# Patient Record
Sex: Female | Born: 1945 | Race: White | Hispanic: No | Smoking: Former smoker
Health system: Southern US, Community
[De-identification: ages and names within clinical notes are randomized; demographics above are authoritative.]

## PROBLEM LIST (undated history)

## (undated) DIAGNOSIS — L309 Dermatitis, unspecified: Secondary | ICD-10-CM

## (undated) DIAGNOSIS — J449 Chronic obstructive pulmonary disease, unspecified: Secondary | ICD-10-CM

## (undated) DIAGNOSIS — R32 Unspecified urinary incontinence: Secondary | ICD-10-CM

## (undated) DIAGNOSIS — E785 Hyperlipidemia, unspecified: Secondary | ICD-10-CM

## (undated) DIAGNOSIS — E039 Hypothyroidism, unspecified: Secondary | ICD-10-CM

## (undated) DIAGNOSIS — I4892 Unspecified atrial flutter: Secondary | ICD-10-CM

## (undated) DIAGNOSIS — I4891 Unspecified atrial fibrillation: Secondary | ICD-10-CM

## (undated) HISTORY — PX: ABDOMINAL HYSTERECTOMY: SHX81

## (undated) HISTORY — PX: OOPHORECTOMY: SHX86

---

## 2021-05-07 ENCOUNTER — Other Ambulatory Visit: Payer: Self-pay

## 2021-05-07 ENCOUNTER — Inpatient Hospital Stay (HOSPITAL_COMMUNITY)
Admission: EM | Admit: 2021-05-07 | Discharge: 2021-05-10 | DRG: 308 | Disposition: A | Discharge status: Home / Self Care | Payer: Medicare Other | Attending: Internal Medicine | Admitting: Internal Medicine

## 2021-05-07 ENCOUNTER — Encounter (HOSPITAL_COMMUNITY): Payer: Self-pay | Admitting: Emergency Medicine

## 2021-05-07 ENCOUNTER — Emergency Department (HOSPITAL_COMMUNITY): Payer: Medicare Other

## 2021-05-07 DIAGNOSIS — W5503XA Scratched by cat, initial encounter: Secondary | ICD-10-CM

## 2021-05-07 DIAGNOSIS — I4819 Other persistent atrial fibrillation: Principal | ICD-10-CM | POA: Diagnosis present

## 2021-05-07 DIAGNOSIS — L309 Dermatitis, unspecified: Secondary | ICD-10-CM | POA: Diagnosis present

## 2021-05-07 DIAGNOSIS — Z825 Family history of asthma and other chronic lower respiratory diseases: Secondary | ICD-10-CM

## 2021-05-07 DIAGNOSIS — A6000 Herpesviral infection of urogenital system, unspecified: Secondary | ICD-10-CM | POA: Diagnosis present

## 2021-05-07 DIAGNOSIS — Z7901 Long term (current) use of anticoagulants: Secondary | ICD-10-CM

## 2021-05-07 DIAGNOSIS — J440 Chronic obstructive pulmonary disease with acute lower respiratory infection: Secondary | ICD-10-CM | POA: Diagnosis present

## 2021-05-07 DIAGNOSIS — E7800 Pure hypercholesterolemia, unspecified: Secondary | ICD-10-CM | POA: Diagnosis present

## 2021-05-07 DIAGNOSIS — I4891 Unspecified atrial fibrillation: Secondary | ICD-10-CM | POA: Diagnosis not present

## 2021-05-07 DIAGNOSIS — K5900 Constipation, unspecified: Secondary | ICD-10-CM | POA: Diagnosis present

## 2021-05-07 DIAGNOSIS — Z20822 Contact with and (suspected) exposure to covid-19: Secondary | ICD-10-CM | POA: Diagnosis present

## 2021-05-07 DIAGNOSIS — J189 Pneumonia, unspecified organism: Secondary | ICD-10-CM

## 2021-05-07 DIAGNOSIS — E785 Hyperlipidemia, unspecified: Secondary | ICD-10-CM | POA: Diagnosis present

## 2021-05-07 DIAGNOSIS — Z79899 Other long term (current) drug therapy: Secondary | ICD-10-CM

## 2021-05-07 DIAGNOSIS — Z880 Allergy status to penicillin: Secondary | ICD-10-CM

## 2021-05-07 DIAGNOSIS — Z888 Allergy status to other drugs, medicaments and biological substances status: Secondary | ICD-10-CM

## 2021-05-07 DIAGNOSIS — K219 Gastro-esophageal reflux disease without esophagitis: Secondary | ICD-10-CM | POA: Diagnosis present

## 2021-05-07 DIAGNOSIS — E039 Hypothyroidism, unspecified: Secondary | ICD-10-CM | POA: Diagnosis present

## 2021-05-07 DIAGNOSIS — Z79890 Hormone replacement therapy (postmenopausal): Secondary | ICD-10-CM

## 2021-05-07 DIAGNOSIS — I10 Essential (primary) hypertension: Secondary | ICD-10-CM | POA: Diagnosis present

## 2021-05-07 DIAGNOSIS — Z8249 Family history of ischemic heart disease and other diseases of the circulatory system: Secondary | ICD-10-CM

## 2021-05-07 DIAGNOSIS — S90812A Abrasion, left foot, initial encounter: Secondary | ICD-10-CM | POA: Diagnosis present

## 2021-05-07 DIAGNOSIS — Z7951 Long term (current) use of inhaled steroids: Secondary | ICD-10-CM

## 2021-05-07 DIAGNOSIS — Z91040 Latex allergy status: Secondary | ICD-10-CM

## 2021-05-07 DIAGNOSIS — I4892 Unspecified atrial flutter: Secondary | ICD-10-CM | POA: Diagnosis present

## 2021-05-07 DIAGNOSIS — I352 Nonrheumatic aortic (valve) stenosis with insufficiency: Secondary | ICD-10-CM | POA: Diagnosis present

## 2021-05-07 DIAGNOSIS — J441 Chronic obstructive pulmonary disease with (acute) exacerbation: Secondary | ICD-10-CM | POA: Diagnosis present

## 2021-05-07 DIAGNOSIS — Z87891 Personal history of nicotine dependence: Secondary | ICD-10-CM

## 2021-05-07 DIAGNOSIS — E559 Vitamin D deficiency, unspecified: Secondary | ICD-10-CM | POA: Diagnosis present

## 2021-05-07 HISTORY — DX: Unspecified atrial flutter: I48.92

## 2021-05-07 HISTORY — DX: Chronic obstructive pulmonary disease, unspecified: J44.9

## 2021-05-07 HISTORY — DX: Unspecified atrial fibrillation: I48.91

## 2021-05-07 LAB — CBC
HCT: 44.5 % (ref 36.0–46.0)
Hemoglobin: 14.1 g/dL (ref 12.0–15.0)
MCH: 29.2 pg (ref 26.0–34.0)
MCHC: 31.7 g/dL (ref 30.0–36.0)
MCV: 92.1 fL (ref 80.0–100.0)
Platelets: 315 10*3/uL (ref 150–400)
RBC: 4.83 MIL/uL (ref 3.87–5.11)
RDW: 14.1 % (ref 11.5–15.5)
WBC: 11.9 10*3/uL — ABNORMAL HIGH (ref 4.0–10.5)
nRBC: 0 % (ref 0.0–0.2)

## 2021-05-07 LAB — BASIC METABOLIC PANEL
Anion gap: 7 (ref 5–15)
BUN: 20 mg/dL (ref 8–23)
CO2: 24 mmol/L (ref 22–32)
Calcium: 9.7 mg/dL (ref 8.9–10.3)
Chloride: 106 mmol/L (ref 98–111)
Creatinine, Ser: 0.74 mg/dL (ref 0.44–1.00)
GFR, Estimated: >60 mL/min (ref >60)
Glucose, Bld: 90 mg/dL (ref 70–99)
Potassium: 5.2 mmol/L — ABNORMAL HIGH (ref 3.5–5.1)
Sodium: 137 mmol/L (ref 135–145)

## 2021-05-07 LAB — TROPONIN I (HIGH SENSITIVITY): Troponin I (High Sensitivity): 13 ng/L (ref <18)

## 2021-05-07 NOTE — ED Triage Notes (Signed)
Pt with hx of afib felt herself go into afib on Thursday. Endorses generalized weakness and shob with exertion.

## 2021-05-08 ENCOUNTER — Emergency Department (HOSPITAL_COMMUNITY): Payer: Medicare Other

## 2021-05-08 DIAGNOSIS — I4819 Other persistent atrial fibrillation: Secondary | ICD-10-CM | POA: Diagnosis not present

## 2021-05-08 DIAGNOSIS — I4891 Unspecified atrial fibrillation: Secondary | ICD-10-CM | POA: Diagnosis present

## 2021-05-08 LAB — CBC WITH DIFFERENTIAL/PLATELET
Abs Immature Granulocytes: 0.05 10*3/uL (ref 0.00–0.07)
Basophils Absolute: 0 10*3/uL (ref 0.0–0.1)
Basophils Relative: 0 %
Eosinophils Absolute: 0 10*3/uL (ref 0.0–0.5)
Eosinophils Relative: 0 %
HCT: 43.6 % (ref 36.0–46.0)
Hemoglobin: 14 g/dL (ref 12.0–15.0)
Immature Granulocytes: 1 %
Lymphocytes Relative: 7 %
Lymphs Abs: 0.7 10*3/uL (ref 0.7–4.0)
MCH: 29 pg (ref 26.0–34.0)
MCHC: 32.1 g/dL (ref 30.0–36.0)
MCV: 90.3 fL (ref 80.0–100.0)
Monocytes Absolute: 0.1 10*3/uL (ref 0.1–1.0)
Monocytes Relative: 1 %
Neutro Abs: 8.6 10*3/uL — ABNORMAL HIGH (ref 1.7–7.7)
Neutrophils Relative %: 91 %
Platelets: 284 10*3/uL (ref 150–400)
RBC: 4.83 MIL/uL (ref 3.87–5.11)
RDW: 14 % (ref 11.5–15.5)
WBC: 9.4 10*3/uL (ref 4.0–10.5)
nRBC: 0 % (ref 0.0–0.2)

## 2021-05-08 LAB — RESP PANEL BY RT-PCR (FLU A&B, COVID) ARPGX2
Influenza A by PCR: NEGATIVE
Influenza B by PCR: NEGATIVE
SARS Coronavirus 2 by RT PCR: NEGATIVE

## 2021-05-08 LAB — BASIC METABOLIC PANEL
Anion gap: 13 (ref 5–15)
BUN: 11 mg/dL (ref 8–23)
CO2: 21 mmol/L — ABNORMAL LOW (ref 22–32)
Calcium: 9.3 mg/dL (ref 8.9–10.3)
Chloride: 104 mmol/L (ref 98–111)
Creatinine, Ser: 0.59 mg/dL (ref 0.44–1.00)
GFR, Estimated: >60 mL/min (ref >60)
Glucose, Bld: 174 mg/dL — ABNORMAL HIGH (ref 70–99)
Potassium: 4.1 mmol/L (ref 3.5–5.1)
Sodium: 138 mmol/L (ref 135–145)

## 2021-05-08 LAB — BRAIN NATRIURETIC PEPTIDE: B Natriuretic Peptide: 106.1 pg/mL — ABNORMAL HIGH (ref 0.0–100.0)

## 2021-05-08 LAB — TSH: TSH: 1.366 u[IU]/mL (ref 0.350–4.500)

## 2021-05-08 LAB — HIV ANTIBODY (ROUTINE TESTING W REFLEX): HIV Screen 4th Generation wRfx: NONREACTIVE

## 2021-05-08 LAB — TROPONIN I (HIGH SENSITIVITY): Troponin I (High Sensitivity): 11 ng/L (ref <18)

## 2021-05-08 LAB — PROCALCITONIN: Procalcitonin: <0.1 ng/mL

## 2021-05-08 MED ORDER — ALBUTEROL (5 MG/ML) CONTINUOUS INHALATION SOLN
2.5000 mg | INHALATION_SOLUTION | RESPIRATORY_TRACT | Status: DC | PRN
  Filled 2021-05-08: qty 20

## 2021-05-08 MED ORDER — FLUTICASONE FUROATE-VILANTEROL 200-25 MCG/INH IN AEPB
1.0000 | INHALATION_SPRAY | Freq: Every day | RESPIRATORY_TRACT | Status: DC
  Administered 2021-05-09 – 2021-05-10 (×2): 1 via RESPIRATORY_TRACT
  Filled 2021-05-08: qty 28

## 2021-05-08 MED ORDER — UMECLIDINIUM BROMIDE 62.5 MCG/INH IN AEPB
1.0000 | INHALATION_SPRAY | Freq: Every day | RESPIRATORY_TRACT | Status: DC
  Administered 2021-05-09 – 2021-05-10 (×2): 1 via RESPIRATORY_TRACT
  Filled 2021-05-08: qty 7

## 2021-05-08 MED ORDER — SODIUM CHLORIDE 0.9 % IV SOLN
500.0000 mg | Freq: Once | INTRAVENOUS | Status: AC
  Administered 2021-05-08: 500 mg via INTRAVENOUS
  Filled 2021-05-08: qty 500

## 2021-05-08 MED ORDER — PANTOPRAZOLE SODIUM 40 MG PO TBEC
40.0000 mg | DELAYED_RELEASE_TABLET | Freq: Every day | ORAL | Status: DC
  Administered 2021-05-08 – 2021-05-10 (×3): 40 mg via ORAL
  Filled 2021-05-08 (×3): qty 1

## 2021-05-08 MED ORDER — LEVOTHYROXINE SODIUM 50 MCG PO TABS
50.0000 ug | ORAL_TABLET | Freq: Every day | ORAL | Status: DC
  Administered 2021-05-08 – 2021-05-09 (×2): 50 ug via ORAL
  Filled 2021-05-08 (×3): qty 1

## 2021-05-08 MED ORDER — DILTIAZEM HCL ER COATED BEADS 240 MG PO CP24
240.0000 mg | ORAL_CAPSULE | Freq: Every day | ORAL | Status: DC
  Administered 2021-05-08 – 2021-05-10 (×3): 240 mg via ORAL
  Filled 2021-05-08 (×3): qty 1

## 2021-05-08 MED ORDER — APIXABAN 5 MG PO TABS: 5.0000 mg | ORAL_TABLET | Freq: Two times a day (BID) | ORAL | Status: DC

## 2021-05-08 MED ORDER — LORAZEPAM 2 MG/ML IJ SOLN
0.5000 mg | Freq: Once | INTRAMUSCULAR | Status: AC
  Administered 2021-05-08: 0.5 mg via INTRAVENOUS
  Filled 2021-05-08: qty 1

## 2021-05-08 MED ORDER — LORAZEPAM 2 MG/ML IJ SOLN: 1.0000 mg | Freq: Once | INTRAMUSCULAR | Status: DC

## 2021-05-08 MED ORDER — ATORVASTATIN CALCIUM 10 MG PO TABS
10.0000 mg | ORAL_TABLET | Freq: Every day | ORAL | Status: DC
  Administered 2021-05-08 – 2021-05-10 (×3): 10 mg via ORAL
  Filled 2021-05-08 (×3): qty 1

## 2021-05-08 MED ORDER — ALBUTEROL SULFATE HFA 108 (90 BASE) MCG/ACT IN AERS
2.0000 | INHALATION_SPRAY | Freq: Once | RESPIRATORY_TRACT | Status: AC
  Administered 2021-05-08: 2 via RESPIRATORY_TRACT
  Filled 2021-05-08: qty 6.7

## 2021-05-08 MED ORDER — SODIUM CHLORIDE 0.9 % IV SOLN
1.0000 g | Freq: Once | INTRAVENOUS | Status: AC
  Administered 2021-05-08: 1 g via INTRAVENOUS
  Filled 2021-05-08: qty 10

## 2021-05-08 MED ORDER — APIXABAN 5 MG PO TABS
5.0000 mg | ORAL_TABLET | Freq: Two times a day (BID) | ORAL | Status: DC
  Administered 2021-05-08 – 2021-05-10 (×5): 5 mg via ORAL
  Filled 2021-05-08 (×6): qty 1

## 2021-05-08 MED ORDER — MONTELUKAST SODIUM 10 MG PO TABS
10.0000 mg | ORAL_TABLET | Freq: Every day | ORAL | Status: DC
  Administered 2021-05-08 – 2021-05-09 (×2): 10 mg via ORAL
  Filled 2021-05-08 (×2): qty 1

## 2021-05-08 MED ORDER — PREDNISONE 20 MG PO TABS
40.0000 mg | ORAL_TABLET | Freq: Once | ORAL | Status: AC
  Administered 2021-05-08: 40 mg via ORAL
  Filled 2021-05-08: qty 2

## 2021-05-08 MED ORDER — IOHEXOL 350 MG/ML SOLN
75.0000 mL | Freq: Once | INTRAVENOUS | Status: AC
  Administered 2021-05-08: 75 mL via INTRAVENOUS

## 2021-05-08 MED ORDER — LOSARTAN POTASSIUM 50 MG PO TABS
50.0000 mg | ORAL_TABLET | Freq: Every day | ORAL | Status: DC
  Administered 2021-05-08 – 2021-05-10 (×3): 50 mg via ORAL
  Filled 2021-05-08 (×3): qty 1

## 2021-05-08 NOTE — ED Notes (Signed)
Pt transported to ct at this time 

## 2021-05-08 NOTE — Evaluation (Signed)
Physical Therapy Evaluation Patient Details Name: Christine Moore MRN: [id number] DOB: Apr 03, 1946 Today's Date: 05/08/2021   History of Present Illness  Pt is a 75 y/o female admitted secondary to SOB and afib. Chest imaging also revealing likely PNA. PMH includes COPD and a fib.  Clinical Impression  Pt admitted secondary to problem above with deficits below. Noted to have unsteadiness with short distance ambulation in room requiring min to min guard A for stability. Pt reports she has also been having issues with navigating curb steps. Feel she would benefit from outpatient PT for balance. Pt also mentioning urinary issues and pelvic health issues that she feels trips to the MD have not helped with. Would also likely benefit from pelvic health PT follow up. Will continue to follow acutely to maximize functional mobility independence and safety.     Follow Up Recommendations Outpatient PT (would benefit from outpatient PT for balance and pelvic health PT)    Equipment Recommendations  3in1 (PT)    Recommendations for Other Services       Precautions / Restrictions Precautions Precautions: Fall Restrictions Weight Bearing Restrictions: No      Mobility  Bed Mobility Overal bed mobility: Needs Assistance Bed Mobility: Supine to Sit;Sit to Supine     Supine to sit: Supervision Sit to supine: Supervision   General bed mobility comments: Supervision for safety and line management.    Transfers Overall transfer level: Needs assistance Equipment used: 1 person hand held assist Transfers: Sit to/from Stand Sit to Stand: Min assist         General transfer comment: Min A for steadying assist to stand from stretcher.  Ambulation/Gait Ambulation/Gait assistance: Min assist;Min guard Gait Distance (Feet): 8 Feet Assistive device: 1 person hand held assist Gait Pattern/deviations: Step-through pattern;Decreased stride length Gait velocity: Decreased   General Gait Details:  Ambulated short distance within ED room. Unsteady and requiring min to min guard A.  Stairs            Wheelchair Mobility    Modified Rankin (Stroke Patients Only)       Balance Overall balance assessment: Needs assistance Sitting-balance support: No upper extremity supported;Feet supported Sitting balance-Leahy Scale: Fair     Standing balance support: Single extremity supported;No upper extremity supported;During functional activity Standing balance-Leahy Scale: Fair Standing balance comment: Able to maintain static standing without UE support                             Pertinent Vitals/Pain Pain Assessment: No/denies pain    Home Living Family/patient expects to be discharged to:: Private residence Living Arrangements: Alone Available Help at Discharge: Family;Available PRN/intermittently Type of Home: House Home Access: Stairs to enter   Entrance Stairs-Number of Steps: 1 threshold Home Layout: One level Home Equipment: Walker - 2 wheels;Walker - 4 wheels;Wheelchair - power      Prior Function Level of Independence: Independent               Hand Dominance        Extremity/Trunk Assessment   Upper Extremity Assessment Upper Extremity Assessment: Defer to OT evaluation    Lower Extremity Assessment Lower Extremity Assessment: Generalized weakness    Cervical / Trunk Assessment Cervical / Trunk Assessment: Normal  Communication   Communication: No difficulties  Cognition Arousal/Alertness: Awake/alert Behavior During Therapy: WFL for tasks assessed/performed Overall Cognitive Status: Within Functional Limits for tasks assessed  General Comments General comments (skin integrity, edema, etc.): Pt with questions about pelvic health PT as she has urinary issues and does not feel MD is helping.    Exercises     Assessment/Plan    PT Assessment Patient needs continued PT  services  PT Problem List Decreased strength;Decreased balance;Decreased activity tolerance;Decreased mobility;Decreased knowledge of use of DME;Decreased knowledge of precautions       PT Treatment Interventions DME instruction;Gait training;Functional mobility training;Stair training;Therapeutic exercise;Therapeutic activities;Balance training;Patient/family education    PT Goals (Current goals can be found in the Care Plan section)  Acute Rehab PT Goals Patient Stated Goal: to feel better PT Goal Formulation: With patient Time For Goal Achievement: 05/22/21 Potential to Achieve Goals: Good    Frequency Min 3X/week   Barriers to discharge        Co-evaluation               AM-PAC PT "6 Clicks" Mobility  Outcome Measure Help needed turning from your back to your side while in a flat bed without using bedrails?: None Help needed moving from lying on your back to sitting on the side of a flat bed without using bedrails?: A Little Help needed moving to and from a bed to a chair (including a wheelchair)?: A Little Help needed standing up from a chair using your arms (e.g., wheelchair or bedside chair)?: A Little Help needed to walk in hospital room?: A Little Help needed climbing 3-5 steps with a railing? : A Little 6 Click Score: 19    End of Session   Activity Tolerance: Patient tolerated treatment well Patient left: in bed;with call bell/phone within reach;with family/visitor present (on stretcher in ED) Nurse Communication: Mobility status PT Visit Diagnosis: Unsteadiness on feet (R26.81);Muscle weakness (generalized) (M62.81)    Time: 4332-9518 PT Time Calculation (min) (ACUTE ONLY): 16 min   Charges:   PT Evaluation $PT Eval Low Complexity: 1 Low          Cindee Salt, DPT  Acute Rehabilitation Services  Pager: [phone] Office: [phone]   Lehman Prom 05/08/2021, 2:52 PM

## 2021-05-08 NOTE — Plan of Care (Signed)
  Problem: Education: Goal: Knowledge of General Education information will improve Description: Including pain rating scale, medication(s)/side effects and non-pharmacologic comfort measures Outcome: Progressing   Problem: Clinical Measurements: Goal: Ability to maintain clinical measurements within normal limits will improve Outcome: Progressing Goal: Will remain free from infection Outcome: Progressing   

## 2021-05-08 NOTE — ED Provider Notes (Signed)
[facility] EMERGENCY DEPARTMENT Provider Note   CSN: [id number] Arrival date & time: 05/07/21  1547     History Chief Complaint  Patient presents with  . Atrial Fibrillation    Christine Moore is a 74 y.o. female.  Christine Moore is a 75 y.o. female with a history of A. fib and COPD, who presents to the emergency department for evaluation of A. fib, fatigue and shortness of breath.  Patient reports she has been under a lot of stress recently, a few months ago she lost her husband and recently had to move here to be with her sister from her home in [place].  She reports she moved last week and with all the stress on Thursday she felt like she flipped into A. fib.  She reports she always feels very unwell when she is in A. fib and has fatigue and shortness of breath.  She reports she has felt irregular heartbeat but has not felt like her heart is racing.  She denies any associated chest pain.  No lightheadedness or syncope.  She does report that she has had a cough but this has been chronic for a while and intermittently productive.  She denies any fevers.  Reports she has had a significant history with A. fib, had a cardiologist that she followed with in [place], has had 4 ablations and has tried 2 different antiarrhythmics but has still intermittently flipped into A. fib, she had been doing well for a while until this recent episode of stress.  The history is provided by the patient.       Past Medical History:  Diagnosis Date  . Atrial fibrillation (HCC)   . Atrial flutter (HCC)   . COPD (chronic obstructive pulmonary disease) (HCC)     There are no problems to display for this patient.      OB History   No obstetric history on file.     No family history on file.     Home Medications Prior to Admission medications   Not on File    Allergies    Penicillins, Quinapril, Formaldehyde, and Other  Review of Systems   Review of Systems   Constitutional: Positive for fatigue. Negative for chills and fever.  HENT: Negative.   Respiratory: Positive for cough and shortness of breath.   Cardiovascular: Negative for chest pain and leg swelling.  Gastrointestinal: Negative for abdominal pain, nausea and vomiting.  Genitourinary: Negative for dysuria and frequency.  Musculoskeletal: Negative for arthralgias and myalgias.  Skin: Negative for color change and rash.  Neurological: Negative for dizziness, syncope, weakness and light-headedness.  All other systems reviewed and are negative.   Physical Exam Updated Vital Signs BP (!) 181/157   Pulse 86   Temp 97.9 F (36.6 C) (Oral)   Resp (!) 32   Ht 5\' 9"  (1.753 m)   Wt 80.3 kg   SpO2 99%   BMI 26.14 kg/m   Physical Exam Vitals and nursing note reviewed.  Constitutional:      General: She is not in acute distress.    Appearance: Normal appearance. She is well-developed. She is not ill-appearing or diaphoretic.     Comments: Well-appearing, no distress  HENT:     Head: Normocephalic and atraumatic.  Eyes:     General:        Right eye: No discharge.        Left eye: No discharge.  Cardiovascular:     Rate and Rhythm:  Normal rate. Rhythm irregular.     Pulses: Normal pulses.     Heart sounds: Normal heart sounds. No murmur heard. No friction rub.     Comments: Irregularly irregular but rate controlled Pulmonary:     Effort: Pulmonary effort is normal. No respiratory distress.     Breath sounds: Wheezing present. No rales.     Comments: Respirations are equal and unlabored, able to speak in full sentences, on auscultation patient has some faint expiratory wheezes, no rales or rhonchi.  Breath sounds slightly diminished Abdominal:     General: Bowel sounds are normal. There is no distension.     Palpations: Abdomen is soft. There is no mass.     Tenderness: There is no abdominal tenderness. There is no guarding.     Comments: Abdomen soft, nondistended, nontender  to palpation in all quadrants without guarding or peritoneal signs  Musculoskeletal:        General: No deformity.     Cervical back: Neck supple.     Right lower leg: No edema.     Left lower leg: No edema.  Skin:    General: Skin is warm and dry.     Capillary Refill: Capillary refill takes less than 2 seconds.  Neurological:     Mental Status: She is alert and oriented to person, place, and time.     Coordination: Coordination normal.     Comments: Speech is clear, able to follow commands Moves extremities without ataxia, coordination intact  Psychiatric:        Mood and Affect: Mood normal.        Behavior: Behavior normal.     ED Results / Procedures / Treatments   Labs (all labs ordered are listed, but only abnormal results are displayed) Labs Reviewed  BASIC METABOLIC PANEL - Abnormal; Notable for the following components:      Result Value   Potassium 5.2 (*)    All other components within normal limits  CBC - Abnormal; Notable for the following components:   WBC 11.9 (*)    All other components within normal limits  RESP PANEL BY RT-PCR (FLU A&B, COVID) [id number]  BRAIN NATRIURETIC PEPTIDE  TROPONIN I (HIGH SENSITIVITY)  TROPONIN I (HIGH SENSITIVITY)    EKG EKG Interpretation  Date/Time:  Tuesday May 07 2021 15:53:04 EDT Ventricular Rate:  75 PR Interval:    QRS Duration: 88 QT Interval:  396 QTC Calculation: 442 R Axis:   86 Text Interpretation: Atrial fibrillation Abnormal QRS-T angle, consider primary T wave abnormality Abnormal ECG No old tracing to compare Confirmed by Drema Pryardama, Pedro [phone]([id number]) on 05/08/2021 2:04:02 AM   Radiology DG Chest 2 View  Result Date: 05/07/2021 CLINICAL DATA:  Shortness of breath. EXAM: CHEST - 2 VIEW COMPARISON:  None. FINDINGS: The heart size and mediastinal contours are within normal limits. No pneumothorax or pleural effusion is noted. Mild bibasilar subsegmental atelectasis is noted. Rounded density is seen in right infrahilar  region concerning for possible mass or neoplasm. The visualized skeletal structures are unremarkable. IMPRESSION: Mild bibasilar subsegmental atelectasis. Rounded density seen in right infrahilar region concerning for possible neoplasm or mass. CT scan of the chest with intravenous contrast is recommended. Electronically Signed   By: Lupita RaiderJames  Green Jr M.D.   On: 05/07/2021 16:29   CT Angio Chest PE W and/or Wo Contrast  Result Date: 05/08/2021 CLINICAL DATA:  Chest pain and shortness of breath. EXAM: CT ANGIOGRAPHY CHEST WITH CONTRAST TECHNIQUE: Multidetector CT imaging of the  chest was performed using the standard protocol during bolus administration of intravenous contrast. Multiplanar CT image reconstructions and MIPs were obtained to evaluate the vascular anatomy. CONTRAST:  27mL OMNIPAQUE IOHEXOL 350 MG/ML SOLN COMPARISON:  None. FINDINGS: Cardiovascular: Satisfactory opacification of the pulmonary arteries to the segmental level. No evidence of pulmonary embolism. Enlarged heart size. No pericardial effusion. 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 Mediastinum/Nodes: No enlarged mediastinal, hilar, or axillary lymph nodes. Thyroid gland, trachea, and esophagus demonstrate no significant findings. Shotty mediastinal lymph, sub pathologic by CT criteria. Lungs/Pleura: No evidence of pleural effusion or pneumothorax. 4 mm right upper lobe perifissural pulmonary nodule, image 51/101, lung windows. Patchy bilateral lower lobe airspace consolidation, more confluent in the left lower lobe. Consolidation versus atelectasis in the medial segment of the right middle lobe. More subtle scattered ground-glass opacities present in the upper lobes. Upper Abdomen: No acute abnormality. Musculoskeletal: No chest wall abnormality. No acute or significant osseous findings. Review of the MIP images confirms the above findings. IMPRESSION: 1. No evidence of pulmonary embolus. 2. Patchy bilateral lower  lobe airspace consolidation, more confluent in the left lower lobe. Consolidation versus atelectasis in the medial segment of the right middle lobe. More subtle scattered ground-glass opacities in the upper lobes. These findings may represent multifocal pneumonia. 3. 4 mm right upper lobe perifissural pulmonary nodule. No follow-up needed if patient is low-risk. Non-contrast chest CT can be considered in 12 months if patient is high-risk. This recommendation follows the consensus statement: Guidelines for Management of Incidental Pulmonary Nodules Detected on CT Images: From the Fleischner Society 2017; Radiology 2017; 284:228-243. 4. Enlarged heart size. 5. 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 Aortic Atherosclerosis (ICD10-I70.0). Electronically Signed   By: Ted Mcalpine M.D.   On: 05/08/2021 09:32    Procedures Procedures   Medications Ordered in ED Medications  apixaban (ELIQUIS) tablet 5 mg (has no administration in time range)  diltiazem (CARDIZEM CD) 24 hr capsule 240 mg (has no administration in time range)  albuterol (PROVENTIL,VENTOLIN) solution continuous neb (has no administration in time range)  fluticasone furoate-vilanterol (BREO ELLIPTA) 200-25 MCG/INH 1 puff (has no administration in time range)  umeclidinium bromide (INCRUSE ELLIPTA) 62.5 MCG/INH 1 puff (has no administration in time range)  predniSONE (DELTASONE) tablet 40 mg (40 mg Oral Given 05/08/21 0748)  albuterol (VENTOLIN HFA) 108 (90 Base) MCG/ACT inhaler 2 puff (2 puffs Inhalation Given 05/08/21 0748)  iohexol (OMNIPAQUE) 350 MG/ML injection 75 mL (75 mLs Intravenous Contrast Given 05/08/21 0832)  LORazepam (ATIVAN) injection 0.5 mg (0.5 mg Intravenous Given 05/08/21 0747)  cefTRIAXone (ROCEPHIN) 1 g in sodium chloride 0.9 % 100 mL IVPB (0 g Intravenous Stopped 05/08/21 1032)  azithromycin (ZITHROMAX) 500 mg in sodium chloride 0.9 % 250 mL IVPB (0 mg Intravenous Stopped 05/08/21 1159)    ED  Course  I have reviewed the triage vital signs and the nursing notes.  Pertinent labs & imaging results that were available during my care of the patient were reviewed by me and considered in my medical decision making (see chart for details).    MDM Rules/Calculators/A&P                         75 y.o. female presents to the ED with complaints of A. fib, shortness of breath and fatigue, this involves an extensive number of treatment options, and is a complaint that carries with it a high risk of complications and  morbidity.  The differential diagnosis includes A. fib, CHF, pulmonary edema, pneumonia, COVID, ACS, PE, COPD  On arrival pt is nontoxic, vitals reassuring, patient is hypertensive, EKG consistent with A. fib but rate controlled.  On exam patient with some faint scattered wheezing noted.  No lower extremity edema.  Additional history obtained from medical records. Previous records obtained and reviewed EMR  Lab Tests:  I Ordered, reviewed, and interpreted labs, which included:  CBC: Mild leukocytosis, normal hemoglobin BMP: Minimally elevated potassium of 5.2, all other electrolytes unremarkable, normal renal function Troponin: Negative x2 BNP: No significant elevation at 106.1 COVID/flu: Negative  Imaging Studies ordered:  I ordered imaging studies which included CXR, CT PE Study, I independently visualized and interpreted imaging which showed: CXR- Mild bibasilar subsegmental atelectasis. Rounded density seen in right infrahilar region concerning for possible neoplasm or mass. CT with contrast recommended for further eval  CTA - No evidence of pulmonary embolus. Patchy bilateral lower lobe airspace consolidation, more confluent in the left lower lobe. Consolidation versus atelectasis in the medial segment of the right middle lobe. More subtle scattered ground-glass opacities in the upper lobes. These findings may represent multifocal pneumonia. 4 mm right upper lobe perifissural  pulmonary nodule  Will initiate antibiotics for multifocal pneumonia, this could have triggered patient to flip back into A. fib in addition to current stressors.  ED Course:   Critical interventions: IV antibiotics for multifocal pneumonia  Patient symptomatic with A. fib, fortunately currently rate controlled, known history of A. fib but has had difficulty with cardioversion previously, history of 4 prior ablations and multiple antiarrhythmics.  Felt like she flipped back into it on Thursday and is very symptomatic when in A. fib.  Also found to have multifocal pneumonia which could also be attributing to shortness of breath and fatigue.  Feel patient will need IV antibiotics for pneumonia and given A. fib that has been difficult to control previously will have cardiology see patient.  Discussed case with cardiology, they will have someone with EP evaluate patient while admitted  I consulted and discussed lab and imaging findings with internal medicine teaching service, they will see and admit the patient.   Portions of this note were generated with Scientist, clinical (histocompatibility and immunogenetics). Dictation errors may occur despite best attempts at proofreading.   Final Clinical Impression(s) / ED Diagnoses Final diagnoses:  Multifocal pneumonia  Atrial fibrillation, unspecified type [facility])    Rx / DC Orders ED Discharge Orders    None       Legrand Rams 05/08/21 1307    Margarita Grizzle, MD 05/08/21 1325

## 2021-05-08 NOTE — H&P (Addendum)
Date: 05/08/2021               Patient Name:  Christine Moore MRN: [id number]  DOB: [birth year] Age / Sex: 75 y.o., female   PCP: Pcp, No              Medical Service: Internal Medicine Teaching Service              Attending Physician: Dr. Inez CatalinaMullen, Emily B, MD    First Contact: Janeal HolmesBranden Mabe, MS3 Pager: [phone]  Second Contact: Dr. Karsten RoVandana Doda Pager: [phone]  Third Contact Dr. Thurmon FairJeff Steen Pager: [phone]        After Hours (After 5p/  First Contact Pager: [phone]  weekends / holidays): Second Contact Pager: [phone]   Chief Complaint: Fatigue, weakness, and dyspnea on exertion  History of Present Illness:  Christine Moore is a 75 y.o. female who presents with progressive fatigue, weakness, and dyspnea on exertion since last Saturday. She has also noticed a cough that is productive of sputum. She mentioned she was in her usual state of health until that time. She does not recall any inciting event. On Sunday, she went to urgent care for evaluation of these symptoms. She was told that she was having a COPD exacerbation as well as symptoms due to her atrial fibrilliation. Since her visit, she has had worsening symptoms of dyspnea and fatigue which prompted her to come to ED for further evaluation.     She mentions that she has had intermittent episodes of fatigue and weakness about twice a week for decades due to history of atrial fibrillation and COPD. These episodes tend to happen twice per week and "put her out" in bed all day. She also states she has had multiple episodes of pneumonia in her life. She mentions that her husband passed away in March. [place] this weekend to be with her sister and also due to high cost of living without her husband. She mentions undergoing a lot of stress due to these events which appeared to have exacerbated her A.fib symptoms.   She describes a history of multiple interventions for A.fib including multiple ablations and  cardioversions. She mentions having improvement in her symptoms over the last year until these recent changes. She denies any fever, chest pain, nausea, vomiting, constipation, or diarrhea out of her normal bowel habits. She does endorse some chilling and feeling cold. There have been no sick contacts that she is aware of. She does not have a PCP here in [place] since she just moved here.  Meds: Levothyroxine 50mcg daily Trelegy ellipta 200-62.5-25mcg 1 puff daily Acyclovir 400mg  PRN Atorvastatin 10mg  qhs Eliquis 5mg  BID Losartan 50mg  daily Montelukast 10mg  daily Diltiazem 24hr 240mg  daily Pantoprazole 40mg  daily Xopenex 1-2 puffs PRN Vitamin D 800 units BID Lutein-zeasanthin 25-5mg  daily Chlorpheniramine 4mg  qhs Clobetasol 0.05% BID PRN Psyllium 2g BID  Allergies: Allergies as of 05/07/2021 - Review Complete 05/07/2021  Allergen Reaction Noted  . Penicillins Swelling 05/16/2011  . Quinapril Swelling 05/16/2011  . Formaldehyde Itching 03/02/2018  . Other Rash 08/16/2018  Latex, itching  Past Medical History:  Diagnosis Date  . Atrial fibrillation (HCC)   . Atrial flutter (HCC)   . COPD (chronic obstructive pulmonary disease) (HCC)   Eczema Asthma Herpes simplex Hypertension Hyperlipidemia Vitamin D deficiency GERD  Past Surgical History Four cardiac ablations Cataract surgery "Ear surgery"  Family History: Mother: deceased at 4484 from asthma complications, asthma, several heart attacks, diabetes, high  blood pressure, high cholesterol. Father: deceased at 8 from AAA, no other known health problems Older sister: deceased from melanoma in 81s, ovarian cancer diagnosed in 71s and melanoma diagnosed in 32s Second sister: living in 105s, breast cancer diagnosed in 13s, peritoneal cancer diagnosed in 32s Third sister: living in 49s, cervical cancer diagnosed in 56s  Social History: Patient recently moved from senior living facility at [facility]. Husband passed away  March 10, 2023. Currently living alone at home but lives near her sister and brother-in-law. Currently retired. Used to work as a Psychologist, forensic in social studies / Nurse, children's. Does knitting in her free time. Drinks about 2-3 drinks ([place] + Ginger ale) daily. Used to smoke 1.5 pack daily for 15-20 years but quit 30 years prior. Denies any prior illicit substance use. Able to perform ADLs, IADLs at home. She reports no barriers to obtaining or using her medications.  Review of Systems: On review of systems, she denies any fevers. Does endorse chills. Eyesight worse after cataract surgery. Her hearing has been stable with hearing aids. She denies any chest pain. She denies any abdominal pain, nausea, vomiting, constipation. She endorses diarrhea that is unchanged from usual. She denies any dysuria. Does endorse urinary frequency that has been ongoing for couple years. Mentions some chronic issue with "balance." She endorses generalized weakness. Does endorse solar purpura diffusely over time.   Physical Exam: Blood pressure (!) 154/72, pulse 75, temperature 97.9 F (36.6 C), temperature source Oral, resp. rate (!) 25, height 5\' 9"  (1.753 m), weight 80.3 kg, SpO2 95 %.  GEN: well developed, laying in bed in NAD, sister at bedside HEAD: NCAT, neck supple  EENT: PERRL, prominent brow ridge, moderate ptosis of the L eye, MMM without erythema, lesions, or exudates CVS: Irregularly irregular rhythm, normal S1/S2, no murmurs, rubs, gallops  RESP: Breathing comfortably on RA, inspiratory wheezes in bilateral anterior lung fields, decreased breath sounds in inferior posterior lung fields ABD: Soft, non-tender, no organomegaly or masses SKIN: Solar purpura present on bilateral upper extremities and back diffusely. Scratch with overlying scab and mild bordering erythema is present on left medial calcaneous. NEU:CN II-XII intact, strength 5/5 and sensation normal throughout, +2 patellar and biceps  reflexes, heel-to-shin and finger-to-nose exams normal. PSY: Mood and affect normal. Thought content and behavior normal.  EKG: personally reviewed my interpretation is irregularly irregular rhythm with sawtooth waves consistent with atrial flutter  CXR Impression:  Mild bibasilar subsegmental atelectasis. Rounded density seen in right infrahilar region concerning for possible neoplasm or mass. CT scan of the chest with intravenous contrast is recommended.  CTA Chest Impression:  1. No evidence of pulmonary embolus. 2. Patchy bilateral lower lobe airspace consolidation, more confluent in the left lower lobe. Consolidation versus atelectasis in the medial segment of the right middle lobe. More subtle scattered ground-glass opacities in the upper lobes. These findings may represent multifocal pneumonia. 3. 4 mm right upper lobe perifissural pulmonary nodule. No follow-up needed if patient is low-risk. Non-contrast chest CT can be considered in 12 months if patient is high-risk. This recommendation follows the consensus statement: Guidelines for Management of Incidental Pulmonary Nodules Detected on CT Images: From the Fleischner Society 2017; Radiology 2017; 284:228-243. 4. Enlarged heart size. 5. 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  Assessment & Plan by Problem: Active Problems:   Atrial fibrillation (HCC)  1. COPD exacerbation, history of asthma Patient has experienced progressive DOE, cough with sputum production, fatigue, and weakness over the last couple of  days. She states she has had many episodes like this before over the last decade. She takes Trelegy, xopenex, montelukast at home. She is afebrile and in NAD on exam. CXR demonstrates mild bibasilar subsegmental atelectasis. CTA demonstrates patchy bilateral lower lobe consolidation, consolidation vs atelectasis in right middle lobe, scattered ground-glass opacities in upper lobes, and right  upper lobe nodule. Past CXR and CT of the chest yielded similar findings besides the right upper lobe nodule. Given the chronicity of these symptoms and similar imaging, suspect presentation is an exacerbation of existing COPD. Because of her 30 pack year smoking history and pulmonary nodule findings, will recommend outpatient follow up within 12 months for further evaluation. -Albuterol neubulizer 2.5 mg Q4H for wheezing, SOB -Fluticasone furoate-vilanterol 200-25 mcg one puff daily -Umeclidinium bromide 62.5 mcg one puff daily -Montelukast 10 mg daily -2L O2 nasal cannula -Incentive spirometry  2. Persistent Atrial fibrillation Patient has history of atrial fibrillation and takes diltiazem, eliqiuis at home. Her ECG demonstrates active atrial flutter. Irregularly irregular rhythm on exam. She noted multiple episodes of symptomatic A.fib over the last decade. She does not have a cardiologist here since she just moved. Will get cardiology consult for further recommendations. -Continue diltiazem 240 mg daily -Continue eliquis 5 mg daily -Consult cardiology -Cardiac diet  3. Cat scratch Patient reports current cat scratch on her left medial calcaneous that is healing. She has been taking Keflex for the scratch. Keflex was stopped after starting Rocephin for presumed pneumonia due to adequate coverage. Antibiotics have been stopped due to likely COPD exacerbation. It appears to be healing so will discontinue antibiotic therapy completely. -Discontinue Rocephin -Monitor  4. Balance concerns Patient endorses balance concerns at home. She overall says she has no difficulty completing IADLs and ADLs, however. She lives near her sister and brother-in-law and has a good support system here in [place]. Given age and possibility of falls with balance issues, will consult PT/OT. -PT/OT evaluation  5. 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 She takes acyclovir at home. -Continue  acyclovir 400 mg 3 times daily prn  6. HLD Patient reports taking atorvastatin at home for high cholesterol. -Continue atorvastatin 10 mg daily  7. Seasonal allergies Patient states she takes chlorpheniramine for allergies. -Continue chlorpheniramine 4 mg BID prn  8. Vitamin D deficiency Patient takes cholecalciferol 10 mcg/400 units BID -Continue supplement  9. Eczema Patient reports history of eczema and takes clobetasol ointment. -Continue clobetasol 0.05% daily prn  10. Hypothyroidism Patient states she takes levothyroxine daily. Dose was originally written for 75 mcg; however, she states that it made her feel "jittery" and now only takes 50 mcg daily. -Continue levothyroxine  11. Hypertension Patient takes losartan 50 mg daily for hypertension. -Continue losartan  12. Constipation Patient takes fiber supplements to help bowels. -Continue calcium polycarbophil 1 capsule daily  13. GERD Patient is taking pantoprazole at home. -Continue pantoprazole 40 mg daily  14. History of cataracts Patient takes lutein-zeaxanthin 25-5 mg capsules for her eyesight. -Continue supplement  Dispo: Admit patient to Observation with expected length of stay less than 2 midnights.  Signed: Samantha Moore, Medical Student 05/08/2021, 11:25 AM  Pager: [phone] After 5pm on weekdays and 1pm on weekends: On Call pager [phone]  Attestation for Student Documentation I personally was present and performed or re-performed the history, physical exam and medical decision-making activities of this service and have verified that the service and findings are accurately documented in the student's note  Christine Moore is a 75  yo F w/ PMH of hypothyroidism, persistent A.fib/flutter, COPD/Asthma and eczema presenting to [facility] w/ subjective fatigue. Patient feels this is due to her A.fib but hr in 60s, bp stable. hsTrop negative. BNP close to normal levels. Chest imaging suggest possible pneumonia but chart  review shows these are chronic findings. No desaturation event or significant wheezing to suggest COPD exacerbation. Suspect non-organic cause for her symptoms but will admit for observation while undergoing work-up for CHF, bacterial pneumonia and hypothyroidism.  -Judeth Cornfield, PGY3 Pager: [phone]

## 2021-05-08 NOTE — ED Notes (Signed)
Provider at bedside

## 2021-05-08 NOTE — Consult Note (Addendum)
Cardiology Consultation:   Patient ID: Christine Moore MRN: [id number]; DOB: 06/25/1946  Admit date: 05/07/2021 Date of Consult: 05/08/2021  PCP:  Oneita Hurt No   [facility] Providers Cardiologist:  New to [facility]  Previously: Loney Loh, MD  [address]  [place], [place] [address]   Patient Profile:   Christine Moore is a 74 y.o. female with a hx of paroxysmal atrial fibrillation/flutter, HTN, HLD and COPD/asthma who is being seen 05/08/2021 for the evaluation of afib at the request of Dr. Criselda Peaches.   Per primary cardiologist note "history of symptomatic paroxysmal and persistent atrial fibrillation and has had 3 ablations: [facility], pulmonary vein cryoablation June 2015 by Dr Katrinka Blazing, and 3rd ablation in April of 2019 with surgical posterior wall left atrial ablation, endocardial repeat pulmonary vein isolation and tricuspid isthmus ablation (in the setting of flecainide therapy, she had documented typical atrial flutter). In July, 2019 she presented with symptomatic atypical atrial flutter/atrial tachycardia, requiring cardioversion. She had some side effects of worsening reflux symptoms on flecainide, and was transitioned to propafenone." Since that time her propafenone was discontinued due to recurrence of atrial flutter while on this medication, and Dr. Katrinka Blazing was concerned that she might develop permanent atrial fibrillation may need pacemaker +/-AV node ablation. However in June of 2021 she were cardiac monitor that showed normal sinus rhythm and no further significant runs of atrial fibrillation".  Last echo 03/2018 showed normal LVEF, moderate MR and moderate AR.   Patient was in rate controlled atrial flutter when seen last 01/09/2021.   History of Present Illness:   Christine Moore is in lots of stress for post 6 months due to declining health of her husband who eventually passed away on March 02, 2023.  She finally decided to  move closer to her sister.  Last week patient noted unusual fatigue and more than usual cough.  She knew she was out of rhythm and noted irregular pulse.  However, she did not pay much attention as she was busy packing her stuff and eventually moved to [place], [place] last Friday.  Her symptoms persisted and evaluated at urgent care.  She was diagnosed with COPD exacerbation and started on Keflex for cat scratch. However, symptoms worsen with productive cough and intermittent chills without fever.  Reported exertional chest pressure and shortness of breath which were her prior symptoms when she goes out of rhythm.  Due to persistent symptoms she presented to emergency from yesterday around 4 PM and seen this morning. She has missed PM dose of Eliquis while waiting in ER.   Hs-troponin 13>>11 BNP 106 K 5.2 Scr 1.74 Respiratory panel negative for COVID and influenza   Chest X-ray Mild bibasilar subsegmental atelectasis. Rounded density seen inright infrahilar region concerning for possible neoplasm or mass. CT scan of the chest with intravenous contrast is recommended.  CTA of chest without PE but likely multifocal pneumonia. 4 mm right upper lobe perifissural pulmonary nodule. 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   Past Medical History:  Diagnosis Date  . Atrial fibrillation (HCC)   . Atrial flutter (HCC)   . COPD (chronic obstructive pulmonary disease) (HCC)     Inpatient Medications: Scheduled Meds: . apixaban  5 mg Oral BID  . diltiazem  240 mg Oral Daily  . fluticasone furoate-vilanterol  1 puff Inhalation Daily  . umeclidinium bromide  1 puff Inhalation Daily   Continuous Infusions:  PRN Meds: albuterol  Allergies:    Allergies  Allergen Reactions  . Penicillins Swelling    Unable to recall  Swelling not throat/face   . Quinapril Swelling    Swelling of face Swelling of face   . Latex Itching  . Formaldehyde Itching  . Other  Rash    Social History:   Social History   Socioeconomic History  . Marital status: Widowed    Spouse name: Not on file  . Number of children: Not on file  . Years of education: Not on file  . Highest education level: Not on file  Occupational History  . Not on file  Tobacco Use  . Smoking status: Not on file  . Smokeless tobacco: Not on file  Substance and Sexual Activity  . Alcohol use: Not on file  . Drug use: Not on file  . Sexual activity: Not on file  Other Topics Concern  . Not on file  Social History Narrative  . Not on file   Social Determinants of Health   Financial Resource Strain: Not on file  Food Insecurity: Not on file  Transportation Needs: Not on file  Physical Activity: Not on file  Stress: Not on file  Social Connections: Not on file  Intimate Partner Violence: Not on file    Family History:   From Care Everywhere Coronary artery disease Mother  . Heart attack Mother  3060s  . Melanoma Sister  . Colon polyps Father  . Colon cancer Neg Hx    ROS:  Please see the history of present illness.  All other ROS reviewed and negative.     Physical Exam/Data:   Vitals:   05/08/21 0117 05/08/21 0405 05/08/21 0615 05/08/21 0915  BP: (!) 147/63 (!) 158/86 (!) 181/157 (!) 154/72  Pulse: (!) 50 70 86 75  Resp: 17 20 (!) 32 (!) 25  Temp:  97.9 F (36.6 C)    TempSrc:  Oral    SpO2: 95% 96% 99% 95%  Weight:      Height:       No intake or output data in the 24 hours ending 05/08/21 1326 Last 3 Weights 05/07/2021  Weight (lbs) 177 lb  Weight (kg) 80.287 kg     Body mass index is 26.14 kg/m.  General:  Well nourished, well developed, in no acute distress HEENT: normal Lymph: no adenopathy Neck: no JVD Endocrine:  No thryomegaly Vascular: No carotid bruits; FA pulses 2+ bilaterally without bruits  Cardiac:  normal S1, S2; irregular, murmur Lungs:  clear to auscultation bilaterally, no wheezing, rhonchi or rales  Abd: soft, nontender, no  hepatomegaly  Ext: no edema Musculoskeletal:  No deformities, BUE and BLE strength normal and equal Skin: warm and dry  Neuro:  CNs 2-12 intact, no focal abnormalities noted Psych:  Normal affect   EKG:  The EKG was personally reviewed and demonstrates:  Atrial fibrillation  Telemetry:  Telemetry was personally reviewed and demonstrates:  Atrial fibrillation at controlled ventricular rate   Relevant CV Studies:  Monitor 05/2020 Normal sinus rhythm during monitor period.   2. Normal mean heart rate. Normal heart rate variability.   3. Rare PAC's and on 6 beat atrial run. Rare monomoprhic PVC's, occasional ventricular bigeminy. One 14 beat ventricular run with a cycle length greater than 400 msec. No sustained ectopy present. No atrial fibrillation or atrial flutter   - Loney LohWilliam T. Smith IV, MD, [facility] Hospital[facility]ACC, CCDS, 05/17/2020, 09:35   Monitor 03/2020 Atrial flutter is underlying rhythm during entire monitor  period.   2. Upper limit of normal mean heart rate, 91 beats per minute. Normal heart rate variability. Periods of elevated mean heart rate greater than 100 beats per minute during daytime hours.   3. Rare PVC's. No sustained ventricular ectopy present.   Loney Loh, MD, [facility], CCDS, 04/16/2020, 17:26  Echo 03/2018 Findings  Mitral Valve  The mitral valve appears structurally normal.  There is moderate mitral regurgitation.  Aortic Valve  The aortic valve is trileaflet.  Moderate sclerosis of the trileaflet aortic valve, with adequate leaflet  motion.  The aortic valve is mildly calcified.  There is no evidence of significant aortic stenosis.  Moderate aortic regurgitation is present.  Tricuspid Valve  The tricuspid valve appears normal.  There is mild tricuspid regurgitation present.  Pulmonic Valve  There is mild pulmonary regurgitation.  Left Atrium  There is normal left atrial size.  Left Ventricle  There is normal left ventricular size  and systolic function.  The estimated ejection fraction is greater than 60%.  Right Atrium  There is normal right atrial size.  Right Ventricle  There is normal right ventricular size and function.  Pericardium  There is no evidence of pericardial effusion.  Interatrial Septum  The interatrial septum is not well visualized, but appears to be grossly  intact.  Miscellaneous  The aortic root is normal in size.  The IVC is normal in size and demonstrates normal inspiratory collapse.  Conclusions  Summary  There is normal left ventricular size and systolic function.  Estimated overall ejection fraction is greater than 60%.  Moderate mitral regurgitation.  Degenerative changes of aortic valve with moderate aortic regurgitation  present.  There is no evidence of pericardial effusion.   Laboratory Data:  High Sensitivity Troponin:   Recent Labs  Lab 05/07/21 1603 05/08/21 0104  TROPONINIHS 13 11     Chemistry Recent Labs  Lab 05/07/21 1603  NA 137  K 5.2*  CL 106  CO2 24  GLUCOSE 90  BUN 20  CREATININE 0.74  CALCIUM 9.7  GFRNONAA >60  ANIONGAP 7    Hematology Recent Labs  Lab 05/07/21 1603  WBC 11.9*  RBC 4.83  HGB 14.1  HCT 44.5  MCV 92.1  MCH 29.2  MCHC 31.7  RDW 14.1  PLT 315   BNP Recent Labs  Lab 05/08/21 0636  BNP 106.1*    Radiology/Studies:  DG Chest 2 View  Result Date: 05/07/2021 CLINICAL DATA:  Shortness of breath. EXAM: CHEST - 2 VIEW COMPARISON:  None. FINDINGS: The heart size and mediastinal contours are within normal limits. No pneumothorax or pleural effusion is noted. Mild bibasilar subsegmental atelectasis is noted. Rounded density is seen in right infrahilar region concerning for possible mass or neoplasm. The visualized skeletal structures are unremarkable. IMPRESSION: Mild bibasilar subsegmental atelectasis. Rounded density seen in right infrahilar region concerning for possible neoplasm or mass. CT scan of the  chest with intravenous contrast is recommended. Electronically Signed   By: Lupita Raider M.D.   On: 05/07/2021 16:29   CT Angio Chest PE W and/or Wo Contrast  Result Date: 05/08/2021 CLINICAL DATA:  Chest pain and shortness of breath. EXAM: CT ANGIOGRAPHY CHEST WITH CONTRAST TECHNIQUE: Multidetector CT imaging of the chest was performed using the standard protocol during bolus administration of intravenous contrast. Multiplanar CT image reconstructions and MIPs were obtained to evaluate the vascular anatomy. CONTRAST:  47mL OMNIPAQUE IOHEXOL 350 MG/ML SOLN COMPARISON:  None. FINDINGS: Cardiovascular: Satisfactory opacification  of the pulmonary arteries to the segmental level. No evidence of pulmonary embolism. Enlarged heart size. No pericardial effusion. 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 Mediastinum/Nodes: No enlarged mediastinal, hilar, or axillary lymph nodes. Thyroid gland, trachea, and esophagus demonstrate no significant findings. Shotty mediastinal lymph, sub pathologic by CT criteria. Lungs/Pleura: No evidence of pleural effusion or pneumothorax. 4 mm right upper lobe perifissural pulmonary nodule, image 51/101, lung windows. Patchy bilateral lower lobe airspace consolidation, more confluent in the left lower lobe. Consolidation versus atelectasis in the medial segment of the right middle lobe. More subtle scattered ground-glass opacities present in the upper lobes. Upper Abdomen: No acute abnormality. Musculoskeletal: No chest wall abnormality. No acute or significant osseous findings. Review of the MIP images confirms the above findings. IMPRESSION: 1. No evidence of pulmonary embolus. 2. Patchy bilateral lower lobe airspace consolidation, more confluent in the left lower lobe. Consolidation versus atelectasis in the medial segment of the right middle lobe. More subtle scattered ground-glass opacities in the upper lobes. These findings may represent multifocal  pneumonia. 3. 4 mm right upper lobe perifissural pulmonary nodule. No follow-up needed if patient is low-risk. Non-contrast chest CT can be considered in 12 months if patient is high-risk. This recommendation follows the consensus statement: Guidelines for Management of Incidental Pulmonary Nodules Detected on CT Images: From the Fleischner Society 2017; Radiology 2017; 284:228-243. 4. Enlarged heart size. 5. 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 Aortic Atherosclerosis (ICD10-I70.0). Electronically Signed   By: Ted Mcalpine M.D.   On: 05/08/2021 09:32     Assessment and Plan:   1. Symptomatic paroxysmal atrial fibrillation/flutter -Patient with prior multiple cardioversion, ablations and failed antiarrhythmics (Tikosyn, flecainide and propafenone).  Patient reports she has done well since last ablation in 2019.  However, went into atrial fibrillation last week.  She can tell when he goes out of rhythm.  Also noted more than usual shortness of breath and cough. -Current A. fib episode could be due to stress versus COPD exacerbation + pneumonia -Heart rate is controlled.  Unfortunately, she missed her Eliquis dose last night while waiting in ER.  She hasn't got her morning dose as well. -Unable to offer cardioversion due to missed Eliquis dose -Continue Eliquis (will give dose now, order changed) -Continue Cardizem - Pending echo - Check TSH   2. HTN - Minimally elevated. Follow on home Cardizem CD .   3. Valvular heart dx - Last echo 03/2018 showed normal LVEF, moderate MR and moderate AR.   4. Multifocal pneumonia/ COPD exacerbation - Per admitting team  5. Hypothyroidism   Risk Assessment/Risk Scores:    CHA2DS2-VASc Score = 5  {This indicates a 7.2% annual risk of stroke. The patient's score is based upon: CHF History: No HTN History: Yes Diabetes History: No Stroke History: No Vascular Disease History: Yes (Calcific atherosclerotic  disease of the aorta and coronary arteries on CTA of chest) Age Score: 2 Gender Score: 1   For questions or updates, please contact [facility] Please consult [email or web address] for contact info under    Lorelei Pont, [place]  05/08/2021 1:26 PM   Attending Note:   The patient was seen and examined.  Agree with assessment and plan as noted above.  Changes made to the above note as needed.  Patient seen and independently examined with  Christine Bhagat,PA .   We discussed all aspects of the encounter. I agree with the assessment and plan as stated above.  1.  Persistent atrial fibrillation: Christine Moore presents to the hospital with fatigue, cough, and recurrent atrial fibrillation.  She suspects that she went back into atrial fibrillation a week ago when she developed profound fatigue.  This is her typical symptom.  Of note is that she also has a right lower lobe pneumonia.  She presented to the emergency room last night but was not seen until this morning.  As a result she missed 2 doses of Eliquis.  She has had 4 different ablations, multiple cardioversions, and has failed multiple antiarrhythmics.  She does not recall ever trying amiodarone.  I think our best option at this point is to restart her Eliquis.  Since she tolerates it so poorly we will go ahead and do a transesophageal echo/cardioversion during this admission.  I would like for her to receive several doses of and IV antibiotics to treat her pneumonia before we attempt this so we will schedule this for Friday. Anticipate getting her into the A. fib clinic and likely she will need to be seen by one of our electrophysiologist at some point.  We discussed the risk, benefits, options.  She understands and agrees to proceed.  2.  Pneumonia: She has a right lower lobe pneumonia.  Further plans per internal medicine team.     I have spent a total of 40 minutes with patient reviewing hospital  notes , telemetry, EKGs, labs and  examining patient as well as establishing an assessment and plan that was discussed with the patient. > 50% of time was spent in direct patient care.    Vesta Mixer, Montez Hageman., MD, [facility] 05/08/2021, 2:11 PM [address],  [address] Office [phone] Pager [phone]

## 2021-05-08 NOTE — ED Notes (Signed)
Radiology tech came out of room and advised that the pt needed medication for claustrophobic for a ct scan. Provider sent a message

## 2021-05-08 NOTE — Hospital Course (Addendum)
Pafib, COPD Flipped into afib thurs, rate controlled SHOBE, fatigue, prod cough, frequent pneumonia Husband passed, moved here with sister from [place] 4 ablations, multiple anti-arrythmics on diltiazem, eliquis Cards will see today, multifocal pneumo on CT with antibiotics Keflex for cat scratch, rocephin for that  PMH: A.fib/flutter Eczema COPD/asthma HTN Hypothyroidism HLD Melanoma  PSH: Ablations Cataract surgery Ear surgery  Social History Recently moved from senior living facility at [employer]. Husband passed away 2023-03-21. Currently living with sister. Currently retired. 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 Drinks about 2-3 drinks ([place] + Ginger ale) daily. Used to smoke 1.5 pack daily for 15-20 years but quit 30 years prior. Denies any prior illicit substance use. Able to perform ADLs, IADLs at home.  On review of systems, she denies any fevers. Does endorse chills. Denies any nausea, vomiting, constipation. She denies any dysuria. Does endorse urinary frequency that has been ongoing for couple years. Mentions some chronic issue with 'balance.' Eyesight worse after cataract surgery.

## 2021-05-09 ENCOUNTER — Encounter (HOSPITAL_COMMUNITY): Payer: Self-pay | Admitting: Internal Medicine

## 2021-05-09 ENCOUNTER — Observation Stay (HOSPITAL_COMMUNITY): Payer: Medicare Other

## 2021-05-09 DIAGNOSIS — L309 Dermatitis, unspecified: Secondary | ICD-10-CM | POA: Diagnosis not present

## 2021-05-09 DIAGNOSIS — I4819 Other persistent atrial fibrillation: Secondary | ICD-10-CM | POA: Diagnosis not present

## 2021-05-09 DIAGNOSIS — Z20822 Contact with and (suspected) exposure to covid-19: Secondary | ICD-10-CM | POA: Diagnosis not present

## 2021-05-09 DIAGNOSIS — S90812A Abrasion, left foot, initial encounter: Secondary | ICD-10-CM | POA: Diagnosis present

## 2021-05-09 DIAGNOSIS — I340 Nonrheumatic mitral (valve) insufficiency: Secondary | ICD-10-CM | POA: Diagnosis not present

## 2021-05-09 DIAGNOSIS — J440 Chronic obstructive pulmonary disease with acute lower respiratory infection: Secondary | ICD-10-CM | POA: Diagnosis not present

## 2021-05-09 DIAGNOSIS — K5900 Constipation, unspecified: Secondary | ICD-10-CM | POA: Diagnosis present

## 2021-05-09 DIAGNOSIS — I4892 Unspecified atrial flutter: Secondary | ICD-10-CM | POA: Diagnosis not present

## 2021-05-09 DIAGNOSIS — Z880 Allergy status to penicillin: Secondary | ICD-10-CM | POA: Diagnosis not present

## 2021-05-09 DIAGNOSIS — J441 Chronic obstructive pulmonary disease with (acute) exacerbation: Secondary | ICD-10-CM | POA: Diagnosis not present

## 2021-05-09 DIAGNOSIS — Z7901 Long term (current) use of anticoagulants: Secondary | ICD-10-CM | POA: Diagnosis not present

## 2021-05-09 DIAGNOSIS — E785 Hyperlipidemia, unspecified: Secondary | ICD-10-CM | POA: Diagnosis not present

## 2021-05-09 DIAGNOSIS — Z79899 Other long term (current) drug therapy: Secondary | ICD-10-CM | POA: Diagnosis not present

## 2021-05-09 DIAGNOSIS — E7800 Pure hypercholesterolemia, unspecified: Secondary | ICD-10-CM | POA: Diagnosis present

## 2021-05-09 DIAGNOSIS — W5503XA Scratched by cat, initial encounter: Secondary | ICD-10-CM | POA: Diagnosis not present

## 2021-05-09 DIAGNOSIS — J189 Pneumonia, unspecified organism: Secondary | ICD-10-CM | POA: Diagnosis not present

## 2021-05-09 DIAGNOSIS — I4891 Unspecified atrial fibrillation: Secondary | ICD-10-CM

## 2021-05-09 DIAGNOSIS — Z7951 Long term (current) use of inhaled steroids: Secondary | ICD-10-CM | POA: Diagnosis not present

## 2021-05-09 DIAGNOSIS — I361 Nonrheumatic tricuspid (valve) insufficiency: Secondary | ICD-10-CM | POA: Diagnosis not present

## 2021-05-09 DIAGNOSIS — I352 Nonrheumatic aortic (valve) stenosis with insufficiency: Secondary | ICD-10-CM | POA: Diagnosis not present

## 2021-05-09 DIAGNOSIS — Z888 Allergy status to other drugs, medicaments and biological substances status: Secondary | ICD-10-CM | POA: Diagnosis not present

## 2021-05-09 DIAGNOSIS — Z87891 Personal history of nicotine dependence: Secondary | ICD-10-CM | POA: Diagnosis not present

## 2021-05-09 DIAGNOSIS — K219 Gastro-esophageal reflux disease without esophagitis: Secondary | ICD-10-CM | POA: Diagnosis present

## 2021-05-09 DIAGNOSIS — I351 Nonrheumatic aortic (valve) insufficiency: Secondary | ICD-10-CM | POA: Diagnosis not present

## 2021-05-09 DIAGNOSIS — A6000 Herpesviral infection of urogenital system, unspecified: Secondary | ICD-10-CM | POA: Diagnosis present

## 2021-05-09 DIAGNOSIS — E039 Hypothyroidism, unspecified: Secondary | ICD-10-CM | POA: Diagnosis not present

## 2021-05-09 DIAGNOSIS — I10 Essential (primary) hypertension: Secondary | ICD-10-CM | POA: Diagnosis not present

## 2021-05-09 DIAGNOSIS — Z79890 Hormone replacement therapy (postmenopausal): Secondary | ICD-10-CM | POA: Diagnosis not present

## 2021-05-09 DIAGNOSIS — E559 Vitamin D deficiency, unspecified: Secondary | ICD-10-CM | POA: Diagnosis not present

## 2021-05-09 LAB — BASIC METABOLIC PANEL
Anion gap: 8 (ref 5–15)
BUN: 13 mg/dL (ref 8–23)
CO2: 23 mmol/L (ref 22–32)
Calcium: 9.6 mg/dL (ref 8.9–10.3)
Chloride: 105 mmol/L (ref 98–111)
Creatinine, Ser: 0.69 mg/dL (ref 0.44–1.00)
GFR, Estimated: >60 mL/min (ref >60)
Glucose, Bld: 125 mg/dL — ABNORMAL HIGH (ref 70–99)
Potassium: 4.2 mmol/L (ref 3.5–5.1)
Sodium: 136 mmol/L (ref 135–145)

## 2021-05-09 LAB — CBC WITH DIFFERENTIAL/PLATELET
Abs Immature Granulocytes: 0.05 10*3/uL (ref 0.00–0.07)
Basophils Absolute: 0 10*3/uL (ref 0.0–0.1)
Basophils Relative: 0 %
Eosinophils Absolute: 0 10*3/uL (ref 0.0–0.5)
Eosinophils Relative: 0 %
HCT: 39.7 % (ref 36.0–46.0)
Hemoglobin: 12.7 g/dL (ref 12.0–15.0)
Immature Granulocytes: 0 %
Lymphocytes Relative: 19 %
Lymphs Abs: 2.2 10*3/uL (ref 0.7–4.0)
MCH: 28.9 pg (ref 26.0–34.0)
MCHC: 32 g/dL (ref 30.0–36.0)
MCV: 90.2 fL (ref 80.0–100.0)
Monocytes Absolute: 1 10*3/uL (ref 0.1–1.0)
Monocytes Relative: 8 %
Neutro Abs: 8.6 10*3/uL — ABNORMAL HIGH (ref 1.7–7.7)
Neutrophils Relative %: 73 %
Platelets: 297 10*3/uL (ref 150–400)
RBC: 4.4 MIL/uL (ref 3.87–5.11)
RDW: 13.9 % (ref 11.5–15.5)
WBC: 11.9 10*3/uL — ABNORMAL HIGH (ref 4.0–10.5)
nRBC: 0 % (ref 0.0–0.2)

## 2021-05-09 LAB — ECHOCARDIOGRAM COMPLETE
AR max vel: 2.04 cm2
AV Area VTI: 1.77 cm2
AV Area mean vel: 1.8 cm2
AV Mean grad: 9 mmHg
AV Peak grad: 17.5 mmHg
Ao pk vel: 2.09 m/s
Area-P 1/2: 4.49 cm2
Height: 69 in
S' Lateral: 3.5 cm
Weight: 2832 oz

## 2021-05-09 MED ORDER — PERFLUTREN LIPID MICROSPHERE
1.0000 mL | INTRAVENOUS | Status: AC | PRN
  Administered 2021-05-09: 5 mL via INTRAVENOUS
  Filled 2021-05-09: qty 10

## 2021-05-09 MED ORDER — LEVALBUTEROL HCL 0.63 MG/3ML IN NEBU
0.6300 mg | INHALATION_SOLUTION | Freq: Three times a day (TID) | RESPIRATORY_TRACT | Status: DC | PRN
Start: 2021-05-09 — End: 2021-05-10

## 2021-05-09 MED ORDER — SODIUM CHLORIDE 0.9 % IV SOLN
500.0000 mg | Freq: Once | INTRAVENOUS | Status: AC
  Administered 2021-05-09: 500 mg via INTRAVENOUS
  Filled 2021-05-09: qty 500

## 2021-05-09 MED ORDER — ACETAMINOPHEN 500 MG PO TABS
1000.0000 mg | ORAL_TABLET | Freq: Three times a day (TID) | ORAL | Status: DC | PRN
  Administered 2021-05-09: 1000 mg via ORAL
  Filled 2021-05-09: qty 2

## 2021-05-09 MED ORDER — SODIUM CHLORIDE 0.9 % IV SOLN
1.0000 g | Freq: Once | INTRAVENOUS | Status: AC
  Administered 2021-05-09: 1 g via INTRAVENOUS
  Filled 2021-05-09: qty 10

## 2021-05-09 NOTE — Progress Notes (Signed)
  Date: 05/09/2021  Patient name: Christine Moore  Medical record number: [id number]  Date of birth: [birth year]   I have seen and evaluated Christine Moore and discussed their care with the Residency Team. Briefly, [name] is a 75 year old woman with PMH of COPD, hypothyroidism, HLD, Afib on Eliquis who presented with fatigue, DOE, weakness for about 5 days.  She also noted increased cough and sputum production.  She felt she had converted into Afib and she can always feel it.  She was also started on therapy for a COPD exacerbation with possible super infection (pneumonia).  She notes feeling better this morning, but still feeling like she is in Afib.  Cardiology was consulted as well.    Vitals:   05/09/21 1111 05/09/21 1232  BP: 126/76   Pulse: 71 75  Resp: 20   Temp: 98.2 F (36.8 C)   SpO2: 93% 95%   Gen: Elderly woman, appears younger than stated age, sitting on side of bed Eyes: Anicteric sclerae HENT: Neck is supple, MMM CV: Irreg Irreg, no murmur, normal rate, no pedal edema Pulm: Breathing comfortably on room air, no wheezing, decreased breath sounds at bases MSK: Normal tone and bulk for age Skin: She has a scratch (cat) over the left medial ankle which is healing with some very mild erythema surrounding.   CT scan showed patchy airspace disease, possible multifocal pneumonia and a RUL nodule.    Assessment and Plan: I have seen and evaluated the patient as outlined above. I agree with the formulated Assessment and Plan as detailed in the residents' note, with the following changes:   1. Afib, rate controlled, persistent - Cardiology consulted, plan for DCCV with TEE tomorrow - Monitor rate on telemetry - Continue eliquis - Continue home diltiazem  2. COPD exacerbation, possible superimposed infection - CT scan with possible multifocal pneumonia.  Reviewed CT scan report from 05/08/20, 11/07/20 and 01/28/21.  She has scarring and nodules.  She also has been noted to have  the infiltrates in similar locations on last CT scan of the chest.  She is further noted to have bronchiectasis.  Curious if she could have a chronic atypical infection.  - Treatment for COPD exac with incruse ellipta, Breo ellipta and PRN xopenex - Rocephin and azithromycin X 5 days - Consider outpatient HRCT of the lung for further information - Continue home oxygen  Agree with PT/OT evaluation  Other issues per daily note by team.   3.   Christine Catalina, MD 6/9/20222:12 PM

## 2021-05-09 NOTE — Evaluation (Signed)
Occupational Therapy Evaluation Patient Details Name: Christine Christine Moore Christine Moore MRN: [id number] DOB: [birth year] Today's Date: 05/09/2021    History of Present Illness Pt is a 75 y/o female admitted secondary to SOB and afib. Chest imaging also revealing likely PNA. PMH includes COPD and a fib.   Clinical Impression   PTA Patient reports independent and driving. Admitted for above and presenting with problem list below, including impaired balance and decreased activity tolerance.  Patient on RA during session with VSS. Completing transfers with min guard, ADLs with up to min guard assist.  Based on performance today, patient will benefit from continued OT services acutely to optimize independence, safety and return to PLOF but anticipate no further needs after dc home.     Follow Up Recommendations  No OT follow up;Supervision - Intermittent    Equipment Recommendations  None recommended by OT    Recommendations for Other Services       Precautions / Restrictions Precautions Precautions: Fall Restrictions Weight Bearing Restrictions: No      Mobility Bed Mobility Overal bed mobility: Needs Assistance Bed Mobility: Supine to Sit;Sit to Supine     Supine to sit: Supervision Sit to supine: Supervision   General bed mobility comments: Supervision for safety and line management.    Transfers Overall transfer level: Needs assistance Equipment used: None Transfers: Sit to/from Stand Sit to Stand: Min guard         General transfer comment: min guard for safety and balance    Balance Overall balance assessment: Needs assistance Sitting-balance support: No upper extremity supported;Feet supported Sitting balance-Leahy Scale: Fair     Standing balance support: During functional activity;No upper extremity supported Standing balance-Leahy Scale: Fair Standing balance comment: min guard dynamically                           ADL either performed or assessed with  clinical judgement   ADL Overall ADL's : Needs assistance/impaired     Grooming: Min guard;Standing   Upper Body Bathing: Set up;Sitting   Lower Body Bathing: Min guard;Sit to/from stand   Upper Body Dressing : Set up;Sitting   Lower Body Dressing: Min guard;Sit to/from stand   Toilet Transfer: Min guard;Ambulation Toilet Transfer Details (indicate cue type and reason): simulated in room         Functional mobility during ADLs: Min guard       Vision         Perception     Praxis      Pertinent Vitals/Pain Pain Assessment: No/denies pain     Hand Dominance     Extremity/Trunk Assessment Upper Extremity Assessment Upper Extremity Assessment: Overall WFL for tasks assessed   Lower Extremity Assessment Lower Extremity Assessment: Defer to PT evaluation   Cervical / Trunk Assessment Cervical / Trunk Assessment: Normal   Communication Communication Communication: No difficulties   Cognition Arousal/Alertness: Awake/alert Behavior During Therapy: WFL for tasks assessed/performed Overall Cognitive Status: Within Functional Limits for tasks assessed                                     General Comments  VSS on RA    Exercises     Shoulder Instructions      Home Living Family/patient expects to be discharged to:: Private residence Living Arrangements: Alone Available Help at Discharge: Family;Available PRN/intermittently Type of Home: House Home Access: Stairs  to enter Entrance Stairs-Number of Steps: 1 threshold   Home Layout: One level     Bathroom Shower/Tub: Walk-in shower;Tub only;Tub/shower unit   Bathroom Toilet: Standard     Home Equipment: Environmental consultant - 2 wheels;Walker - 4 wheels;Wheelchair - power          Prior Functioning/Environment Level of Independence: Independent                 OT Problem List: Decreased strength;Impaired balance (sitting and/or standing);Decreased knowledge of use of DME or AE       OT Treatment/Interventions: Self-care/ADL training;DME and/or AE instruction;Therapeutic activities;Balance training;Patient/family education    OT Goals(Current goals can be found in the care plan section) Acute Rehab OT Goals Patient Stated Goal: home OT Goal Formulation: With patient Time For Goal Achievement: 05/23/21 Potential to Achieve Goals: Good  OT Frequency: Min 2X/week   Barriers to D/C:            Co-evaluation              AM-PAC OT "6 Clicks" Daily Activity     Outcome Measure Help from another person eating meals?: None Help from another person taking care of personal grooming?: A Little Help from another person toileting, which includes using toliet, bedpan, or urinal?: A Little Help from another person bathing (including washing, rinsing, drying)?: A Little Help from another person to put on and taking off regular upper body clothing?: A Little Help from another person to put on and taking off regular lower body clothing?: A Little 6 Click Score: 19   End of Session Equipment Utilized During Treatment: Gait belt Nurse Communication: Mobility status  Activity Tolerance: Patient tolerated treatment well Patient left: in bed;with call bell/phone within reach;with bed alarm set  OT Visit Diagnosis: Other abnormalities of gait and mobility (R26.89)                Time: 5631-4970 OT Time Calculation (min): 10 min Charges:  OT General Charges $OT Visit: 1 Visit OT Evaluation $OT Eval Low Complexity: 1 Low  Barry Brunner, OT Acute Rehabilitation Services Pager [phone] Office [phone]    Chancy Milroy 05/09/2021, 10:58 AM

## 2021-05-09 NOTE — H&P (View-Only) (Signed)
Subjective:  Patient is breathing better this morning. She states she does not get out of breath when going to the bathroom like she used to before coming to the ED. She does continue to have a cough with sputum production. She can still notice some palpitations. The scratch on her foot is not hurting her anymore.  Objective:  Vital signs in last 24 hours: Vitals:   05/09/21 0732 05/09/21 0900 05/09/21 1000 05/09/21 1111  BP: 122/74   126/76  Pulse:  72 83 71  Resp: 19   20  Temp: (!) 97.4 F (36.3 C)   98.2 F (36.8 C)  TempSrc: Oral   Oral  SpO2:  93% 93% 93%  Weight:      Height:       Weight change:  No intake or output data in the 24 hours ending 05/09/21 1229  PHYSICAL EXAM     GEN: Well developed, sitting on side of bed in NAD CVS: Irregularly irregular rhythm, normal S1/S2, no murmurs, rubs, gallops RESP: Breathing comfortably on RA, no retractions, wheezes, rhonchi, or crackles SKIN: Healing scratch on L medial calcaneous. EXT: Moves all extremities equally. No pitting edema.   Assessment/Plan:  Active Problems:   Atrial fibrillation [facility])  Patient Summary: [name] is a 75 yo female with a PMH of hypothyroidism, persistent A.fib/flutter, COPD/asthma, and eczema on hospital day 0 for evaluation of COPD exacerbation and persistent atrial fibrillation/flutter.  #COPD exacerbation Patient is breathing better today, although she still has the chronic cough with sputum production. She did not notice DOE to the bathroom this morning. She denies any chest pain or SOB at rest. She is speaking in full sentences on RA without difficulty. No wheezes or decreased breath sounds noted on exam today. On chart review, she did see a Pulmonologist in [place] who evaluated her for cough and referred to GI for thoughts the cough was 2/2 to GERD. She had seen GI specialist a month before this visit who suggested also the cough was 2/2 GERD and performed an EGD that showed antral  erythema and a small, 2 cm hiatal hernia. Biopsies at this time of the gastric antrum showed focal, active, chronic gastritis. GI had then referred her to ENT for further evaluation. In addition, a CT scan in January 2011 demonstrated bibasilar lung scarring. Multiple CT scans since this time have yielded enlarging areas of scarring/atelectasis, including CT performed here on admission. She has never had a high-resolution CT scan from my chart review which would be helpful in assessing the possibility of ILD as a cause of her recurrent dyspnea and pulmonary symptoms. Her last PFTs were completed in December 2019 which demonstrated moderate obstruction with mildly impaired DLCO proportional to alveolar ventilation. For this visit, antibiotics were resumed given 2/3 clinical criteria for COPD exacerbation: increasing dyspnea and sputum production. She was started back on Zithromax 500 mg IV and Rocephin 1 g IV. In addition, she states that she prefers levalbuterol over albuterol for her breathing due to subjective tachycardia with albuterol. -Start Xopenex nebulizer Q8H prn, discontinue albuterol inhaler -Continue Zithromax and Rocephin -Continue Breo Ellipta, Incruse Ellipta, and Singulair -Continue 2L O2 nasal cannula, incentive spirometry -Referral to Pulmonology for outpatient follow up at discharge; consider high-res CT for evaluation of ILD and new PFTs  #Persistent atrial fibrillation/flutter Patient continues to feel palpitations. She denies any dizziness or chest pain. Cardiology consulted and recommended continuing home dose of Cardizem and Eliquis. They will also proceed with TEE/cardioversion tomorrow  at 1 PM. -NPO at 5 AM for cardioversion -Continue Cardizem, Eliquis -Follow up rhythm improvement  #Cat scratch Patient denies any tenderness of the area. The scratch looks like it is healing nicely. Rocephin ordered for presumed COPD exacerbation will also provide extra coverage against  bacterial infection. -Monitor improvement  #Balance concerns Patient does not complain of balance difficulties today. Physical therapy recommends outpatient PT to help improve mobility. Occupational therapy recommends further acute follow up but no further OT after discharge. -PT, OT following   LOS: 0 days   Samantha Crimes, Medical Student 05/09/2021, 12:29 PM Pager: [phone] After 5pm on weekdays and 1pm on weekends: On Call pager [phone]

## 2021-05-09 NOTE — Progress Notes (Addendum)
Progress Note  Patient Name: Christine Moore Date of Encounter: 05/09/2021  [facility] Cardiologist: Darral Rishel / Lalla Brothers   Subjective   75 yo with extensive history of atrial fibrillation.  She has had 4 ablations in the past, multiple cardioversions, has failed multiple atrial fibrillation medications.  She presented to the ER yesterday with a week long history of recurrent atrial fibrillation.  She is also been found to have a multifocal pneumonia.  She will need to be established with one of our EP doctors.    Inpatient Medications    Scheduled Meds:  apixaban  5 mg Oral BID   atorvastatin  10 mg Oral Daily   diltiazem  240 mg Oral Daily   fluticasone furoate-vilanterol  1 puff Inhalation Daily   levothyroxine  50 mcg Oral Daily   losartan  50 mg Oral Daily   montelukast  10 mg Oral QHS   pantoprazole  40 mg Oral Daily   umeclidinium bromide  1 puff Inhalation Daily   Continuous Infusions:  azithromycin     cefTRIAXone (ROCEPHIN)  IV     PRN Meds: albuterol   Vital Signs    Vitals:   05/08/21 2353 05/09/21 0356 05/09/21 0721 05/09/21 0732  BP: 135/77 133/72  122/74  Pulse: 65 71    Resp: 18 17  19   Temp: 97.8 F (36.6 C) 98 F (36.7 C)  (!) 97.4 F (36.3 C)  TempSrc: Oral Oral  Oral  SpO2: 96% 95% 95%   Weight:      Height:       No intake or output data in the 24 hours ending 05/09/21 0818 Last 3 Weights 05/07/2021  Weight (lbs) 177 lb  Weight (kg) 80.287 kg      Telemetry    Atrial fibrillation with a well-controlled ventricular response- Personally Reviewed  ECG     - Personally Reviewed  Physical Exam   GEN: Elderly female, no acute distress Neck: No JVD Cardiac: Irregularly irregular.  Soft systolic murmur radiating to the left axillary line. Respiratory: Few rales and wheezes bilaterally. GI: Soft, nontender, non-distended  MS: No edema; No deformity. Neuro:  Nonfocal  Psych: Normal affect   Labs    High Sensitivity Troponin:    Recent Labs  Lab 05/07/21 1603 05/08/21 0104  TROPONINIHS 13 11      Chemistry Recent Labs  Lab 05/07/21 1603 05/08/21 1051 05/09/21 0107  NA 137 138 136  K 5.2* 4.1 4.2  CL 106 104 105  CO2 24 21* 23  GLUCOSE 90 174* 125*  BUN 20 11 13   CREATININE 0.74 0.59 0.69  CALCIUM 9.7 9.3 9.6  GFRNONAA >60 >60 >60  ANIONGAP 7 13 8      Hematology Recent Labs  Lab 05/07/21 1603 05/08/21 1051 05/09/21 0107  WBC 11.9* 9.4 11.9*  RBC 4.83 4.83 4.40  HGB 14.1 14.0 12.7  HCT 44.5 43.6 39.7  MCV 92.1 90.3 90.2  MCH 29.2 29.0 28.9  MCHC 31.7 32.1 32.0  RDW 14.1 14.0 13.9  PLT 315 284 297    BNP Recent Labs  Lab 05/08/21 0636  BNP 106.1*     DDimer No results for input(s): DDIMER in the last 168 hours.   Radiology    DG Chest 2 View  Result Date: 05/07/2021 CLINICAL DATA:  Shortness of breath. EXAM: CHEST - 2 VIEW COMPARISON:  None. FINDINGS: The heart size and mediastinal contours are within normal limits. No pneumothorax or pleural effusion is noted. Mild  bibasilar subsegmental atelectasis is noted. Rounded density is seen in right infrahilar region concerning for possible mass or neoplasm. The visualized skeletal structures are unremarkable. IMPRESSION: Mild bibasilar subsegmental atelectasis. Rounded density seen in right infrahilar region concerning for possible neoplasm or mass. CT scan of the chest with intravenous contrast is recommended. Electronically Signed   By: Lupita Raider M.D.   On: 05/07/2021 16:29   CT Angio Chest PE W and/or Wo Contrast  Result Date: 05/08/2021 CLINICAL DATA:  Chest pain and shortness of breath. EXAM: CT ANGIOGRAPHY CHEST WITH CONTRAST TECHNIQUE: Multidetector CT imaging of the chest was performed using the standard protocol during bolus administration of intravenous contrast. Multiplanar CT image reconstructions and MIPs were obtained to evaluate the vascular anatomy. CONTRAST:  16mL OMNIPAQUE IOHEXOL 350 MG/ML SOLN COMPARISON:  None.  FINDINGS: Cardiovascular: Satisfactory opacification of the pulmonary arteries to the segmental level. No evidence of pulmonary embolism. Enlarged heart size. No pericardial effusion. 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 Mediastinum/Nodes: No enlarged mediastinal, hilar, or axillary lymph nodes. Thyroid gland, trachea, and esophagus demonstrate no significant findings. Shotty mediastinal lymph, sub pathologic by CT criteria. Lungs/Pleura: No evidence of pleural effusion or pneumothorax. 4 mm right upper lobe perifissural pulmonary nodule, image 51/101, lung windows. Patchy bilateral lower lobe airspace consolidation, more confluent in the left lower lobe. Consolidation versus atelectasis in the medial segment of the right middle lobe. More subtle scattered ground-glass opacities present in the upper lobes. Upper Abdomen: No acute abnormality. Musculoskeletal: No chest wall abnormality. No acute or significant osseous findings. Review of the MIP images confirms the above findings. IMPRESSION: 1. No evidence of pulmonary embolus. 2. Patchy bilateral lower lobe airspace consolidation, more confluent in the left lower lobe. Consolidation versus atelectasis in the medial segment of the right middle lobe. More subtle scattered ground-glass opacities in the upper lobes. These findings may represent multifocal pneumonia. 3. 4 mm right upper lobe perifissural pulmonary nodule. No follow-up needed if patient is low-risk. Non-contrast chest CT can be considered in 12 months if patient is high-risk. This recommendation follows the consensus statement: Guidelines for Management of Incidental Pulmonary Nodules Detected on CT Images: From the Fleischner Society 2017; Radiology 2017; 284:228-243. 4. Enlarged heart size. 5. 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 Aortic Atherosclerosis (ICD10-I70.0). Electronically Signed   By: Ted Mcalpine M.D.   On: 05/08/2021  09:32    Cardiac Studies     Patient Profile     75 y.o. female  with complex hx of Atrial fib / ablations .   Assessment & Plan    1.  Recurrent atrial fibrillation: Her ventricular response is well controlled.  She has been back on her Eliquis now for several days after missing 2 doses while waiting in the emergency room waiting room.  We will schedule her for a TEE/cardioversion tomorrow.   We will also continue to treat her multifocal pneumonia with IV antibiotics. We have discussed the risk, benefits, options regarding TEE cardioversion. This is scheduled for 1 PM on Friday  Ultimately she will need to be established with 1 of our electrophysiology physicians.  2.  Multifocal pneumonia: Continue IV antibiotics.  3.  Hyperlipidemia:   cont atorvastatin        For questions or updates, please contact [facility] Please consult [email or web address] for contact info under        Signed, Kristeen Miss, MD  05/09/2021, 8:18 AM

## 2021-05-09 NOTE — Plan of Care (Signed)

## 2021-05-09 NOTE — Progress Notes (Signed)
 Subjective:  Patient is breathing better this morning. She states she does not get out of breath when going to the bathroom like she used to before coming to the ED. She does continue to have a cough with sputum production. She can still notice some palpitations. The scratch on her foot is not hurting her anymore.  Objective:  Vital signs in last 24 hours: Vitals:   05/09/21 0732 05/09/21 0900 05/09/21 1000 05/09/21 1111  BP: 122/74   126/76  Pulse:  72 83 71  Resp: 19   20  Temp: (!) 97.4 F (36.3 C)   98.2 F (36.8 C)  TempSrc: Oral   Oral  SpO2:  93% 93% 93%  Weight:      Height:       Weight change:  No intake or output data in the 24 hours ending 05/09/21 1229  PHYSICAL EXAM     GEN: Well developed, sitting on side of bed in NAD CVS: Irregularly irregular rhythm, normal S1/S2, no murmurs, rubs, gallops RESP: Breathing comfortably on RA, no retractions, wheezes, rhonchi, or crackles SKIN: Healing scratch on L medial calcaneous. EXT: Moves all extremities equally. No pitting edema.   Assessment/Plan:  Active Problems:   Atrial fibrillation (HCC)  Patient Summary: Christine Moore is a 75 yo female with a PMH of hypothyroidism, persistent A.fib/flutter, COPD/asthma, and eczema on hospital day 0 for evaluation of COPD exacerbation and persistent atrial fibrillation/flutter.  #COPD exacerbation Patient is breathing better today, although she still has the chronic cough with sputum production. She did not notice DOE to the bathroom this morning. She denies any chest pain or SOB at rest. She is speaking in full sentences on RA without difficulty. No wheezes or decreased breath sounds noted on exam today. On chart review, she did see a Pulmonologist in [place] who evaluated her for cough and referred to GI for thoughts the cough was 2/2 to GERD. She had seen GI specialist a month before this visit who suggested also the cough was 2/2 GERD and performed an EGD that showed antral  erythema and a small, 2 cm hiatal hernia. Biopsies at this time of the gastric antrum showed focal, active, chronic gastritis. GI had then referred her to ENT for further evaluation. In addition, a CT scan in January 2011 demonstrated bibasilar lung scarring. Multiple CT scans since this time have yielded enlarging areas of scarring/atelectasis, including CT performed here on admission. She has never had a high-resolution CT scan from my chart review which would be helpful in assessing the possibility of ILD as a cause of her recurrent dyspnea and pulmonary symptoms. Her last PFTs were completed in December 2019 which demonstrated moderate obstruction with mildly impaired DLCO proportional to alveolar ventilation. For this visit, antibiotics were resumed given 2/3 clinical criteria for COPD exacerbation: increasing dyspnea and sputum production. She was started back on Zithromax 500 mg IV and Rocephin 1 g IV. In addition, she states that she prefers levalbuterol over albuterol for her breathing due to subjective tachycardia with albuterol. -Start Xopenex nebulizer Q8H prn, discontinue albuterol inhaler -Continue Zithromax and Rocephin -Continue Breo Ellipta, Incruse Ellipta, and Singulair -Continue 2L O2 nasal cannula, incentive spirometry -Referral to Pulmonology for outpatient follow up at discharge; consider high-res CT for evaluation of ILD and new PFTs  #Persistent atrial fibrillation/flutter Patient continues to feel palpitations. She denies any dizziness or chest pain. Cardiology consulted and recommended continuing home dose of Cardizem and Eliquis. They will also proceed with TEE/cardioversion tomorrow   at 1 PM. -NPO at 5 AM for cardioversion -Continue Cardizem, Eliquis -Follow up rhythm improvement  #Cat scratch Patient denies any tenderness of the area. The scratch looks like it is healing nicely. Rocephin ordered for presumed COPD exacerbation will also provide extra coverage against  bacterial infection. -Monitor improvement  #Balance concerns Patient does not complain of balance difficulties today. Physical therapy recommends outpatient PT to help improve mobility. Occupational therapy recommends further acute follow up but no further OT after discharge. -PT, OT following   LOS: 0 days   Samantha Crimes, Medical Student 05/09/2021, 12:29 PM Pager: [phone] After 5pm on weekdays and 1pm on weekends: On Call pager [phone]

## 2021-05-09 NOTE — Progress Notes (Signed)
Physical Therapy Treatment Patient Details Name: Christine Moore MRN: [id number] DOB: December 23, 1945 Today's Date: 05/09/2021    History of Present Illness Pt is a 75 y/o female admitted secondary to SOB and afib. Chest imaging also revealing likely PNA. PMH includes COPD and a fib.    PT Comments    Pt pleasant and reports feeling much stronger and with increased endurance today. Pt with HR 75-90 and SPO2 95% on RA with pt able to walk 400' with cane without assist.  Pt encouraged to continue to walk acutely and obtain cane for safety.    Follow Up Recommendations  Outpatient PT (womens health/pelvic floor therapy)     Equipment Recommendations  Gilmer Mor (currently on backorder and pt reports she can purchase)    Recommendations for Other Services       Precautions / Restrictions Precautions Precautions: Fall Restrictions Weight Bearing Restrictions: No    Mobility  Bed Mobility Overal bed mobility: Modified Independent Bed Mobility: Supine to Sit;Sit to Supine     Supine to sit: Supervision Sit to supine: Supervision   General bed mobility comments: HOB 20 degrees able to exit bed to right    Transfers Overall transfer level: Modified independent Equipment used: None Transfers: Sit to/from Stand Sit to Stand: Min guard         General transfer comment: min guard for safety and balance  Ambulation/Gait Ambulation/Gait assistance: Modified independent (Device/Increase time) Gait Distance (Feet): 400 Feet Assistive device: Straight cane Gait Pattern/deviations: Step-through pattern;Decreased stride length   Gait velocity interpretation: >2.62 ft/sec, indicative of community ambulatory General Gait Details: pt with good use of cane with education for appropriate pattern without heavy reliance   Stairs Stairs: Yes Stairs assistance: Supervision Stair Management: Step to pattern;Forwards;With cane Number of Stairs: 1 General stair comments: pt automatically  reaching for support with stairs with education for cane use with step. Pt performed with cane with supervision and cues. Pt reports increased comfort and mobility with step with cane use   Wheelchair Mobility    Modified Rankin (Stroke Patients Only)       Balance Overall balance assessment: Needs assistance Sitting-balance support: No upper extremity supported;Feet supported Sitting balance-Leahy Scale: Good     Standing balance support: During functional activity;No upper extremity supported Standing balance-Leahy Scale: Good Standing balance comment: pt able to stand without assist, benefits from cane for gait                            Cognition Arousal/Alertness: Awake/alert Behavior During Therapy: WFL for tasks assessed/performed Overall Cognitive Status: Within Functional Limits for tasks assessed                                        Exercises      General Comments General comments (skin integrity, edema, etc.): VSS on RA      Pertinent Vitals/Pain Pain Assessment: No/denies pain    Home Living Family/patient expects to be discharged to:: Private residence Living Arrangements: Alone Available Help at Discharge: Family;Available PRN/intermittently Type of Home: House Home Access: Stairs to enter   Home Layout: One level Home Equipment: Environmental consultant - 2 wheels;Walker - 4 wheels;Wheelchair - power      Prior Function Level of Independence: Independent          PT Goals (current goals can now be found in the  care plan section) Acute Rehab PT Goals Patient Stated Goal: home Progress towards PT goals: Progressing toward goals    Frequency    Min 3X/week      PT Plan Current plan remains appropriate    Co-evaluation              AM-PAC PT "6 Clicks" Mobility   Outcome Measure  Help needed turning from your back to your side while in a flat bed without using bedrails?: None Help needed moving from lying on your  back to sitting on the side of a flat bed without using bedrails?: None Help needed moving to and from a bed to a chair (including a wheelchair)?: None Help needed standing up from a chair using your arms (e.g., wheelchair or bedside chair)?: None Help needed to walk in hospital room?: A Little Help needed climbing 3-5 steps with a railing? : None 6 Click Score: 23    End of Session   Activity Tolerance: Patient tolerated treatment well Patient left: in chair;with call bell/phone within reach;with family/visitor present Nurse Communication: Mobility status PT Visit Diagnosis: Other abnormalities of gait and mobility (R26.89)     Time: 2330-0762 PT Time Calculation (min) (ACUTE ONLY): 15 min  Charges:  $Gait Training: 8-22 mins                     Brach Birdsall P, PT Acute Rehabilitation Services Pager: [phone] Office: [phone]    Enedina Finner Muskaan Smet 05/09/2021, 12:37 PM

## 2021-05-10 ENCOUNTER — Encounter (HOSPITAL_COMMUNITY)
Admission: EM | Disposition: A | Discharge status: Home / Self Care | Payer: Self-pay | Source: Home / Self Care | Attending: Internal Medicine

## 2021-05-10 ENCOUNTER — Other Ambulatory Visit (HOSPITAL_COMMUNITY): Payer: Self-pay

## 2021-05-10 ENCOUNTER — Inpatient Hospital Stay (HOSPITAL_COMMUNITY): Payer: Medicare Other

## 2021-05-10 ENCOUNTER — Inpatient Hospital Stay (HOSPITAL_COMMUNITY): Payer: Medicare Other | Admitting: Certified Registered"

## 2021-05-10 ENCOUNTER — Encounter (HOSPITAL_COMMUNITY): Payer: Self-pay | Admitting: Internal Medicine

## 2021-05-10 DIAGNOSIS — I351 Nonrheumatic aortic (valve) insufficiency: Secondary | ICD-10-CM

## 2021-05-10 DIAGNOSIS — I340 Nonrheumatic mitral (valve) insufficiency: Secondary | ICD-10-CM

## 2021-05-10 DIAGNOSIS — I4891 Unspecified atrial fibrillation: Secondary | ICD-10-CM

## 2021-05-10 DIAGNOSIS — I361 Nonrheumatic tricuspid (valve) insufficiency: Secondary | ICD-10-CM

## 2021-05-10 HISTORY — PX: CARDIOVERSION: SHX1299

## 2021-05-10 HISTORY — PX: TEE WITHOUT CARDIOVERSION: SHX5443

## 2021-05-10 LAB — BASIC METABOLIC PANEL
Anion gap: 8 (ref 5–15)
BUN: 17 mg/dL (ref 8–23)
CO2: 23 mmol/L (ref 22–32)
Calcium: 9.5 mg/dL (ref 8.9–10.3)
Chloride: 107 mmol/L (ref 98–111)
Creatinine, Ser: 0.68 mg/dL (ref 0.44–1.00)
GFR, Estimated: >60 mL/min (ref >60)
Glucose, Bld: 94 mg/dL (ref 70–99)
Potassium: 4.4 mmol/L (ref 3.5–5.1)
Sodium: 138 mmol/L (ref 135–145)

## 2021-05-10 LAB — CBC
HCT: 40.3 % (ref 36.0–46.0)
Hemoglobin: 13.1 g/dL (ref 12.0–15.0)
MCH: 28.9 pg (ref 26.0–34.0)
MCHC: 32.5 g/dL (ref 30.0–36.0)
MCV: 89 fL (ref 80.0–100.0)
Platelets: 281 10*3/uL (ref 150–400)
RBC: 4.53 MIL/uL (ref 3.87–5.11)
RDW: 13.8 % (ref 11.5–15.5)
WBC: 9.9 10*3/uL (ref 4.0–10.5)
nRBC: 0 % (ref 0.0–0.2)

## 2021-05-10 SURGERY — ECHOCARDIOGRAM, TRANSESOPHAGEAL
Anesthesia: General

## 2021-05-10 MED ORDER — PROPOFOL 500 MG/50ML IV EMUL
INTRAVENOUS | Status: DC | PRN
  Administered 2021-05-10: 150 ug/kg/min via INTRAVENOUS

## 2021-05-10 MED ORDER — SODIUM CHLORIDE 0.9 % IV SOLN: INTRAVENOUS | Status: DC | PRN

## 2021-05-10 MED ORDER — GUAIFENESIN ER 600 MG PO TB12: 600.0000 mg | ORAL_TABLET | Freq: Two times a day (BID) | ORAL | Status: DC

## 2021-05-10 MED ORDER — SODIUM CHLORIDE 0.9 % IV SOLN
1.0000 g | Freq: Once | INTRAVENOUS | Status: AC
  Administered 2021-05-10: 1 g via INTRAVENOUS
  Filled 2021-05-10: qty 10

## 2021-05-10 MED ORDER — PROPOFOL 10 MG/ML IV BOLUS
INTRAVENOUS | Status: DC | PRN
  Administered 2021-05-10 (×2): 20 mg via INTRAVENOUS

## 2021-05-10 MED ORDER — CEFDINIR 300 MG PO CAPS
300.0000 mg | ORAL_CAPSULE | Freq: Two times a day (BID) | ORAL | 0 refills | Status: DC
  Filled 2021-05-10: qty 4, 2d supply, fill #0

## 2021-05-10 MED ORDER — SODIUM CHLORIDE 0.9 % IV SOLN
500.0000 mg | Freq: Once | INTRAVENOUS | Status: AC
  Administered 2021-05-10: 500 mg via INTRAVENOUS
  Filled 2021-05-10: qty 500

## 2021-05-10 MED ORDER — LEVALBUTEROL HCL 0.63 MG/3ML IN NEBU: 0.6300 mg | INHALATION_SOLUTION | Freq: Four times a day (QID) | RESPIRATORY_TRACT | Status: DC

## 2021-05-10 MED ORDER — LEVALBUTEROL HCL 0.63 MG/3ML IN NEBU: 0.6300 mg | INHALATION_SOLUTION | Freq: Three times a day (TID) | RESPIRATORY_TRACT | Status: DC

## 2021-05-10 NOTE — Anesthesia Procedure Notes (Signed)
Procedure Name: General with mask airway Date/Time: 05/10/2021 1:03 PM Performed by: Elliot Dally, CRNA Pre-anesthesia Checklist: Patient identified, Emergency Drugs available, Suction available, Patient being monitored and Timeout performed Patient Re-evaluated:Patient Re-evaluated prior to induction Oxygen Delivery Method: Nasal cannula

## 2021-05-10 NOTE — Anesthesia Preprocedure Evaluation (Addendum)
Anesthesia Evaluation  Patient identified by MRN, date of birth, ID band Patient awake    Reviewed: Allergy & Precautions, H&P , NPO status , Patient's Chart, lab work & pertinent test results  Airway Mallampati: II  TM Distance: >3 FB Neck ROM: Full    Dental no notable dental hx. (+) Teeth Intact, Dental Advisory Given   Pulmonary COPD,  COPD inhaler, former smoker,    Pulmonary exam normal breath sounds clear to auscultation       Cardiovascular hypertension, Pt. on medications + dysrhythmias Atrial Fibrillation  Rhythm:Regular Rate:Normal     Neuro/Psych negative neurological ROS  negative psych ROS   GI/Hepatic negative GI ROS, Neg liver ROS,   Endo/Other  negative endocrine ROS  Renal/GU negative Renal ROS  negative genitourinary   Musculoskeletal   Abdominal   Peds  Hematology negative hematology ROS (+)   Anesthesia Other Findings   Reproductive/Obstetrics negative OB ROS                            Anesthesia Physical Anesthesia Plan  ASA: 3  Anesthesia Plan: General   Post-op Pain Management:    Induction: Intravenous  PONV Risk Score and Plan: 3 and Propofol infusion and Treatment may vary due to age or medical condition  Airway Management Planned:   Additional Equipment:   Intra-op Plan:   Post-operative Plan:   Informed Consent: I have reviewed the patients History and Physical, chart, labs and discussed the procedure including the risks, benefits and alternatives for the proposed anesthesia with the patient or authorized representative who has indicated his/her understanding and acceptance.     Dental advisory given  Plan Discussed with: CRNA  Anesthesia Plan Comments:         Anesthesia Quick Evaluation

## 2021-05-10 NOTE — Transfer of Care (Signed)
Immediate Anesthesia Transfer of Care Note  Patient: Christine Moore  Procedure(s) Performed: TRANSESOPHAGEAL ECHOCARDIOGRAM (TEE) CARDIOVERSION  Patient Location: PACU and Endoscopy Unit  Anesthesia Type:MAC  Level of Consciousness: drowsy and patient cooperative  Airway & Oxygen Therapy: Patient Spontanous Breathing and Patient connected to nasal cannula oxygen  Post-op Assessment: Report given to RN and Post -op Vital signs reviewed and stable  Post vital signs: Reviewed and stable  Last Vitals:  Vitals Value Taken Time  BP 92/67 05/10/21 1335  Temp    Pulse 66 05/10/21 1337  Resp 12 05/10/21 1337  SpO2 98 % 05/10/21 1337  Vitals shown include unvalidated device data.  Last Pain:  Vitals:   05/10/21 1333  TempSrc:   PainSc: Asleep      Patients Stated Pain Goal: 0 (10/01/27 1188)  Complications: No notable events documented.

## 2021-05-10 NOTE — Progress Notes (Signed)
Progress Note  Patient Name: Christine Moore Date of Encounter: 05/10/2021  [facility] Cardiologist: Lyanne Kates / Lalla BrothersLambert   Subjective   75 yo with extensive history of atrial fibrillation.  She has had 4 ablations in the past, multiple cardioversions, has failed multiple atrial fibrillation medications.  She presented to the ER yesterday with a week long history of recurrent atrial fibrillation.  She is also been found to have a multifocal pneumonia.  She will need to be established with one of our EP doctors.    Inpatient Medications    Scheduled Meds:  apixaban  5 mg Oral BID   atorvastatin  10 mg Oral Daily   diltiazem  240 mg Oral Daily   fluticasone furoate-vilanterol  1 puff Inhalation Daily   levothyroxine  50 mcg Oral Daily   losartan  50 mg Oral Daily   montelukast  10 mg Oral QHS   pantoprazole  40 mg Oral Daily   umeclidinium bromide  1 puff Inhalation Daily   Continuous Infusions:   PRN Meds: acetaminophen, levalbuterol   Vital Signs    Vitals:   05/09/21 1915 05/09/21 2316 05/10/21 0346 05/10/21 0739  BP: (!) 129/92 136/85 (!) 145/85 139/74  Pulse: 70 64 72 63  Resp: 19 16 20 20   Temp: 98.1 F (36.7 C) 98 F (36.7 C) 97.6 F (36.4 C) 98.1 F (36.7 C)  TempSrc: Oral Oral Oral Oral  SpO2: 96% 95% 94% 94%  Weight:      Height:        Intake/Output Summary (Last 24 hours) at 05/10/2021 0851 Last data filed at 05/10/2021 0700 Gross per 24 hour  Intake 490 ml  Output --  Net 490 ml   Last 3 Weights 05/07/2021  Weight (lbs) 177 lb  Weight (kg) 80.287 kg      Telemetry    Atrial fibrillation with a well-controlled ventricular response-   personally Reviewed  ECG     - Personally Reviewed  Physical Exam  Physical Exam: Blood pressure 139/74, pulse 63, temperature 98.1 F (36.7 C), temperature source Oral, resp. rate 20, height 5\' 9"  (1.753 m), weight 80.3 kg, SpO2 94 %.  GEN:  Well nourished, well developed in no acute distress HEENT:  Normal NECK: No JVD; No carotid bruits LYMPHATICS: No lymphadenopathy CARDIAC:   irreg. Irreg.  RESPIRATORY:  Clear to auscultation without rales, wheezing or rhonchi  ABDOMEN: Soft, non-tender, non-distended MUSCULOSKELETAL:  No edema; No deformity  SKIN: Warm and dry NEUROLOGIC:  Alert and oriented x 3   Labs    High Sensitivity Troponin:   Recent Labs  Lab 05/07/21 1603 05/08/21 0104  TROPONINIHS 13 11       Chemistry Recent Labs  Lab 05/08/21 1051 05/09/21 0107 05/10/21 0125  NA 138 136 138  K 4.1 4.2 4.4  CL 104 105 107  CO2 21* 23 23  GLUCOSE 174* 125* 94  BUN 11 13 17   CREATININE 0.59 0.69 0.68  CALCIUM 9.3 9.6 9.5  GFRNONAA >60 >60 >60  ANIONGAP 13 8 8       Hematology Recent Labs  Lab 05/08/21 1051 05/09/21 0107 05/10/21 0125  WBC 9.4 11.9* 9.9  RBC 4.83 4.40 4.53  HGB 14.0 12.7 13.1  HCT 43.6 39.7 40.3  MCV 90.3 90.2 89.0  MCH 29.0 28.9 28.9  MCHC 32.1 32.0 32.5  RDW 14.0 13.9 13.8  PLT 284 297 281     BNP Recent Labs  Lab 05/08/21 0636  BNP 106.1*  DDimer No results for input(s): DDIMER in the last 168 hours.   Radiology    ECHOCARDIOGRAM COMPLETE  Result Date: 05/09/2021    ECHOCARDIOGRAM REPORT   Patient Name:   Christine Moore Date of Exam: 05/09/2021 Medical Rec #:  [id number]        Height:       69.0 in Accession #:    [id number]       Weight:       177.0 lb Date of Birth:  1946-02-05        BSA:          1.961 m Patient Age:    75 years         BP:           122/74 mmHg Patient Gender: F                HR:           71 bpm. Exam Location:  Inpatient Procedure: 2D Echo, Cardiac Doppler and Color Doppler Indications:    Atrial flutter  History:        Patient has no prior history of Echocardiogram examinations.                 COPD, Arrythmias:Atrial Fibrillation and Atrial Flutter; Risk                 Factors:Hypertension, Dyslipidemia and Former Smoker.  Sonographer:    Shirlean Kelly Referring Phys: [phone] EMILY B MULLEN   Sonographer Comments: Image acquisition challenging due to COPD and Image acquisition challenging due to respiratory motion. IMPRESSIONS  1. Poor acoustic windows even with use of Definity Pt in atrial fibrillation with PVCs during study .  2. Right ventricular systolic function is low normal. The right ventricular size is normal.  3. Left atrial size was severely dilated.  4. Right atrial size was moderately dilated.  5. Trivial mitral valve regurgitation. Moderate mitral annular calcification.  6. AV is thickened, calcified with minimally restricted motion. Peak and mean gradients through the valve are 18 and 9 mm Hg respectively Diminsionless index is 0.56 consistent with very mild aortic stenosis. . The aortic valve is tricuspid. Aortic valve regurgitation is moderate.  7. The inferior vena cava is normal in size with greater than 50% respiratory variability, suggesting right atrial pressure of 3 mmHg. FINDINGS  Left Ventricle: Poor acoustic windows even with use of Definity Pt in atrial fibrillation with PVCs during study. Left ventricular ejection fraction, by estimation, is 55 to 60%. The left ventricle has normal function. The left ventricular internal cavity size was normal in size. There is mild left ventricular hypertrophy. Left ventricular diastolic parameters are indeterminate. Right Ventricle: The right ventricular size is normal. Right vetricular wall thickness was not assessed. Right ventricular systolic function is low normal. Left Atrium: Left atrial size was severely dilated. Right Atrium: Right atrial size was moderately dilated. Pericardium: There is no evidence of pericardial effusion. Mitral Valve: There is mild thickening of the mitral valve leaflet(s). Moderate mitral annular calcification. Trivial mitral valve regurgitation. Tricuspid Valve: The tricuspid valve is normal in structure. Tricuspid valve regurgitation is mild. Aortic Valve: AV is thickened, calcified with minimally restricted  motion. Peak and mean gradients through the valve are 18 and 9 mm Hg respectively Diminsionless index is 0.56 consistent with very mild aortic stenosis. The aortic valve is tricuspid. Aortic valve regurgitation is moderate. Aortic valve mean gradient measures 9.0 mmHg. Aortic valve peak gradient measures  17.5 mmHg. Aortic valve area, by VTI measures 1.77 cm. Pulmonic Valve: The pulmonic valve was not well visualized. Pulmonic valve regurgitation is not visualized. No evidence of pulmonic stenosis. Aorta: The aortic root and ascending aorta are structurally normal, with no evidence of dilitation. Venous: The inferior vena cava is normal in size with greater than 50% respiratory variability, suggesting right atrial pressure of 3 mmHg. IAS/Shunts: No atrial level shunt detected by color flow Doppler.  LEFT VENTRICLE PLAX 2D LVIDd:         4.55 cm  Diastology LVIDs:         3.50 cm  LV e' medial:   6.20 cm/s LV PW:         1.05 cm  LV E/e' medial: 11.6 LV IVS:        1.30 cm LVOT diam:     2.00 cm LV SV:         67 LV SV Index:   34 LVOT Area:     3.14 cm  RIGHT VENTRICLE            IVC RV S prime:     9.57 cm/s  IVC diam: 1.60 cm LEFT ATRIUM              Index       RIGHT ATRIUM           Index LA diam:        3.80 cm  1.94 cm/m  RA Area:     21.85 cm LA Vol (A2C):   105.0 ml 53.53 ml/m RA Volume:   62.45 ml  31.84 ml/m LA Vol (A4C):   107.0 ml 54.55 ml/m LA Biplane Vol: 114.0 ml 58.12 ml/m  AORTIC VALVE AV Area (Vmax):    2.04 cm AV Area (Vmean):   1.80 cm AV Area (VTI):     1.77 cm AV Vmax:           209.00 cm/s AV Vmean:          139.000 cm/s AV VTI:            0.380 m AV Peak Grad:      17.5 mmHg AV Mean Grad:      9.0 mmHg LVOT Vmax:         136.00 cm/s LVOT Vmean:        79.600 cm/s LVOT VTI:          0.214 m LVOT/AV VTI ratio: 0.56  AORTA Ao Root diam: 3.00 cm Ao Asc diam:  3.80 cm MITRAL VALVE MV Area (PHT): 4.49 cm    SHUNTS MV Decel Time: 169 msec    Systemic VTI:  0.21 m MV E velocity: 72.00 cm/s   Systemic Diam: 2.00 cm Dietrich Pates MD Electronically signed by Dietrich Pates MD Signature Date/Time: 05/09/2021/2:10:22 PM    Final     Cardiac Studies     Patient Profile     75 y.o. female  with complex hx of Atrial fib / ablations .   Assessment & Plan    1.  Recurrent atrial fibrillation: Her ventricular response is well controlled.  She has been back on her Eliquis now for several days after missing 2 doses while waiting in the emergency room waiting room.   She is scheduled for TEE cardioversion today at 1.  We discussed the risk, benefits, options of TEE/cardioversion.  She understands and agrees to proceed.  2.  Multifocal pneumonia: Continue IV antibiotics.  White blood  cell count is 9.9.  3.  Hyperlipidemia:   cont atorvastatin        For questions or updates, please contact [facility] Please consult [email or web address] for contact info under        Signed, Kristeen Miss, MD  05/10/2021, 8:51 AM

## 2021-05-10 NOTE — Plan of Care (Signed)

## 2021-05-10 NOTE — Discharge Summary (Addendum)
Name: Christine Moore MRN: [id number] DOB: 08/02/1946 75 y.o. PCP: Pcp, No  Date of Admission: 05/07/2021  3:51 PM Date of Discharge: 05/10/2021 Attending Physician: [name], MD  Discharge Diagnosis: 1. COPD exacerbation 2. Persistent atrial fibrillation  Discharge Medications: Allergies as of 05/10/2021       Reactions   Penicillins Swelling   Unable to recall  Swelling not throat/face   Quinapril Swelling   Swelling of face Swelling of face   Latex Itching   Formaldehyde Itching   Other Rash        Medication List     TAKE these medications    acyclovir 400 MG tablet Commonly known as: ZOVIRAX Take 400 mg by mouth 3 (three) times daily as needed.   ALAWAY OP Place 1 drop into both eyes daily as needed (allergies).   atorvastatin 10 MG tablet Commonly known as: LIPITOR Take 10 mg by mouth at bedtime.   cefdinir 300 MG capsule Commonly known as: OMNICEF Take 1 capsule (300 mg total) by mouth 2 (two) times daily.   chlorpheniramine 4 MG tablet Commonly known as: CHLOR-TRIMETON Take 4 mg by mouth 2 (two) times daily as needed for allergies.   Cholecalciferol 10 MCG (400 UNIT) Caps Take 800 Units by mouth in the morning and at bedtime.   clobetasol ointment 0.05 % Commonly known as: TEMOVATE Apply 1 application topically daily as needed.   diltiazem 240 MG 24 hr capsule Commonly known as: TIAZAC Take 240 mg by mouth daily.   Eliquis 5 MG Tabs tablet Generic drug: apixaban Take 5 mg by mouth 2 (two) times daily.   FIBER-CAPS PO Take 1 capsule by mouth daily.   levalbuterol 45 MCG/ACT inhaler Commonly known as: XOPENEX HFA Inhale 1-2 puffs into the lungs every 4 (four) hours as needed for shortness of breath or wheezing.   levothyroxine 50 MCG tablet Commonly known as: SYNTHROID Take 50 mcg by mouth daily.   losartan 50 MG tablet Commonly known as: COZAAR Take 50 mg by mouth daily.   Lutein-Zeaxanthin 25-5 MG Caps Take 1 capsule  by mouth at bedtime.   montelukast 10 MG tablet Commonly known as: SINGULAIR Take 10 mg by mouth at bedtime.   naproxen sodium 220 MG tablet Commonly known as: ALEVE Take 220 mg by mouth daily as needed (mild pain).   pantoprazole 40 MG tablet Commonly known as: PROTONIX Take 40 mg by mouth daily.   Trelegy Ellipta 200-62.5-25 MCG/INH Aepb Generic drug: Fluticasone-Umeclidin-Vilant Inhale 1 puff into the lungs daily.       Disposition and follow-up:   Christine Moore was discharged from [facility] in Good condition.  At the hospital follow up visit please address:  1.  COPD exacerbation: patient was treated with 3 days of IV Zithromax and Rocephin and discharged on 2 days of oral Cefdinir to finish antibiotic course. Assess respiratory status, DOE, worsening chronic cough. Will need referral to pulmonology for further follow up. She may need high resolution CT to better evaluate chronic cough and chronic scarring/atelectasis noted on CT imaging.   2. Persistent atrial fibrillation: patient is rate-controlled at home on Cardizem and cardiology performed cardioversion and TEE. Assess improvement of a.fib and associated symptoms. Cardiology will follow up outpatient.  3. Balance concerns: patient has been experiencing balance issues at home where she has to hug the wall at times. PT recommended outpatient therapy. Assess improvement of symptoms.  4.  Labs / imaging needed at time  of follow-up: EKG  5.  Pending labs/ test needing follow-up: None  Follow-up Appointments:  Follow-up Information     Lanier Prude, MD Follow up.   Specialties: Cardiology, Radiology Why: 06/19/21 @ 11:00AM, electrophysiologist consult Contact information: [address] [place] [address] [phone]         Darrow Bussing, MD Follow up on 06/14/2021.   Specialty: Family Medicine Why: 2:15 for follow up apt, Contact information: [address] [place] [address] [phone]         [facility] Follow up.   Specialty: Rehabilitation Why: they will contact you for your apt times, if you do not hear from them in 3 days please give them a call. Contact information: [address] [id number] mc [place] [address] [phone]               Hospital Course by problem list: 1. COPD exacerbation Patient experienced progressive DOE, cough with sputum production, fatigue, and weakness on presentation. She was afebrile, RR 17, and SpO2 95% on RA. CXR and CT showed chronic atelectasis/scarring. Given symptoms of progressive dyspnea and increased sputum production, treated as COPD exacerbation with 3 days of IV Zithromax, Rocephin and symptomatic treatment. Her symptoms improved markedly. She did have recurrent wheezing, rales, and cough on morning of discharge, but she had not been taking her Xopenex the day before and suspect 2/2 to not using the medication. She was discharged on 2 days of oral Zithromax, cefdinir to finish out 5 day course of antibiotics with follow up with PCP.  2. Persistent atrial fibrillation/flutter Patient felt her heart beating out of rhythm at home. She mentioned multiple episodes of symptomatic a.fib over the last decade. Cardiology consulted and kept her on home dose of Cardizem and performed TEE cardioversion. She converted to NSR with cardioversion.   3. Cat scratch Patient has a cat scratch on left medial calcaneous. She went to urgent care where she was given keflex. Keflex was stopped due to Rocephin being started for COPD exacerbation. The scratch was healing nicely at discharge.  4. Balance concerns Patient reported balance issues at home where she had to hug the wall. PT/OT was consulted. PT recommended outpatient therapy. She was discharged with orders for outpatient PT.  5. No PCP, specialist care Patient just moved  here from [facility] to be closer to her sister after husband's passing. She does not have a PCP, cardiologist, or pulmonologist in [place]. TOC helped to establish PCP who can refer to pulmonology for recommended high-res CT and new PFTs given chronic opacities on imaging. Cardiology set up follow up appointments after cardioversion.  Discharge progress note: Patient is doing okay this morning because she did not sleep well last night, as she was worried about the cardioversion today. She also notes a new cough this morning, but she still endorses improved DOE. She denies chest pain and dizziness. She is ready to go home today after cardioversion.  Discharge Exam:   BP (!) 152/65 (BP Location: Right Arm)   Pulse 73   Temp 97.9 F (36.6 C) (Oral)   Resp 20   Ht 5\' 9"  (1.753 m)   Wt 80.3 kg   SpO2 93%   BMI 26.14 kg/m   GEN: Well developed, laying in bed in NAD CVS: Irregular rhythm, normal S1/S2, no murmurs, rubs, gallops RESP: Breathing comfortably on RA, inspiratory wheezes and expiratory rales heard in lung fields diffusely EXT: Moves all  extremities equally   PSY: Mood and affect normal. Thought content and behavior normal.  Pertinent Labs, Studies, and Procedures:  CBC Latest Ref Rng & Units 05/10/2021 05/09/2021 05/08/2021  WBC 4.0 - 10.5 K/uL 9.9 11.9(H) 9.4  Hemoglobin 12.0 - 15.0 g/dL 19.113.1 47.812.7 29.514.0  Hematocrit 36.0 - 46.0 % 40.3 39.7 43.6  Platelets 150 - 400 K/uL 281 297 284    CMP Latest Ref Rng & Units 05/10/2021 05/09/2021 05/08/2021  Glucose 70 - 99 mg/dL 94 621(H125(H) 086(V174(H)  BUN 8 - 23 mg/dL 17 13 11   Creatinine 0.44 - 1.00 mg/dL 7.840.68 6.960.69 2.950.59  Sodium 135 - 145 mmol/L 138 136 138  Potassium 3.5 - 5.1 mmol/L 4.4 4.2 4.1  Chloride 98 - 111 mmol/L 107 105 104  CO2 22 - 32 mmol/L 23 23 21(L)  Calcium 8.9 - 10.3 mg/dL 9.5 9.6 9.3    Resp panel negative Procalcitonin <0.10 BNP 106.1 Troponins 13>11 TSH 1.366  EKG Interpretation Date/Time:  Tuesday May 07 2021 15:53:04  EDT Ventricular Rate:  75 PR Interval:    QRS Duration: 88 QT Interval:  396 QTC Calculation: 442 R Axis:   86 Text Interpretation: Atrial fibrillation Abnormal QRS-T angle, consider primary T wave abnormality Abnormal ECG No old tracing to compare Confirmed by Drema Pryardama, Pedro [phone]([id number]) on 05/08/2021 2:04:02 AM Also confirmed by Drema Pryardama, Pedro [phone]([id number]), editor Elita QuickWatlington, Beverly ([id number])  on 05/08/2021 1:32:18 PM   CXR Impression:  Mild bibasilar subsegmental atelectasis. Rounded density seen in right infrahilar region concerning for possible neoplasm or mass. CT scan of the chest with intravenous contrast is recommended.   CTA Chest Impression:  1. No evidence of pulmonary embolus. 2. Patchy bilateral lower lobe airspace consolidation, more confluent in the left lower lobe. Consolidation versus atelectasis in the medial segment of the right middle lobe. More subtle scattered ground-glass opacities in the upper lobes. These findings may represent multifocal pneumonia. 3. 4 mm right upper lobe perifissural pulmonary nodule. No follow-up needed if patient is low-risk. Non-contrast chest CT can be considered in 12 months if patient is high-risk. This recommendation follows the consensus statement: Guidelines for Management of Incidental Pulmonary Nodules Detected on CT Images: From the Fleischner Society 2017; Radiology 2017; 284:228-243. 4. Enlarged heart size. 5. 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  Discharge Instructions: Discharge Instructions     Call MD for:  difficulty breathing, headache or visual disturbances   Complete by: As directed    Call MD for:  extreme fatigue   Complete by: As directed    Call MD for:  severe uncontrolled pain   Complete by: As directed    Call MD for:  temperature >100.4   Complete by: As directed    Diet - low sodium heart healthy   Complete by: As directed    Increase activity slowly   Complete by: As directed       Christine Moore, it was a pleasure getting to know you and take care of you. You were admitted with a COPD exacerbation and atrial fibrillation. You received antibiotics for your COPD and a cardioversion for your atrial fibrillation. Here are your discharge instructions: Take your Zithromax and cefpodoxime antibiotic tablets one time per day for two more days to finish out your antibiotic course. Continue taking your all of your other medications as prescribed. The Transitions of Care team will follow up with you about your appointment with your new primary care doctor in [place]. Your primary doctor can help  you get an appointment with a pulmonologist. Follow up with cardiology to ensure your atrial fibrillation/flutter is controlled and the physical therapist to help you improve balance and mobility. If you have any worsening shortness of breath, chest pain, or fever, be sure to contact your primary doctor or go to the emergency department to be evaluated.  Signed: Albertha Ghee, MD 05/10/2021, 3:12 PM   Pager: [phone] After 5pm on weekdays and 1pm on weekends: On Call pager [phone]

## 2021-05-10 NOTE — Anesthesia Postprocedure Evaluation (Signed)
Anesthesia Post Note  Patient: Christine Moore  Procedure(s) Performed: TRANSESOPHAGEAL ECHOCARDIOGRAM (TEE) CARDIOVERSION     Patient location during evaluation: Endoscopy Anesthesia Type: General Level of consciousness: awake and alert Pain management: pain level controlled Vital Signs Assessment: post-procedure vital signs reviewed and stable Respiratory status: spontaneous breathing, nonlabored ventilation and respiratory function stable Cardiovascular status: blood pressure returned to baseline and stable Postop Assessment: no apparent nausea or vomiting Anesthetic complications: no   No notable events documented.  Last Vitals:  Vitals:   05/10/21 1405 05/10/21 1421  BP: (!) 144/58 (!) 149/79  Pulse: 64 66  Resp: (!) 22 20  Temp:  (!) 36.3 C  SpO2: 98% 98%    Last Pain:  Vitals:   05/10/21 1421  TempSrc: Oral  PainSc: 0-No pain                 Marianita Botkin,W. EDMOND

## 2021-05-10 NOTE — Interval H&P Note (Signed)
History and Physical Interval Note:  05/10/2021 12:40 PM  Christine Moore  has presented today for surgery, with the diagnosis of AFIB.  The various methods of treatment have been discussed with the patient and family. After consideration of risks, benefits and other options for treatment, the patient has consented to  Procedure(s): TRANSESOPHAGEAL ECHOCARDIOGRAM (TEE) (N/A) CARDIOVERSION (N/A) as a surgical intervention.  The patient's history has been reviewed, patient examined, no change in status, stable for surgery.  I have reviewed the patient's chart and labs.  Questions were answered to the patient's satisfaction.     [employer]

## 2021-05-10 NOTE — Discharge Instructions (Addendum)
Christine Moore, it was a pleasure getting to know you and take care of you. You were admitted with a COPD exacerbation and atrial fibrillation. You received antibiotics for your COPD and a cardioversion for your atrial fibrillation. Here are your discharge instructions: Take Cefdinir to finish out your antibiotic course. Continue taking your all of your other medications as prescribed. The Transitions of Care team will follow up with you about your appointment with your new primary care doctor in [place]. Your primary doctor can help you get an appointment with a pulmonologist. Follow up with cardiology to ensure your atrial fibrillation/flutter is controlled and the physical therapist to help you improve balance and mobility. If you have any worsening shortness of breath, chest pain, or fever, be sure to contact your primary doctor or go to the emergency department to be evaluated.

## 2021-05-10 NOTE — CV Procedure (Signed)
   Transesophageal Echocardiogram  Indications: Atrial flutter  Time out performed Propofol utilized by anesthesia supervision  Findings:  Left Ventricle: Ejection fraction 50%  Mitral Valve: Mild mitral regurgitation  Aortic Valve: Moderate aortic valve sclerosis, moderate aortic regurgitation  Tricuspid Valve: Mild TR  Left Atrium: Normal, no left atrial appendage thrombus   Donato Schultz, MD      Electrical Cardioversion Procedure Note Christine Moore [id number] 1946-10-27  Procedure: Electrical Cardioversion Indications:  Atrial Fibrillation  Time Out: Verified patient identification, verified procedure,medications/allergies/relevent history reviewed, required imaging and test results available.  Performed  Procedure Details  The patient was NPO after midnight. Anesthesia was administered at the beside  by Dr. Sampson Goon with propofol.  Cardioversion was performed with synchronized biphasic defibrillation via AP pads with 200 joules.  2 attempt(s) were performed.  The patient converted to normal sinus rhythm.  Shortly after the first shock however she reverted back to atrial fibrillation.  A second shock at 200 J was then performed and there was a pause with reinitiation of sinus rhythm.  The patient tolerated the procedure well   IMPRESSION:  Successful cardioversion of atrial fibrillation.    Donato Schultz 05/10/2021, 1:38 PM

## 2021-06-18 NOTE — Progress Notes (Signed)
Electrophysiology Office Note:    Date:  06/19/2021   ID:  Christine Moore, DOB May 30, 1946, MRN [id number]  PCP:  Pcp, No  [facility] Cardiologist:  None  [facility] Electrophysiologist:  Lanier Prude, MD   Referring MD: No ref. provider found   Chief Complaint: Atrial fibrillation  History of Present Illness:    Christine Moore is a 75 y.o. female who presents for an evaluation of atrial fibrillation at the request of Dr. Elease Hashimoto. Their medical history includes COPD.  The patient was seen in consultation by Dr. Elease Hashimoto on May 08, 2021.  The patient has had 3 prior ablations for her atrial arrhythmias including a PVI in 2006 at [facility], redo ablation in June 2015 at [facility] and a surgical ablation in April 2019. She has previously tried flecainide and propafenone.  She was previously considered for pacemaker plus AV junction ablation but subsequently wore a heart monitor which showed normal sinus rhythm.   ATRIAL ABLATION SURGERY 2006  s/p pulmonary vein isolation (perfomed at [facility])   ATRIAL ABLATION SURGERY 05/30/2014  Pulmonary vein cryoablation 05/2014   ATRIAL ABLATION SURGERY 03/09/2018  Epicardial left atrial posterior wall ablation (Dr Dawna Part)   During today's visit she is with her sister.  She tells me that since starting the diltiazem, her atrial fibrillation burden has decreased significantly.    Past Medical History:  Diagnosis Date   Atrial fibrillation (HCC)    Atrial flutter (HCC)    COPD (chronic obstructive pulmonary disease) (HCC)     Past Surgical History:  Procedure Laterality Date   ABDOMINAL HYSTERECTOMY     CARDIOVERSION N/A 05/10/2021   Procedure: CARDIOVERSION;  Surgeon: Jake Bathe, MD;  Location: [facility] ENDOSCOPY;  Service: Cardiovascular;  Laterality: N/A;   TEE WITHOUT CARDIOVERSION N/A 05/10/2021   Procedure: TRANSESOPHAGEAL ECHOCARDIOGRAM (TEE);  Surgeon: Jake Bathe, MD;  Location: [facility] ENDOSCOPY;  Service: Cardiovascular;  Laterality: N/A;     Current Medications: Current Meds  Medication Sig   acyclovir (ZOVIRAX) 400 MG tablet Take 400 mg by mouth 3 (three) times daily as needed.   apixaban (ELIQUIS) 5 MG TABS tablet Take 5 mg by mouth 2 (two) times daily.   atorvastatin (LIPITOR) 10 MG tablet Take 10 mg by mouth at bedtime.   Calcium Polycarbophil (FIBER-CAPS PO) Take 1 capsule by mouth daily.   chlorpheniramine (CHLOR-TRIMETON) 4 MG tablet Take 4 mg by mouth 2 (two) times daily as needed for allergies.   Cholecalciferol 10 MCG (400 UNIT) CAPS Take 800 Units by mouth in the morning and at bedtime.   clobetasol ointment (TEMOVATE) 0.05 % Apply 1 application topically daily as needed.   diltiazem (TIAZAC) 240 MG 24 hr capsule Take 240 mg by mouth daily.   Fluticasone-Umeclidin-Vilant (TRELEGY ELLIPTA) 200-62.5-25 MCG/INH AEPB Inhale 1 puff into the lungs daily.   Ketotifen Fumarate (ALAWAY OP) Place 1 drop into both eyes daily as needed (allergies).   levalbuterol (XOPENEX HFA) 45 MCG/ACT inhaler Inhale 1-2 puffs into the lungs every 4 (four) hours as needed for shortness of breath or wheezing.   levothyroxine (SYNTHROID) 50 MCG tablet Take 50 mcg by mouth daily.   losartan (COZAAR) 50 MG tablet Take 50 mg by mouth daily.   Lutein-Zeaxanthin 25-5 MG CAPS Take 1 capsule by mouth at bedtime.   montelukast (SINGULAIR) 10 MG tablet Take 10 mg by mouth at bedtime.   pantoprazole (PROTONIX) 40 MG tablet Take 40 mg by mouth daily.     Allergies:  Penicillins, Quinapril, Latex, Formaldehyde, and Other   Social History   Socioeconomic History   Marital status: Widowed    Spouse name: Not on file   Number of children: Not on file   Years of education: Not on file   Highest education level: Not on file  Occupational History   Not on file  Tobacco Use   Smoking status: Former    Types: Cigarettes   Smokeless tobacco: Never  Substance and Sexual Activity   Alcohol use: Yes    Comment: drinks 1 drink per day of all the  above   Drug use: Never   Sexual activity: Not on file  Other Topics Concern   Not on file  Social History Narrative   Not on file   Social Determinants of Health   Financial Resource Strain: Not on file  Food Insecurity: Not on file  Transportation Needs: Not on file  Physical Activity: Not on file  Stress: Not on file  Social Connections: Not on file     Family History: The patient's family history is not on file.  ROS:   Please see the history of present illness.    All other systems reviewed and are negative.  EKGs/Labs/Other Studies Reviewed:    The following studies were reviewed today:  05/13/2021 ECG AF   EKG:  The ekg ordered today demonstrates sinus rhythm.  First-degree AV delay.  No preexcitation.  QTC is 460 ms.  Recent Labs: 05/08/2021: B Natriuretic Peptide 106.1; TSH 1.366 05/10/2021: BUN 17; Creatinine, Ser 0.68; Hemoglobin 13.1; Platelets 281; Potassium 4.4; Sodium 138  Recent Lipid Panel No results found for: CHOL, TRIG, HDL, CHOLHDL, VLDL, LDLCALC, LDLDIRECT  Physical Exam:    VS:  BP 140/80 (BP Location: Left Arm, Patient Position: Sitting, Cuff Size: Normal)   Pulse 61   Ht 5\' 9"  (1.753 m)   Wt 175 lb (79.4 kg)   SpO2 96%   BMI 25.84 kg/m     Wt Readings from Last 3 Encounters:  06/19/21 175 lb (79.4 kg)  05/10/21 177 lb (80.3 kg)     GEN:  Well nourished, well developed in no acute distress HEENT: Normal NECK: No JVD; No carotid bruits LYMPHATICS: No lymphadenopathy CARDIAC: RRR, no murmurs, rubs, gallops RESPIRATORY:  Clear to auscultation without rales, wheezing or rhonchi  ABDOMEN: Soft, non-tender, non-distended MUSCULOSKELETAL:  No edema; No deformity  SKIN: Warm and dry NEUROLOGIC:  Alert and oriented x 3 PSYCHIATRIC:  Normal affect   ASSESSMENT:    1. Persistent atrial fibrillation (HCC)   2. Primary hypertension    PLAN:    In order of problems listed above:   #Persistent atrial fibrillation The patient presents  to establish care for her persistent atrial fibrillation that was previously managed in [facility].  She has had multiple interventions for her A. fib including 2 percutaneous ablations and 1 convergent procedure.  She is maintaining sinus rhythm for now.  She tells me the control of her A. fib is better on diltiazem.  We discussed the management options moving forward for her atrial fibrillation.  If she has more atrial fibrillation in the future, could consider remapping the left atrium to confirm the posterior wall and veins were completely isolated after her convergent procedure.  The only other alternative would be initiation of an antiarrhythmic drug such as amiodarone or implantation of a permanent pacemaker with AV junction ablation.  Given she is maintaining normal sinus rhythm, I do not think implant of  a pacemaker with an AV junction ablation is a good option for now.  We did discuss the redo ablation procedure in detail during today's visit.  For now, we will plan to touch base in 6 months to see how she is doing and to see how the burden of atrial fibrillation has been.  She should continue the current medical therapy.  She should continue Eliquis for stroke prophylaxis.  We will work on obtaining medical records from her [facility] physicians including the ablation records.   #Hypertension Controlled.  Continue to monitor at home.  Continue current medical therapy including losartan.   Follow-up 6 months    Total time spent with patient today 50 minutes. This includes reviewing records, evaluating the patient and coordinating care.  Medication Adjustments/Labs and Tests Ordered: Current medicines are reviewed at length with the patient today.  Concerns regarding medicines are outlined above.  Orders Placed This Encounter  Procedures   EKG 12-Lead   No orders of the defined types were placed in this encounter.    Signed, Rossie Muskrat. Lalla Brothers, MD, [facility],  [facility] 06/19/2021 5:33 PM    Electrophysiology [facility]

## 2021-06-19 ENCOUNTER — Ambulatory Visit: Payer: Medicare Other | Admitting: Cardiology

## 2021-06-19 ENCOUNTER — Other Ambulatory Visit: Payer: Self-pay

## 2021-06-19 ENCOUNTER — Encounter: Payer: Self-pay | Admitting: Cardiology

## 2021-06-19 VITALS — BP 140/80 | HR 61 | Ht 69.0 in | Wt 175.0 lb

## 2021-06-19 DIAGNOSIS — I10 Essential (primary) hypertension: Secondary | ICD-10-CM

## 2021-06-19 DIAGNOSIS — I4819 Other persistent atrial fibrillation: Secondary | ICD-10-CM | POA: Diagnosis not present

## 2021-06-19 NOTE — Patient Instructions (Addendum)
Medication Instructions:  Your physician recommends that you continue on your current medications as directed. Please refer to the Current Medication list given to you today. *If you need a refill on your cardiac medications before your next appointment, please call your pharmacy*  Lab Work: None ordered. If you have labs (blood work) drawn today and your tests are completely normal, you will receive your results only by: MyChart Message (if you have MyChart) OR A paper copy in the mail If you have any lab test that is abnormal or we need to change your treatment, we will call you to review the results.  Testing/Procedures: None ordered.  Follow-Up: At [facility], you and your health needs are our priority.  As part of our continuing mission to provide you with exceptional heart care, we have created designated Provider Care Teams.  These Care Teams include your primary Cardiologist (physician) and Advanced Practice Providers (APPs -  Physician Assistants and Nurse Practitioners) who all work together to provide you with the care you need, when you need it.  Your next appointment:   Your physician wants you to follow-up in: 6 months with Dr. Lalla Brothers at [facility] office.   You will receive a reminder letter in the mail two months in advance. If you don't receive a letter, please call our office to schedule the follow-up appointment.  AF[facility] CLINIC INFORMATION:  The AFib Clinic is located in the Heart and Vascular Specialty Clinics at [facility]. Parking instructions/directions: Government social research officer C ([address]). When you pull in to Entrance C, there is an underground parking garage to your right. The code to enter the garage is ________________. Take the elevators to the first floor. Follow the signs to the Heart and Vascular Specialty Clinics. You will see registration at the end of the hallway.  Phone number: [phone]

## 2021-07-23 ENCOUNTER — Other Ambulatory Visit: Payer: Self-pay

## 2021-07-23 ENCOUNTER — Ambulatory Visit: Payer: Medicare Other | Admitting: Pulmonary Disease

## 2021-07-23 ENCOUNTER — Encounter: Payer: Self-pay | Admitting: Pulmonary Disease

## 2021-07-23 VITALS — BP 142/82 | HR 67 | Temp 97.7°F | Ht 69.0 in | Wt 177.8 lb

## 2021-07-23 DIAGNOSIS — J449 Chronic obstructive pulmonary disease, unspecified: Secondary | ICD-10-CM | POA: Diagnosis not present

## 2021-07-23 DIAGNOSIS — J189 Pneumonia, unspecified organism: Secondary | ICD-10-CM | POA: Diagnosis not present

## 2021-07-23 NOTE — Progress Notes (Signed)
Subjective:   PATIENT ID: Christine Moore GENDER: female DOB: Apr 26, 1946, MRN: [id number]   HPI  Chief Complaint  Patient presents with   Consult    Pt states shes doing fine.    Reason for Visit: New consult for COPD/asthma  [name] is a 75 year old female with asthma, atrial fibrillation s/p ablations and cardioversions, HTN, HLD and chronic headaches who presents as new consult for asthma/COPD management.  Her PCP noted from 06/14/21 by Dr. Docia Chuck was reviewed. She recently established care after moving from [facility] and was followed by Pulmonology.  She was diagnosed with asthma as a teen and COPD in 2019. She recently had pneumonia and completed antibiotics in July. She is on Trelegy, xopenex and Singulair. She reports that she has had pneumonia four times in the last 12 months. She states persistent productive cough that previously interrupted her ability to speak and be in public. Since discharge, she has been taking mucinex twice a day with significant improvement.  Unclear if this is due to medications or due to moving to [place].  No longer having any limitations in activity.   Social History: Former smoker. Smoked 1 ppd x 10 years. Quit in 1985. Retired Runner, broadcasting/film/video Significant second smoke exposure Mom had asthma Moving in with her sister  I have personally reviewed patient's past medical/family/social history, allergies, current medications.  Past Medical History:  Diagnosis Date   Atrial fibrillation (HCC)    Atrial flutter (HCC)    COPD (chronic obstructive pulmonary disease) (HCC)      Family History  Problem Relation Age of Onset   Melanoma Sister    Breast cancer Sister    Endometrial cancer Sister      Social History   Occupational History   Not on file  Tobacco Use   Smoking status: Former    Packs/day: 1.00    Years: 22.00    Pack years: 22.00    Types: Cigarettes    Start date: 1963    Quit date: 1985    Years since  quitting: 37.6   Smokeless tobacco: Never  Substance and Sexual Activity   Alcohol use: Yes    Comment: drinks 1 drink per day of all the above   Drug use: Never   Sexual activity: Not on file    Allergies  Allergen Reactions   Penicillins Swelling    Unable to recall  Swelling not throat/face    Quinapril Swelling    Swelling of face Swelling of face    Latex Itching   Formaldehyde Itching   Other Rash     Outpatient Medications Prior to Visit  Medication Sig Dispense Refill   acyclovir (ZOVIRAX) 400 MG tablet Take 400 mg by mouth 3 (three) times daily as needed.     apixaban (ELIQUIS) 5 MG TABS tablet Take 5 mg by mouth 2 (two) times daily.     atorvastatin (LIPITOR) 10 MG tablet Take 10 mg by mouth at bedtime.     clobetasol ointment (TEMOVATE) 0.05 % Apply 1 application topically daily as needed.     Fluticasone-Umeclidin-Vilant (TRELEGY ELLIPTA) 200-62.5-25 MCG/INH AEPB Inhale 1 puff into the lungs daily.     levalbuterol (XOPENEX HFA) 45 MCG/ACT inhaler Inhale 1-2 puffs into the lungs every 4 (four) hours as needed for shortness of breath or wheezing.     levothyroxine (SYNTHROID) 50 MCG tablet Take 50 mcg by mouth daily.     losartan (COZAAR) 50 MG tablet Take 50  mg by mouth daily.     Lutein-Zeaxanthin 25-5 MG CAPS Take 1 capsule by mouth at bedtime.     montelukast (SINGULAIR) 10 MG tablet Take 10 mg by mouth at bedtime.     pantoprazole (PROTONIX) 40 MG tablet Take 40 mg by mouth daily.     Calcium Polycarbophil (FIBER-CAPS PO) Take 1 capsule by mouth daily.     chlorpheniramine (CHLOR-TRIMETON) 4 MG tablet Take 4 mg by mouth 2 (two) times daily as needed for allergies.     Cholecalciferol 10 MCG (400 UNIT) CAPS Take 800 Units by mouth in the morning and at bedtime.     diltiazem (TIAZAC) 240 MG 24 hr capsule Take 240 mg by mouth daily.     Ketotifen Fumarate (ALAWAY OP) Place 1 drop into both eyes daily as needed (allergies).     No facility-administered  medications prior to visit.    Review of Systems  Constitutional:  Negative for chills, diaphoresis, fever, malaise/fatigue and weight loss.  HENT:  Positive for congestion. Negative for ear pain and sore throat.   Respiratory:  Positive for sputum production and shortness of breath. Negative for cough, hemoptysis and wheezing.   Cardiovascular:  Positive for palpitations. Negative for chest pain and leg swelling.  Gastrointestinal:  Positive for heartburn. Negative for abdominal pain and nausea.  Genitourinary:  Negative for frequency.  Musculoskeletal:  Negative for joint pain and myalgias.  Skin:  Negative for itching and rash.  Neurological:  Negative for dizziness, weakness and headaches.  Endo/Heme/Allergies:  Does not bruise/bleed easily.  Psychiatric/Behavioral:  Negative for depression. The patient is not nervous/anxious.     Objective:   Vitals:   07/23/21 0918  BP: (!) 142/82  Pulse: 67  Temp: 97.7 F (36.5 C)  TempSrc: Oral  SpO2: 96%  Weight: 177 lb 12.8 oz (80.6 kg)  Height: 5\' 9"  (1.753 m)   SpO2: 96 % O2 Device: None (Room air)  Physical Exam: General: Well-appearing, no acute distress HENT: [place], AT Eyes: EOMI, no scleral icterus Respiratory: Clear to auscultation bilaterally.  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  We reviewed her history which included a persistent cough.  We reviewed her chest  imaging which demonstrates dense left lower lobe consolidation.  It is possible that this area poses a high risk for recurrent pneumonia however unable to visualize that her parenchyma to determine if there is any focal bronchiectasis or any other abnormality on available imaging.  Her cough has nearly resolved on Mucinex and she has no signs of infections symptoms.  We discussed titrating off of Mucinex and seeing if her symptoms recur.  For now I recommend conservative management allowing her lungs to recover and heal.  When she does develop respiratory symptoms concerning for pneumonia, she may need a longer course of antibiotics for 10 to 14 days.  COPD-asthma overlap  - well-controlled --CONTINUE Trelegy 200-62.5-25 mcg ONE puff ONCE a day. Call for refills --CONTINUE singulair 10 mg daily --Discussed weaning  Mucinex to once daily for one week, followed by every other day. Restart if cough returns and let our office know   Recurrent pneumonia Dense consolidation in LLL. Likely area high risk for recurrent pneumonia --No indication for repeat CT since clinically doing well --In the future if you develop s/sx concerning for pneumonia, will need two week course of antibiotics  **If needs CT need in the future, she has used alprazolam 1-2 mg PRN for anxiety**  Health Maintenance Immunization History  Administered Date(s) Administered   Moderna Sars-Covid-2 Vaccination 12/20/2019, 01/17/2020, 10/08/2020   CT Lung Screen - discuss for CT  No orders of the defined types were placed in this encounter. No orders of the defined types were placed in this encounter.   Return in about 6 months (around 01/23/2022).  I have spent a total time of 45-minutes on the day of the appointment reviewing prior documentation, coordinating care and discussing medical diagnosis and plan with the patient/family. Imaging, labs and tests included in this note have been reviewed and interpreted independently by  me.  Pebble Botkin Mechele Collin, MD [facility] 07/23/2021 9:27 AM  Office Number [phone]

## 2021-07-23 NOTE — Patient Instructions (Addendum)
COPD-asthma overlap  - well-controlled --CONTINUE Trelegy 200-62.5-25 mcg ONE puff ONCE a day. Call for refills --CONTINUE singulair 10 mg daily --Discussed weaning Mucinex to once daily for one week, followed by every other day. Restart if cough returns and let our office know   Recurrent pneumonia Dense consolidation in LLL. Likely area high risk for recurrent pneumonia --No indication for repeat CT since clinically doing well --In the future if you develop s/sx concerning for pneumonia, will need two week course of antibiotics  Follow-up with me in 6 months

## 2021-07-24 ENCOUNTER — Encounter: Payer: Self-pay | Admitting: Pulmonary Disease

## 2021-07-24 DIAGNOSIS — J4489 Other specified chronic obstructive pulmonary disease: Secondary | ICD-10-CM | POA: Insufficient documentation

## 2021-07-24 DIAGNOSIS — J449 Chronic obstructive pulmonary disease, unspecified: Secondary | ICD-10-CM | POA: Insufficient documentation

## 2021-08-12 ENCOUNTER — Telehealth: Payer: Self-pay | Admitting: Cardiology

## 2021-08-12 ENCOUNTER — Other Ambulatory Visit: Payer: Self-pay | Admitting: *Deleted

## 2021-08-12 MED ORDER — APIXABAN 5 MG PO TABS: 5.0000 mg | ORAL_TABLET | Freq: Two times a day (BID) | ORAL | 6 refills | Status: DC

## 2021-08-12 MED ORDER — DILTIAZEM HCL ER BEADS 240 MG PO CP24: 240.0000 mg | ORAL_CAPSULE | Freq: Every day | ORAL | 2 refills | Status: DC

## 2021-08-12 MED ORDER — LEVOFLOXACIN 750 MG PO TABS
750.0000 mg | ORAL_TABLET | Freq: Every day | ORAL | 0 refills | Status: DC
Start: 2021-08-12 — End: 2021-08-14

## 2021-08-12 MED ORDER — LOSARTAN POTASSIUM 50 MG PO TABS: 50.0000 mg | ORAL_TABLET | Freq: Every day | ORAL | 2 refills | Status: DC

## 2021-08-12 NOTE — Telephone Encounter (Signed)
This is a [place] pt 

## 2021-08-12 NOTE — Telephone Encounter (Signed)
[facility] Telephone Encounter  Returned patient call. She has worsening productive cough despite mucinex associated with fatigue and difficulty sleeping.  Rx levaquin 750 mg x 14 days Continue mucinex BID If symptoms worsen despite antibiotics, will need evaluation in clinic

## 2021-08-12 NOTE — Telephone Encounter (Signed)
Attempted to call the patient to notify her the her 3 RX's have been sent to the pharmacy. No answer- I left a message on her voice mail (ok per DPR) that these have been sent in as requested and to please call back with any further questions/ concerns.

## 2021-08-12 NOTE — Telephone Encounter (Signed)
Diltiazem/Losartan Refill request forwarded to Providers nurse for review.

## 2021-08-12 NOTE — Telephone Encounter (Signed)
Request for Eliquis Refill.

## 2021-08-12 NOTE — Telephone Encounter (Signed)
Patient called back stating she called the pharmacy and they said they have not received the refill. Informed patient the refill request was sent to the refill time and is awaiting approval from Provider's nurse. She states she was told Dr. Lovena Neighbours nurse is out of the office this week and would like to make sure someone will refill the medication.

## 2021-08-12 NOTE — Addendum Note (Signed)
Addended by: Sherri Rad C on: 08/12/2021 12:22 PM   Modules accepted: Orders

## 2021-08-12 NOTE — Telephone Encounter (Signed)
Reviewed the patient's chart. Last seen on 06/19/21 with Dr. Lalla Brothers. Her medication list at that time confirmed she was taking: - Diltiazem 240 mg once daily  - Losartan 50 mg once daily   Per Dr. Lovena Neighbours note, the patient was to continue her current medications. Will refill both of the above medications as requested.

## 2021-08-12 NOTE — Telephone Encounter (Signed)
Dr. [name] please advise on patient mychart message. Thank you    Dr. [name],  My cough has come back.  I tried to cut back on the Mucinex like you asked, but my cough started up again.  It's not as bad as it was before my last treatment for pneumonia, but I don't want it to get any worse.  You indicated when we talked that if the cough came back, you might consider putting me on a 2 week course of anitbiotics in case the pneumonia never went completely away.  Is that what you want to do at this point?  I am coughing up yellow mucous and it is making it hard to get the sleep I need.  Please let me know.  Thanks, Christine Moore.

## 2021-08-12 NOTE — Telephone Encounter (Signed)
Eliquis 5 mg refill request received. Patient is y75 ears old, weight-80.6 kg, Crea- 0.68 on 05/10/21, Diagnosis-afib, and last seen by Dr. Lalla Brothers on 06/19/21. Dose is appropriate based on dosing criteria. Will send in refill to requested pharmacy.

## 2021-08-12 NOTE — Telephone Encounter (Signed)
*  STAT* If patient is at the pharmacy, call can be transferred to refill team.   1. Which medications need to be refilled? (please list name of each medication and dose if known) new prescriptions for Diltiazem, Eliquis and Losartan  2. Which pharmacy/location (including street and city if local pharmacy) is medication to be sent to? [facility] [address], [place]    3. Do they need a 30 day or 90 day supply? 90 days and refills

## 2021-08-14 ENCOUNTER — Encounter: Payer: Self-pay | Admitting: Physical Therapy

## 2021-08-14 ENCOUNTER — Other Ambulatory Visit: Payer: Self-pay

## 2021-08-14 ENCOUNTER — Ambulatory Visit: Payer: Medicare Other | Attending: Internal Medicine | Admitting: Physical Therapy

## 2021-08-14 DIAGNOSIS — R2689 Other abnormalities of gait and mobility: Secondary | ICD-10-CM | POA: Diagnosis present

## 2021-08-14 DIAGNOSIS — R278 Other lack of coordination: Secondary | ICD-10-CM

## 2021-08-14 DIAGNOSIS — R32 Unspecified urinary incontinence: Secondary | ICD-10-CM | POA: Diagnosis present

## 2021-08-14 DIAGNOSIS — M6281 Muscle weakness (generalized): Secondary | ICD-10-CM | POA: Diagnosis present

## 2021-08-14 MED ORDER — CEFDINIR 300 MG PO CAPS: 300.0000 mg | ORAL_CAPSULE | Freq: Two times a day (BID) | ORAL | 0 refills | Status: DC

## 2021-08-14 MED ORDER — AZITHROMYCIN 250 MG PO TABS
ORAL_TABLET | ORAL | 0 refills | Status: DC
Start: 2021-08-14 — End: 2021-11-08

## 2021-08-14 NOTE — Therapy (Signed)
[facility] [address], [address] [place], [place], [address] Phone: [phone]   Fax:  [phone]  Physical Therapy Evaluation  Patient Details  Name: Christine Moore MRN: [id number] Date of Birth: November 05, 1946 Referring Provider (PT): Dillard Cannon. Criselda Peaches   Encounter Date: 08/14/2021   PT End of Session - 08/14/21 1236     Visit Number 1    Date for PT Re-Evaluation 11/06/21    Authorization Type [employer] medicare    Authorization - Visit Number 1    Authorization - Number of Visits 10    PT Start Time 1145    PT Stop Time 1235    PT Time Calculation (min) 50 min    Activity Tolerance Patient tolerated treatment well    Behavior During Therapy WFL for tasks assessed/performed             Past Medical History:  Diagnosis Date   Atrial fibrillation (HCC)    Atrial flutter (HCC)    COPD (chronic obstructive pulmonary disease) (HCC)     Past Surgical History:  Procedure Laterality Date   ABDOMINAL HYSTERECTOMY     CARDIOVERSION N/A 05/10/2021   Procedure: CARDIOVERSION;  Surgeon: Jake Bathe, MD;  Location: [facility] END[facility];  Service: Cardiovascular;  Laterality: N/A;   TEE WITHOUT CARDIOVERSION N/A 05/10/2021   Procedure: TRANSESOPHAGEAL ECHOCARDIOGRAM (TEE);  Surgeon: Jake Bathe, MD;  Location: [facility] ENDOSCOPY;  Service: Cardiovascular;  Laterality: N/A;    There were no vitals filed for this visit.    Subjective Assessment - 08/14/21 1152     Subjective Patient has had issues with urinary leakage since she was little.Patient saw a urogynecologist. Patient has surgery for a cystocele and rectocele repair and the incontinence is worse. Patient urine just runs out. Patient had surgery 07/2020. In the hospital from 05/07/2021 to 05/10/2021 due to Afib and pnumonia. The therapist in the hospital wanted her to get cane and work on balance.    Pertinent History COPD; Afib; Hypothyroidism; Aflutter    Patient Stated Goals reduce  leakage, better stool, learn exercise    Currently in Pain? No/denies    Multiple Pain Sites No                [facility] PT Assessment - 08/14/21 0001       Assessment   Medical Diagnosis R2681; M62.81 Muscle weakness and generalized weakness    Referring Provider (PT) Irving Burton B. Mullen    Onset Date/Surgical Date --   07/2020   Prior Therapy none      Precautions   Precautions None      Restrictions   Weight Bearing Restrictions No      Balance Screen   Has the patient fallen in the past 6 months No    Has the patient had a decrease in activity level because of a fear of falling?  Yes    Is the patient reluctant to leave their home because of a fear of falling?  No      Home [employer] information centre manager residence      Prior Function   Level of Independence Independent    Leisure no exercise routine      Cognition   Overall Cognitive Status Within Functional Limits for tasks assessed      Posture/Postural Control   Posture/Postural Control Postural limitations    Postural Limitations Flexed trunk      ROM / Strength   AROM / PROM /  Strength AROM;PROM;Strength      AROM   Lumbar Extension decreased by 25% with her feeling dizzy      PROM   Right Hip External Rotation  70    Left Hip Flexion 120    Left Hip External Rotation  70      Strength   Right Hip Extension 3+/5    Right Hip External Rotation  4/5    Right Hip Internal Rotation 4/5    Right Hip ABduction 3+/5    Left Hip Extension 3+/5    Left Hip ABduction 3+/5      Palpation   Palpation comment tenderness located in the left lower quadrant              No emotional/communication barriers or cognitive limitation. Patient is motivated to learn. Patient understands and agrees with treatment goals and plan. PT explains patient will be examined in standing, sitting, and lying down to see how their muscles and joints work. When they are ready, they will be asked to remove their underwear  so PT can examine their perineum. The patient is also given the option of providing their own chaperone as one is not provided in our facility. The patient also has the right and is explained the right to defer or refuse any part of the evaluation or treatment including the internal exam. With the patient's consent, PT will use one gloved finger to gently assess the muscles of the pelvic floor, seeing how well it contracts and relaxes and if there is muscle symmetry. After, the patient will get dressed and PT and patient will discuss exam findings and plan of care. PT and patient discuss plan of care, schedule, attendance policy and HEP activities.           Objective measurements completed on examination: See above findings.     Pelvic Floor Special Questions - 08/14/21 0001     Prior Pregnancies No    Currently Sexually Active No    Urinary Leakage Yes    Pad use changes pads 2 times per day for the maximum abdorbancy; wears depends for maximum abdorbancy 2 pads    Activities that cause leaking With strong urge;Coughing;Sneezing;Laughing;Bending;Other    Other activities that cause leaking getting up and down from a chair,    Urinary urgency Yes   unable to wait   Urinary frequency urinate 8x AM between 6-12; afternoon urinates 2 hours; night time gets up every 2 hours    Fecal incontinence No   strain to have a bowel movement; 4 small BM; most BM are Type 1 and sometimes Type4   Fluid intake drinks decaff coffee, water, wine in evening 6 PM 2 small glasses    Caffeine beverages nons    Skin Integrity Intact;Other    Skin Integrity other dry    Prolapse None    Pelvic Floor Internal Exam Patient confirms identification and approves PT to assess pelvic floor and treatment    Exam Type Vaginal;Rectal    Palpation decreased movement of the puborectalis, reduction of contraction of IAS; tightness in the introitus; difficulty relaxing after a contraction    Strength weak squeeze, no lift                          PT Short Term Goals - 08/14/21 1437       PT SHORT TERM GOAL #1   Title independent with pelvic floor strength and balance exercises  Time 12    Period Weeks    Status New    Target Date 09/11/21      PT SHORT TERM GOAL #2   Title understand how to perform abdominal massage to reduce the amount of Type 1 stool by 50% and understand the importance of fiber and hydration    Time 12    Period Weeks    Status New    Target Date 09/11/21      PT SHORT TERM GOAL #3   Title educated on vaginal health and care to the tissue to reduce irritation    Time 12    Period Weeks    Status New    Target Date 09/11/21      PT SHORT TERM GOAL #4   Title education on correct toileting technique to fully expel her stool    Time 4    Period Weeks    Status New    Target Date 09/11/21               PT Long Term Goals - 08/14/21 1439       PT LONG TERM GOAL #1   Title Independent with advanced pelvic foor, core, and hip strength    Time 12    Period Weeks    Status New    Target Date 11/06/21      PT LONG TERM GOAL #2   Title reduction of nightime voids to 2 times per night due to using behavioral techniques and increased pelvic floor strength    Time 12    Period Weeks    Status New    Target Date 11/06/21      PT LONG TERM GOAL #3   Title bowel movements that are Type 4 are the size of a thumb due to improved relaxation of the pelvic floor muscles as the stool is being expeled    Time 12    Period Weeks    Status New    Target Date 11/06/21      PT LONG TERM GOAL #4   Title reduction of pads to light incontinence pads due to urinary leakage decreased >/= 75%    Time 12    Period Weeks    Status New    Target Date 11/06/21      PT LONG TERM GOAL #5   Title Patient balance is improved so her fear of falling during her daily tasks has decreased >/= 75%    Time 12    Period Weeks    Status New    Target Date 11/06/21                     Plan - 08/14/21 1426     Clinical Impression Statement Patient is a 75 year old female with urinary leakage since she was a child. Her leakage became worse after she had a cystocele and rectocele repair 07/2020. She reports there was an extra hole between the anus and vagina but therapist did not notice it. Pelvic floor strength is 2/5 for vaginal and rectal. Puborectalis does not come forward with contraction. The internal anal sphincter barely contracted. The pelvic floor has difficulty with relaxing after a contraction. She will leak with strong urge, cough, sneeze, bending forward, and getting up from a chair. Stool Type is 1 and sometimes is a Type 4. She does not strain to have a bowel movement. Patient reports when she has t Type 4 bowel movement it will  be skinny. Patient wears a depends maxi during the day and morning. She wil urinate every 2 hours during the night. In the morning she urinates 8 times and afternoon is every 2 hours. Patient has decreased lumbar extension by 25%. She has decreased hip strength. Her pulmonologist suggest her to walk daily but she has not done this yet. Patient ambulates with decreased hip extension and flexed trunk. Patient will hold onto her walls or items at home due to fear of falling. Patient will benefit from skilled therapy to improve strength and coordinaiton of the pelvic floor to reduce leakage and improve overall strength ot improve balance.    Personal Factors and Comorbidities Comorbidity 3+;Time since onset of injury/illness/exacerbation;Fitness;Age    Comorbidities COPD; Hypothyroidism, Afib;    Examination-Activity Limitations Continence;Toileting;Bend;Locomotion Level    Examination-Participation Restrictions Cleaning;Community Activity    Stability/Clinical Decision Making Evolving/Moderate complexity    Clinical Decision Making Moderate    Rehab Potential Good    PT Frequency 2x / week    PT Duration 12 weeks    PT  Treatment/Interventions ADLs/Self Care Home Management;Aquatic Therapy;Biofeedback;Electrical Stimulation;Therapeutic activities;Therapeutic exercise;Balance training;Neuromuscular re-education;Patient/family education;Manual techniques;Passive range of motion;Joint Manipulations    PT Next Visit Plan do the sit to stand test and TUG test; when in aquatic work on balance, core and Lower extremity strength; pelvic floor work on mobility of the pelvic floor tissue, how to toilet, urge to void, pelvic floor contraction without holding breath, abdominal massage    Consulted and Agree with Plan of Care Patient             Patient will benefit from skilled therapeutic intervention in order to improve the following deficits and impairments:  Decreased coordination, Decreased range of motion, Difficulty walking, Increased fascial restricitons, Increased muscle spasms, Decreased activity tolerance, Decreased mobility, Decreased strength, Decreased balance  Visit Diagnosis: Muscle weakness (generalized) - Plan: PT plan of care cert/re-cert  Other lack of coordination - Plan: PT plan of care cert/re-cert  Other abnormalities of gait and mobility - Plan: PT plan of care cert/re-cert  Urinary incontinence, unspecified type - Plan: PT plan of care cert/re-cert     Problem List Patient Active Problem List   Diagnosis Date Noted   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, [address] [place], [place], [address] Phone: [phone]   Fax:  [phone]  Name: Christine Moore MRN: [id number] Date of Birth: 06-26-1946

## 2021-08-14 NOTE — Addendum Note (Signed)
Addended by: Mechele Collin on: 08/14/2021 05:23 PM   Modules accepted: Orders

## 2021-08-14 NOTE — Telephone Encounter (Signed)
Patient prefers not to take levofloxacin due to listed side effects. She has previously tolerate Cedinir. Will prescribe two week course of Cefdinir and five day course of azithromycin for community acquired pneumonia.

## 2021-08-14 NOTE — Telephone Encounter (Signed)
Dr [name], please advise on pt email, thanks!  [facility] Pool Dr [name], The medication your sent me scares me.  I don't normally worry about meds, but this one has some red flags.  I do suffer from heart rhythm problems (A fib and A flutter) and have had 4 heart ablations.  I take a blood thinner, Eliquis, as well as steroids. I worry about the possibility of tendonitis and neuropathy which can be permanent. After a recent surgery, I was given narcotics and developed delirium, another red flag. My father died of a ruptured aortic aneurysm, so that might be a problem for me.  Leaving out some details since I am restricted for size of this message.  Overall, I was wondering if there is a medication that might be as effective but will not have the side effects of fluoroquinolone.  What do you think?

## 2021-08-15 ENCOUNTER — Telehealth: Payer: Self-pay | Admitting: Pulmonary Disease

## 2021-08-15 NOTE — Telephone Encounter (Signed)
Call made to patient, confirmed DOB. Patient wanted to be sure she was got enough pills. She thought Dr [name] told her over the phone to take the cefdinir  for two weeks. I explained that she should take once capsule twice a day for one week. Voiced understanding.   Nothing further needed at this time.

## 2021-08-15 NOTE — Telephone Encounter (Signed)
Pt states that JE sent in two prescriptions for her. One of which she believes is incorrect: cefdinir. She believes it was to be taken 1 pill every day for 14 days. Instructions state for her to take two pills every day and ony have enough to last a week. Please advise.

## 2021-08-20 ENCOUNTER — Encounter: Payer: Medicare Other | Admitting: Physical Therapy

## 2021-08-23 ENCOUNTER — Encounter: Payer: Self-pay | Admitting: Physical Therapy

## 2021-08-23 ENCOUNTER — Ambulatory Visit: Payer: Medicare Other | Admitting: Physical Therapy

## 2021-08-23 ENCOUNTER — Other Ambulatory Visit: Payer: Self-pay

## 2021-08-23 DIAGNOSIS — M6281 Muscle weakness (generalized): Secondary | ICD-10-CM | POA: Diagnosis not present

## 2021-08-23 DIAGNOSIS — R278 Other lack of coordination: Secondary | ICD-10-CM

## 2021-08-23 DIAGNOSIS — R32 Unspecified urinary incontinence: Secondary | ICD-10-CM

## 2021-08-23 DIAGNOSIS — R2689 Other abnormalities of gait and mobility: Secondary | ICD-10-CM

## 2021-08-23 NOTE — Therapy (Signed)
[facility] [address], [address] [place], [place], [address] Phone: [phone]   Fax:  [phone]  Physical Therapy Treatment  Patient Details  Name: Christine Moore MRN: [id number] Date of Birth: [birth year] Referring Provider (PT): Dillard Cannon. Criselda Peaches   Encounter Date: 08/23/2021   PT End of Session - 08/23/21 1017     Visit Number 2    Date for PT Re-Evaluation 11/06/21    Authorization Type [employer] medicare    Authorization - Visit Number 2    Authorization - Number of Visits 10    PT Start Time 1015    PT Stop Time 1053    PT Time Calculation (min) 38 min    Activity Tolerance Patient tolerated treatment well    Behavior During Therapy WFL for tasks assessed/performed             Past Medical History:  Diagnosis Date   Atrial fibrillation (HCC)    Atrial flutter (HCC)    COPD (chronic obstructive pulmonary disease) (HCC)     Past Surgical History:  Procedure Laterality Date   ABDOMINAL HYSTERECTOMY     CARDIOVERSION N/A 05/10/2021   Procedure: CARDIOVERSION;  Surgeon: Jake Bathe, MD;  Location: [facility] ENDOSCOPY;  Service: Cardiovascular;  Laterality: N/A;   TEE WITHOUT CARDIOVERSION N/A 05/10/2021   Procedure: TRANSESOPHAGEAL ECHOCARDIOGRAM (TEE);  Surgeon: Jake Bathe, MD;  Location: [facility] ENDOSCOPY;  Service: Cardiovascular;  Laterality: N/A;    There were no vitals filed for this visit.   Subjective Assessment - 08/23/21 1018     Pertinent History COPD; Afib; Hypothyroidism; Aflutter    Currently in Pain? No/denies                               [facility] Adult PT Treatment/Exercise - 08/23/21 0001       Lumbar Exercises: Supine   Ab Set --   TA with ball and Pilates breath. 2x5 Gave for HEP   Other Supine Lumbar Exercises Standing hip abd Bil and extension Bil 10x: added to HEP                     PT Education - 08/23/21 1031     Education Details HEP, core anatomy  and function    Person(s) Educated Patient    Methods Explanation;Demonstration;Tactile cues;Verbal cues    Comprehension Verbalized understanding;Returned demonstration              PT Short Term Goals - 08/14/21 1437       PT SHORT TERM GOAL #1   Title independent with pelvic floor strength and balance exercises    Time 12    Period Weeks    Status New    Target Date 09/11/21      PT SHORT TERM GOAL #2   Title understand how to perform abdominal massage to reduce the amount of Type 1 stool by 50% and understand the importance of fiber and hydration    Time 12    Period Weeks    Status New    Target Date 09/11/21      PT SHORT TERM GOAL #3   Title educated on vaginal health and care to the tissue to reduce irritation    Time 12    Period Weeks    Status New    Target Date 09/11/21      PT SHORT TERM GOAL #4  Title education on correct toileting technique to fully expel her stool    Time 4    Period Weeks    Status New    Target Date 09/11/21               PT Long Term Goals - 08/14/21 1439       PT LONG TERM GOAL #1   Title Independent with advanced pelvic foor, core, and hip strength    Time 12    Period Weeks    Status New    Target Date 11/06/21      PT LONG TERM GOAL #2   Title reduction of nightime voids to 2 times per night due to using behavioral techniques and increased pelvic floor strength    Time 12    Period Weeks    Status New    Target Date 11/06/21      PT LONG TERM GOAL #3   Title bowel movements that are Type 4 are the size of a thumb due to improved relaxation of the pelvic floor muscles as the stool is being expeled    Time 12    Period Weeks    Status New    Target Date 11/06/21      PT LONG TERM GOAL #4   Title reduction of pads to light incontinence pads due to urinary leakage decreased >/= 75%    Time 12    Period Weeks    Status New    Target Date 11/06/21      PT LONG TERM GOAL #5   Title Patient balance is  improved so her fear of falling during her daily tasks has decreased >/= 75%    Time 12    Period Weeks    Status New    Target Date 11/06/21                   Plan - 08/23/21 1017     Clinical Impression Statement Pt was started on initial HEP for basic TA initiation and hip strength for abduction and extension. Pt did a much better TA contarction as she exhaled with more force. PTA emphasized control and buidling awareness of feelin ghe rmuscles better. Pt required 1 bathroom break today.    Personal Factors and Comorbidities Comorbidity 3+;Time since onset of injury/illness/exacerbation;Fitness;Age    Comorbidities COPD; Hypothyroidism, Afib;    Examination-Activity Limitations Continence;Toileting;Bend;Locomotion Level    Examination-Participation Restrictions Cleaning;Community Activity    Stability/Clinical Decision Making Evolving/Moderate complexity    Rehab Potential Good    PT Frequency 2x / week    PT Duration 12 weeks    PT Treatment/Interventions ADLs/Self Care Home Management;Aquatic Therapy;Biofeedback;Electrical Stimulation;Therapeutic activities;Therapeutic exercise;Balance training;Neuromuscular re-education;Patient/family education;Manual techniques;Passive range of motion;Joint Manipulations    PT Next Visit Plan 5x sit to stand test and TUG next, review HEP given today.    PT Home Exercise Plan Access Code [email or web address]    Consulted and Agree with Plan of Care Patient             Patient will benefit from skilled therapeutic intervention in order to improve the following deficits and impairments:  Decreased coordination, Decreased range of motion, Difficulty walking, Increased fascial restricitons, Increased muscle spasms, Decreased activity tolerance, Decreased mobility, Decreased strength, Decreased balance  Visit Diagnosis: Muscle weakness (generalized)  Other lack of coordination  Other abnormalities of gait and mobility  Urinary incontinence,  unspecified type     Problem List Patient Active Problem List   Diagnosis  Date Noted   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, [address] [place], [place], [address] Phone: [phone]   Fax:  [phone]  Name: Christine Moore MRN: [id number] Date of Birth: 08/30/1946

## 2021-08-26 ENCOUNTER — Encounter: Payer: Self-pay | Admitting: Physical Therapy

## 2021-08-26 ENCOUNTER — Ambulatory Visit: Payer: Medicare Other | Admitting: Physical Therapy

## 2021-08-26 ENCOUNTER — Other Ambulatory Visit: Payer: Self-pay

## 2021-08-26 DIAGNOSIS — R2689 Other abnormalities of gait and mobility: Secondary | ICD-10-CM

## 2021-08-26 DIAGNOSIS — R278 Other lack of coordination: Secondary | ICD-10-CM

## 2021-08-26 DIAGNOSIS — R32 Unspecified urinary incontinence: Secondary | ICD-10-CM

## 2021-08-26 DIAGNOSIS — M6281 Muscle weakness (generalized): Secondary | ICD-10-CM

## 2021-08-26 NOTE — Therapy (Signed)
[facility] [address] [place], [place], [address] Phone: [phone]   Fax:  [phone]  Physical Therapy Treatment  Patient Details  Name: Christine Moore MRN: [id number] Date of Birth: 08-23-1946 Referring Provider (PT): Dillard Cannon. Criselda Peaches   Encounter Date: 08/26/2021   PT End of Session - 08/26/21 1059     Visit Number 3    Date for PT Re-Evaluation 11/06/21    Authorization Type UHC medicare    Authorization - Visit Number 3    Authorization - Number of Visits 10    PT Start Time 1059    PT Stop Time 1140    PT Time Calculation (min) 41 min    Activity Tolerance Patient tolerated treatment well    Behavior During Therapy WFL for tasks assessed/performed             Past Medical History:  Diagnosis Date   Atrial fibrillation (HCC)    Atrial flutter (HCC)    COPD (chronic obstructive pulmonary disease) (HCC)     Past Surgical History:  Procedure Laterality Date   ABDOMINAL HYSTERECTOMY     CARDIOVERSION N/A 05/10/2021   Procedure: CARDIOVERSION;  Surgeon: Jake Bathe, MD;  Location: [facility] ENDOSCOPY;  Service: Cardiovascular;  Laterality: N/A;   TEE WITHOUT CARDIOVERSION N/A 05/10/2021   Procedure: TRANSESOPHAGEAL ECHOCARDIOGRAM (TEE);  Surgeon: Jake Bathe, MD;  Location: [facility] ENDOSCOPY;  Service: Cardiovascular;  Laterality: N/A;    There were no vitals filed for this visit.   Subjective Assessment - 08/26/21 1101     Subjective Pt is compliant with HEP for core strength. " I feel different, better, maybe stronger." Having a bowel movement is easier.    Pertinent History COPD; Afib; Hypothyroidism; Aflutter    Currently in Pain? No/denies    Multiple Pain Sites No                [facility] PT Assessment - 08/26/21 0001       Standardized Balance Assessment   Five times sit to stand comments  20 sec no UE      Timed Up and Go Test   Normal TUG (seconds) 10                            [facility] Adult PT Treatment/Exercise - 08/26/21 0001       Lumbar Exercises: Aerobic   Nustep L1 x 6 min PTA present to monitor      Lumbar Exercises: Standing   Other Standing Lumbar Exercises Bil hip abd/extension 10x each. Excellent return demo      Lumbar Exercises: Supine   AB Set Limitations Review with ball squeeze, Pt tends to perform a quick/hard post pelvic tilt. Pt verbally cued to soften herself and try to isolate her lower abs without so much hip work    Clam 5 reps   2 sets added to [employer] Knee Raise --   2x3 Bil, VC to set lower abs first.                    PT Education - 08/26/21 1118     Education Details HEP progression              PT Short Term Goals - 08/14/21 1437       PT SHORT TERM GOAL #1   Title independent with pelvic floor strength and balance exercises  Time 12    Period Weeks    Status New    Target Date 09/11/21      PT SHORT TERM GOAL #2   Title understand how to perform abdominal massage to reduce the amount of Type 1 stool by 50% and understand the importance of fiber and hydration    Time 12    Period Weeks    Status New    Target Date 09/11/21      PT SHORT TERM GOAL #3   Title educated on vaginal health and care to the tissue to reduce irritation    Time 12    Period Weeks    Status New    Target Date 09/11/21      PT SHORT TERM GOAL #4   Title education on correct toileting technique to fully expel her stool    Time 4    Period Weeks    Status New    Target Date 09/11/21               PT Long Term Goals - 08/14/21 1439       PT LONG TERM GOAL #1   Title Independent with advanced pelvic foor, core, and hip strength    Time 12    Period Weeks    Status New    Target Date 11/06/21      PT LONG TERM GOAL #2   Title reduction of nightime voids to 2 times per night due to using behavioral techniques and increased pelvic floor strength    Time 12    Period Weeks     Status New    Target Date 11/06/21      PT LONG TERM GOAL #3   Title bowel movements that are Type 4 are the size of a thumb due to improved relaxation of the pelvic floor muscles as the stool is being expeled    Time 12    Period Weeks    Status New    Target Date 11/06/21      PT LONG TERM GOAL #4   Title reduction of pads to light incontinence pads due to urinary leakage decreased >/= 75%    Time 12    Period Weeks    Status New    Target Date 11/06/21      PT LONG TERM GOAL #5   Title Patient balance is improved so her fear of falling during her daily tasks has decreased >/= 75%    Time 12    Period Weeks    Status New    Target Date 11/06/21                   Plan - 08/26/21 1100     Clinical Impression Statement Pt compliant with initial HEP. She is doing fairly well contacting her lower abdominals. She does have a tendency to make a quick, hard pelvic tilt( especially with fatigue) but in general she is showing improvment finding control and initiation. We added small LE movements to HEP that she will work on this week. TUG and 5x sit to stand were performed for baselines. PT will make long term goals for balance using these metrics. Pt reports today having a bowel movemnet seems esaier to do.    Personal Factors and Comorbidities Comorbidity 3+;Time since onset of injury/illness/exacerbation;Fitness;Age    Comorbidities COPD; Hypothyroidism, Afib;    Examination-Activity Limitations Continence;Toileting;Bend;Locomotion Level    Examination-Participation Restrictions Cleaning;Community Activity    Stability/Clinical Decision Making  Evolving/Moderate complexity    Rehab Potential Good    PT Frequency 2x / week    PT Duration 12 weeks    PT Treatment/Interventions ADLs/Self Care Home Management;Aquatic Therapy;Biofeedback;Electrical Stimulation;Therapeutic activities;Therapeutic exercise;Balance training;Neuromuscular re-education;Patient/family education;Manual  techniques;Passive range of motion;Joint Manipulations    PT Next Visit Plan Set LTG for balance, pt will see PT next visit    PT Home Exercise Plan Access Code [email or web address]    Consulted and Agree with Plan of Care Patient             Patient will benefit from skilled therapeutic intervention in order to improve the following deficits and impairments:  Decreased coordination, Decreased range of motion, Difficulty walking, Increased fascial restricitons, Increased muscle spasms, Decreased activity tolerance, Decreased mobility, Decreased strength, Decreased balance  Visit Diagnosis: Muscle weakness (generalized)  Other lack of coordination  Other abnormalities of gait and mobility  Urinary incontinence, unspecified type     Problem List Patient Active Problem List   Diagnosis Date Noted   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 [place], [place], [address] Phone: [phone]   Fax:  [phone]  Name: Christine Moore MRN: [id number] Date of Birth: 14-Jul-1946  Access Code: [email or web address]: [email or web address]: 09/26/2022Prepared by: Victorino Dike CochranExercises  Supine Transversus Abdominis Bracing - Hands on Stomach - 3 x daily - 7 x weekly - 5 reps  Standing Hip Abduction with Counter Support - 2 x daily - 7 x weekly - 5 reps  Standing Hip Extension with Counter Support - 2 x daily - 7 x weekly - 5 reps  Supine Transversus Abdominis Bracing with Double Leg Fallout - 2 x daily - 7 x weekly - 2 sets - 5 reps  Supine March - 2 x daily - 7 x weekly - 2 sets - 5 reps - 3 hold

## 2021-09-04 ENCOUNTER — Ambulatory Visit: Payer: Medicare Other | Attending: Internal Medicine | Admitting: Physical Therapy

## 2021-09-04 ENCOUNTER — Other Ambulatory Visit: Payer: Self-pay

## 2021-09-04 ENCOUNTER — Encounter: Payer: Self-pay | Admitting: Physical Therapy

## 2021-09-04 DIAGNOSIS — R32 Unspecified urinary incontinence: Secondary | ICD-10-CM | POA: Insufficient documentation

## 2021-09-04 DIAGNOSIS — R2689 Other abnormalities of gait and mobility: Secondary | ICD-10-CM | POA: Insufficient documentation

## 2021-09-04 DIAGNOSIS — R278 Other lack of coordination: Secondary | ICD-10-CM | POA: Insufficient documentation

## 2021-09-04 DIAGNOSIS — M6281 Muscle weakness (generalized): Secondary | ICD-10-CM | POA: Insufficient documentation

## 2021-09-04 NOTE — Patient Instructions (Signed)
About Abdominal Massage  Abdominal massage, also called external colon massage, is a self-treatment circular massage technique that can reduce and eliminate gas and ease constipation. The colon naturally contracts in waves in a clockwise direction starting from inside the right hip, moving up toward the ribs, across the belly, and down inside the left hip.  When you perform circular abdominal massage, you help stimulate your colon's normal wave pattern of movement called peristalsis.  It is most beneficial when done after eating.  Positioning You can practice abdominal massage with oil while lying down, or in the shower with soap.  Some people find that it is just as effective to do the massage through clothing while sitting or standing.  How to Massage Start by placing your finger tips or knuckles on your right side, just inside your hip bone.  . Make small circular movements while you move upward toward your rib cage.   . Once you reach the bottom right side of your rib cage, take your circular movements across to the left side of the bottom of your rib cage.  . Next, move downward until you reach the inside of your left hip bone.  This is the path your feces travel in your colon. . Continue to perform your abdominal massage in this pattern for 10 minutes each day.     You can apply as much pressure as is comfortable in your massage.  Start gently and build pressure as you continue to practice.  Notice any areas of pain as you massage; areas of slight pain may be relieved as you massage, but if you have areas of significant or intense pain, consult with your healthcare provider.  Other Considerations . General physical activity including bending and stretching can have a beneficial massage-like effect on the colon.  Deep breathing can also stimulate the colon because breathing deeply activates the same nervous system that supplies the colon.   . Abdominal massage should always be used in  combination with a bowel-conscious diet that is high in the proper type of fiber for you, fluids (primarily water), and a regular exercise program. [facility] [address], [address] [place], [place] [address] Phone # [phone] Fax [phone]  

## 2021-09-04 NOTE — Therapy (Signed)
[facility] [address] [place], [place], [address] Phone:     Fax:     Physical Therapy Treatment  Patient Details  Name: Christine Moore MRN: [id number] Date of Birth: [birth year] Referring Provider (PT): Dillard Cannon. Criselda Peaches   Encounter Date: 09/04/2021   PT End of Session - 09/04/21 1201     Visit Number 4    Date for PT Re-Evaluation 11/06/21    Authorization Type UHC medicare    Authorization - Visit Number 4    Authorization - Number of Visits 10    PT Start Time 1110   came late due to [place] moving buildings   PT Stop Time 1150    PT Time Calculation (min) 40 min    Activity Tolerance Patient tolerated treatment well;No increased pain    Behavior During Therapy WFL for tasks assessed/performed             Past Medical History:  Diagnosis Date   Atrial fibrillation (HCC)    Atrial flutter (HCC)    COPD (chronic obstructive pulmonary disease) (HCC)     Past Surgical History:  Procedure Laterality Date   ABDOMINAL HYSTERECTOMY     CARDIOVERSION N/A 05/10/2021   Procedure: CARDIOVERSION;  Surgeon: Jake Bathe, MD;  Location: [facility] ENDOSCOPY;  Service: Cardiovascular;  Laterality: N/A;   TEE WITHOUT CARDIOVERSION N/A 05/10/2021   Procedure: TRANSESOPHAGEAL ECHOCARDIOGRAM (TEE);  Surgeon: Jake Bathe, MD;  Location: [facility] ENDOSCOPY;  Service: Cardiovascular;  Laterality: N/A;    There were no vitals filed for this visit.   Subjective Assessment - 09/04/21 1111     Subjective I have been seeing Jenn and it is helping. the leakage is no better. Patient is not straining to have a bowel movement.    Pertinent History COPD; Afib; Hypothyroidism; Aflutter    Patient Stated Goals reduce leakage, better stool, learn exercise    Currently in Pain? No/denies    Multiple Pain Sites No                               [facility] Adult PT Treatment/Exercise - 09/04/21 0001       Neuro Re-ed    Neuro  Re-ed Details  breath work to open up the lower rib cage in sidely, supine and sitting to educated on how to open up her rib cage and move her diaphragm to relax the pelvic floor and reduce the lower rib cage pain from when she had a tube placed in on the left side      Manual Therapy   Manual Therapy Soft tissue mobilization;Myofascial release    Manual therapy comments educated patient on how to perform abdominal massage at home in the AM to improve bowel movements    Soft tissue mobilization circular massage to promote peristalic motion of the intestines for bowel movements; manual work on the diaphragm    Myofascial Release tissue rolling of the lower rib cage in sidely, fascial release of the iliocecal valve and right lower quadrant; release around the bladder and sac of douglas                     PT Education - 09/04/21 1200     Education Details education on abdominal massage and diaphragmatic breathing    Person(s) Educated Patient    Methods Explanation;Demonstration    Comprehension Verbalized understanding;Returned demonstration  PT Short Term Goals - 09/04/21 1206       PT SHORT TERM GOAL #1   Title independent with pelvic floor strength and balance exercises    Time 4    Period Weeks    Status On-going    Target Date 09/11/21      PT SHORT TERM GOAL #2   Title understand how to perform abdominal massage to reduce the amount of Type 1 stool by 50% and understand the importance of fiber and hydration    Time 4    Period Weeks    Status On-going    Target Date 09/11/21      PT SHORT TERM GOAL #3   Title 4    Period Weeks    Status On-going      PT SHORT TERM GOAL #4   Title education on correct toileting technique to fully expel her stool    Time 4    Period Weeks    Status On-going    Target Date 09/11/21               PT Long Term Goals - 08/14/21 1439       PT LONG TERM GOAL #1   Title Independent with advanced pelvic  foor, core, and hip strength    Time 12    Period Weeks    Status New    Target Date 11/06/21      PT LONG TERM GOAL #2   Title reduction of nightime voids to 2 times per night due to using behavioral techniques and increased pelvic floor strength    Time 12    Period Weeks    Status New    Target Date 11/06/21      PT LONG TERM GOAL #3   Title bowel movements that are Type 4 are the size of a thumb due to improved relaxation of the pelvic floor muscles as the stool is being expeled    Time 12    Period Weeks    Status New    Target Date 11/06/21      PT LONG TERM GOAL #4   Title reduction of pads to light incontinence pads due to urinary leakage decreased >/= 75%    Time 12    Period Weeks    Status New    Target Date 11/06/21      PT LONG TERM GOAL #5   Title Patient balance is improved so her fear of falling during her daily tasks has decreased >/= 75%    Time 12    Period Weeks    Status New    Target Date 11/06/21                   Plan - 09/04/21 1201     Clinical Impression Statement Patient feels her balance is getter better. Patient is now able to expand her lower rib cage. Patient had a tube inserted into her lower left rib cage when she had surgery and had pain since then. The pain made it difficult for her to fully expand her rib cage and restrict her diaphragm. Patient is now able to open up her diaphragm and reduce tension in the pelvic floor. Patient reports she is not straining to have a bowel movement. Patient will benefit from skilled therapy to improve her toileting and balance.    Personal Factors and Comorbidities Comorbidity 3+;Time since onset of injury/illness/exacerbation;Fitness;Age    Comorbidities COPD; Hypothyroidism, Afib;  Examination-Activity Limitations Continence;Toileting;Bend;Locomotion Level    Examination-Participation Restrictions Cleaning;Community Activity    Stability/Clinical Decision Making Evolving/Moderate complexity     Rehab Potential Good    PT Frequency 2x / week    PT Duration 12 weeks    PT Treatment/Interventions ADLs/Self Care Home Management;Aquatic Therapy;Biofeedback;Electrical Stimulation;Therapeutic activities;Therapeutic exercise;Balance training;Neuromuscular re-education;Patient/family education;Manual techniques;Passive range of motion;Joint Manipulations    PT Next Visit Plan Pelvic floor-education on hydration and fiber, toileting, urge to void, PTA work on balance, core strength and edurance    PT Home Exercise Plan Access Code [facility]    Recommended Other Services MD signed initial eval    Consulted and Agree with Plan of Care Patient             Patient will benefit from skilled therapeutic intervention in order to improve the following deficits and impairments:  Decreased coordination, Decreased range of motion, Difficulty walking, Increased fascial restricitons, Increased muscle spasms, Decreased activity tolerance, Decreased mobility, Decreased strength, Decreased balance  Visit Diagnosis: Muscle weakness (generalized)  Other lack of coordination  Other abnormalities of gait and mobility  Urinary incontinence, unspecified type     Problem List Patient Active Problem List   Diagnosis Date Noted   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

## 2021-09-06 ENCOUNTER — Ambulatory Visit: Payer: Medicare Other | Admitting: Physical Therapy

## 2021-09-06 ENCOUNTER — Encounter: Payer: Self-pay | Admitting: Physical Therapy

## 2021-09-06 ENCOUNTER — Other Ambulatory Visit: Payer: Self-pay

## 2021-09-06 DIAGNOSIS — M6281 Muscle weakness (generalized): Secondary | ICD-10-CM | POA: Diagnosis not present

## 2021-09-06 DIAGNOSIS — R278 Other lack of coordination: Secondary | ICD-10-CM

## 2021-09-06 DIAGNOSIS — R32 Unspecified urinary incontinence: Secondary | ICD-10-CM

## 2021-09-06 DIAGNOSIS — R2689 Other abnormalities of gait and mobility: Secondary | ICD-10-CM

## 2021-09-06 NOTE — Therapy (Signed)
[facility] [address] [place], [place], [address] Phone: [phone]   Fax:  [phone]  Physical Therapy Treatment  Patient Details  Name: NATARA MONFORT MRN: [id number] Date of Birth: 02-17-1946 Referring Provider (PT): Dillard Cannon. Criselda Peaches   Encounter Date: 09/06/2021   PT End of Session - 09/06/21 0923     Visit Number 5    Date for PT Re-Evaluation 11/06/21    Authorization Type UHC medicare    Authorization - Visit Number 5    Authorization - Number of Visits 10    PT Start Time [phone]    PT Stop Time 1002    PT Time Calculation (min) 39 min    Activity Tolerance Patient tolerated treatment well;No increased pain    Behavior During Therapy WFL for tasks assessed/performed             Past Medical History:  Diagnosis Date   Atrial fibrillation (HCC)    Atrial flutter (HCC)    COPD (chronic obstructive pulmonary disease) (HCC)     Past Surgical History:  Procedure Laterality Date   ABDOMINAL HYSTERECTOMY     CARDIOVERSION N/A 05/10/2021   Procedure: CARDIOVERSION;  Surgeon: Jake Bathe, MD;  Location: [facility] ENDOSCOPY;  Service: Cardiovascular;  Laterality: N/A;   TEE WITHOUT CARDIOVERSION N/A 05/10/2021   Procedure: TRANSESOPHAGEAL ECHOCARDIOGRAM (TEE);  Surgeon: Jake Bathe, MD;  Location: [facility] ENDOSCOPY;  Service: Cardiovascular;  Laterality: N/A;    There were no vitals filed for this visit.   Subjective Assessment - 09/06/21 0923     Subjective Was pretty sore after last session. The pain set in much later in the day, used Biofreeze was helpful.    Pertinent History COPD; Afib; Hypothyroidism; Aflutter    Currently in Pain? No/denies    Multiple Pain Sites No                               [facility] Adult PT Treatment/Exercise - 09/06/21 0001       Neuro Re-ed    Neuro Re-ed Details  Tandem stance/stagger step added for HEP: Single limb stance Bil requires either 2 finger  support for support or toe touch on opposite foot      Lumbar Exercises: Aerobic   Nustep L1 x 6 min PTA present LE only      Lumbar Exercises: Supine   Clam --   2 sets of 5 with green loop added   Dead Bug 5 reps;3 seconds    Bridge 5 reps;2 seconds                     PT Education - 09/06/21 0943     Education Details HEP    Person(s) Educated Patient    Methods Explanation;Demonstration;Verbal cues;Handout    Comprehension Returned demonstration;Verbalized understanding              PT Short Term Goals - 09/04/21 1206       PT SHORT TERM GOAL #1   Title independent with pelvic floor strength and balance exercises    Time 4    Period Weeks    Status On-going    Target Date 09/11/21      PT SHORT TERM GOAL #2   Title understand how to perform abdominal massage to reduce the amount of Type 1 stool by 50% and understand the importance of fiber and  hydration    Time 4    Period Weeks    Status On-going    Target Date 09/11/21      PT SHORT TERM GOAL #3   Title 4    Period Weeks    Status On-going      PT SHORT TERM GOAL #4   Title education on correct toileting technique to fully expel her stool    Time 4    Period Weeks    Status On-going    Target Date 09/11/21               PT Long Term Goals - 08/14/21 1439       PT LONG TERM GOAL #1   Title Independent with advanced pelvic foor, core, and hip strength    Time 12    Period Weeks    Status New    Target Date 11/06/21      PT LONG TERM GOAL #2   Title reduction of nightime voids to 2 times per night due to using behavioral techniques and increased pelvic floor strength    Time 12    Period Weeks    Status New    Target Date 11/06/21      PT LONG TERM GOAL #3   Title bowel movements that are Type 4 are the size of a thumb due to improved relaxation of the pelvic floor muscles as the stool is being expeled    Time 12    Period Weeks    Status New    Target Date 11/06/21       PT LONG TERM GOAL #4   Title reduction of pads to light incontinence pads due to urinary leakage decreased >/= 75%    Time 12    Period Weeks    Status New    Target Date 11/06/21      PT LONG TERM GOAL #5   Title Patient balance is improved so her fear of falling during her daily tasks has decreased >/= 75%    Time 12    Period Weeks    Status New    Target Date 11/06/21                   Plan - 09/06/21 0936     Clinical Impression Statement Pt reports things continue to go well. She was very sore for about a day after manual abdominal work. Soreness did resolve in 1 day with the help of Biofreeze. today balance specific exercises were added to HEP. Pt continues her compliance with total HEP.    Personal Factors and Comorbidities Comorbidity 3+;Time since onset of injury/illness/exacerbation;Fitness;Age    Comorbidities COPD; Hypothyroidism, Afib;    Examination-Activity Limitations Continence;Toileting;Bend;Locomotion Level    Examination-Participation Restrictions Cleaning;Community Activity    Rehab Potential Good    PT Frequency 2x / week    PT Duration 12 weeks    PT Treatment/Interventions ADLs/Self Care Home Management;Aquatic Therapy;Biofeedback;Electrical Stimulation;Therapeutic activities;Therapeutic exercise;Balance training;Neuromuscular re-education;Patient/family education;Manual techniques;Passive range of motion;Joint Manipulations    PT Next Visit Plan Pelvic floor-education on hydration and fiber, toileting, urge to void, PTA work on balance, core strength and edurance    PT Home Exercise Plan Access Code [email or web address]    Consulted and Agree with Plan of Care Patient             Patient will benefit from skilled therapeutic intervention in order to improve the following deficits and impairments:  Decreased coordination, Decreased range of motion,  Difficulty walking, Increased fascial restricitons, Increased muscle spasms, Decreased activity tolerance,  Decreased mobility, Decreased strength, Decreased balance  Visit Diagnosis: Muscle weakness (generalized)  Other lack of coordination  Other abnormalities of gait and mobility  Urinary incontinence, unspecified type     Problem List Patient Active Problem List   Diagnosis Date Noted   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

## 2021-09-09 ENCOUNTER — Ambulatory Visit: Payer: Medicare Other | Admitting: Physical Therapy

## 2021-09-09 ENCOUNTER — Other Ambulatory Visit: Payer: Self-pay

## 2021-09-09 DIAGNOSIS — R278 Other lack of coordination: Secondary | ICD-10-CM

## 2021-09-09 DIAGNOSIS — M6281 Muscle weakness (generalized): Secondary | ICD-10-CM | POA: Diagnosis not present

## 2021-09-09 DIAGNOSIS — R32 Unspecified urinary incontinence: Secondary | ICD-10-CM

## 2021-09-09 DIAGNOSIS — R2689 Other abnormalities of gait and mobility: Secondary | ICD-10-CM

## 2021-09-09 NOTE — Therapy (Signed)
[facility] [address] [place], [place], [address] Phone: [phone]   Fax:  [phone]  Physical Therapy Treatment  Patient Details  Name: Christine Moore MRN: [id number] Date of Birth: [birth year] Referring Provider (PT): Dillard Cannon. Criselda Peaches   Encounter Date: 09/09/2021   PT End of Session - 09/09/21 1250     Visit Number 6    Date for PT Re-Evaluation 11/06/21    Authorization Type UHC medicare    Authorization - Visit Number 6    Authorization - Number of Visits 10    PT Start Time 1229    PT Stop Time 1251    PT Time Calculation (min) 22 min    Activity Tolerance Patient tolerated treatment well    Behavior During Therapy WFL for tasks assessed/performed             Past Medical History:  Diagnosis Date   Atrial fibrillation (HCC)    Atrial flutter (HCC)    COPD (chronic obstructive pulmonary disease) (HCC)     Past Surgical History:  Procedure Laterality Date   ABDOMINAL HYSTERECTOMY     CARDIOVERSION N/A 05/10/2021   Procedure: CARDIOVERSION;  Surgeon: Jake Bathe, MD;  [facility] ENDOSCOPY;  Service: Cardiovascular;  Laterality: N/A;   TEE WITHOUT CARDIOVERSION N/A 05/10/2021   Procedure: TRANSESOPHAGEAL ECHOCARDIOGRAM (TEE);  Surgeon: Jake Bathe, MD;  Location: [facility] ENDOSCOPY;  Service: Cardiovascular;  Laterality: N/A;    There were no vitals filed for this visit.   Subjective Assessment - 09/09/21 1249     Subjective My sister got a very bad diagnosis/prognosis of brain cancer this weekend. I want to learn my new exercises today and leave. I have not done any exercises this weekend.    Pertinent History COPD; Afib; Hypothyroidism; Aflutter    Currently in Pain? No/denies    Multiple Pain Sites No                               [facility] Adult PT Treatment/Exercise - 09/09/21 0001       Lumbar Exercises: Supine   Clam --   green band: gave for HEP 2x6/added to  HEP   Dead Bug 5 reps;5 seconds   Added to HEP   Bridge 5 reps;3 seconds   Added to HEP                    PT Education - 09/09/21 1237     Education Details heP PROGRESSION    Person(s) Educated Patient    Methods Explanation;Tactile cues;Verbal cues;Handout    Comprehension Returned demonstration;Verbalized understanding              PT Short Term Goals - 09/04/21 1206       PT SHORT TERM GOAL #1   Title independent with pelvic floor strength and balance exercises    Time 4    Period Weeks    Status On-going    Target Date 09/11/21      PT SHORT TERM GOAL #2   Title understand how to perform abdominal massage to reduce the amount of Type 1 stool by 50% and understand the importance of fiber and hydration    Time 4    Period Weeks    Status On-going    Target Date 09/11/21      PT SHORT TERM GOAL #3   Title 4  Period Weeks    Status On-going      PT SHORT TERM GOAL #4   Title education on correct toileting technique to fully expel her stool    Time 4    Period Weeks    Status On-going    Target Date 09/11/21               PT Long Term Goals - 08/14/21 1439       PT LONG TERM GOAL #1   Title Independent with advanced pelvic foor, core, and hip strength    Time 12    Period Weeks    Status New    Target Date 11/06/21      PT LONG TERM GOAL #2   Title reduction of nightime voids to 2 times per night due to using behavioral techniques and increased pelvic floor strength    Time 12    Period Weeks    Status New    Target Date 11/06/21      PT LONG TERM GOAL #3   Title bowel movements that are Type 4 are the size of a thumb due to improved relaxation of the pelvic floor muscles as the stool is being expeled    Time 12    Period Weeks    Status New    Target Date 11/06/21      PT LONG TERM GOAL #4   Title reduction of pads to light incontinence pads due to urinary leakage decreased >/= 75%    Time 12    Period Weeks    Status  New    Target Date 11/06/21      PT LONG TERM GOAL #5   Title Patient balance is improved so her fear of falling during her daily tasks has decreased >/= 75%    Time 12    Period Weeks    Status New    Target Date 11/06/21                   Plan - 09/09/21 1251     Clinical Impression Statement Pt requests shortened session today due to her sister receiving a diagnosis of brain cancer over the weekend. Pt cannot focus and is highly anxious. Pt was instructed in her 3 new exercises and left to go be with her sister.    Personal Factors and Comorbidities Comorbidity 3+;Time since onset of injury/illness/exacerbation;Fitness;Age    Comorbidities COPD; Hypothyroidism, Afib;    PT Next Visit Plan Review new exercises.             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   Recurrent pneumonia    Atrial fibrillation (HCC) 05/08/2021    Oluwadarasimi Favor, PTA 09/09/2021, 12:53 PM  [facility] [address] [place], [place], [address] Phone: [phone]   Fax:  [phone]  Name: Christine Moore MRN: [id number] Date of Birth: 02/28/1946  Access Code: [email or web address]: [email or web address]: 10/10/2022Prepared by: Victorino Dike CochranExercises  Supine Transversus Abdominis Bracing - Hands on Stomach -  3 x daily - 7 x weekly - 5 reps   Standing Hip Abduction with Counter Support - 2 x daily - 7 x weekly - 5 reps  Standing Hip Extension with Counter Support - 2 x daily - 7 x weekly - 5 reps  Supine Transversus Abdominis Bracing with Double Leg Fallout - 2 x daily - 7 x weekly - 2 sets - 5 reps  Supine March - 2 x daily - 7 x weekly - 2 sets - 5 reps - 3 hold  Standing Single Leg Stance with Counter Support - 3 x daily - 7 x weekly - 3 reps - 10 hold  Standing Tandem Balance with Counter Support - 3 x daily - 7 x weekly - 2 reps - 30 hold  Clamshell with Resistance - 1 x daily - 7 x weekly - 2 sets - 6 reps  Bridge - 1 x daily - 7 x weekly - 2 sets - 5 reps  Dead Bug - 1 x daily - 7 x weekly - 2 sets - 6 reps - 5 hold

## 2021-09-20 ENCOUNTER — Other Ambulatory Visit: Payer: Self-pay

## 2021-09-20 ENCOUNTER — Ambulatory Visit: Payer: Medicare Other | Admitting: Physical Therapy

## 2021-09-20 ENCOUNTER — Encounter: Payer: Self-pay | Admitting: Physical Therapy

## 2021-09-20 DIAGNOSIS — R32 Unspecified urinary incontinence: Secondary | ICD-10-CM

## 2021-09-20 DIAGNOSIS — R2689 Other abnormalities of gait and mobility: Secondary | ICD-10-CM

## 2021-09-20 DIAGNOSIS — M6281 Muscle weakness (generalized): Secondary | ICD-10-CM

## 2021-09-20 NOTE — Therapy (Signed)
[facility] [address] [place], [place], [address] Phone: [phone]   Fax:  [phone]  Physical Therapy Treatment  Patient Details  Name: Christine Moore MRN: [id number] Date of Birth: Jun 04, 1946 Referring Provider (PT): Dillard Cannon. Criselda Peaches   Encounter Date: 09/20/2021   PT End of Session - 09/20/21 0930     Visit Number 7    Date for PT Re-Evaluation 11/06/21    Authorization Type UHC medicare    Authorization - Visit Number 6    Authorization - Number of Visits 10    PT Start Time [phone]    PT Stop Time 1016    PT Time Calculation (min) 45 min    Activity Tolerance Patient tolerated treatment well    Behavior During Therapy WFL for tasks assessed/performed             Past Medical History:  Diagnosis Date   Atrial fibrillation (HCC)    Atrial flutter (HCC)    COPD (chronic obstructive pulmonary disease) (HCC)     Past Surgical History:  Procedure Laterality Date   ABDOMINAL HYSTERECTOMY     CARDIOVERSION N/A 05/10/2021   Procedure: CARDIOVERSION;  Surgeon: Jake Bathe, MD;  Location: [facility] ENDOS[facility];  Service: Cardiovascular;  Laterality: N/A;   TEE WITHOUT CARDIOVERSION N/A 05/10/2021   Procedure: TRANSESOPHAGEAL ECHOCARDIOGRAM (TEE);  Surgeon: Jake Bathe, MD;  Location[facility] ENDOSCOPY;  Service: Cardiovascular;  Laterality: N/A;    There were no vitals filed for this visit.   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 No significant changes.    Pertinent History COPD; Afib; Hypothyroidism; Aflutter    Patient Stated Goals reduce leakage, better stool, learn exercise                               [facility] Adult PT Treatment/Exercise - 09/20/21 0001       Neuro Re-ed    Neuro Re-ed Details  Tandem stance/stagger step added for HEP (suggested narrowing stance for  advancing HEP): Single limb stance Bil requires either 2 finger support for support or toe touch on opposite foot      Lumbar Exercises: Aerobic   Nustep L1 x 6 min PTA present LE only      Lumbar Exercises: Supine   Bridge 5 reps;3 seconds   Added to HEP     Knee/Hip Exercises: Supine   Other Supine Knee/Hip Exercises Clamshell in hooklying green band 2 x 10                     PT Education - 09/20/21 0936     Education Details Discussed ways to create time for caring for herself while she cares for her sister.  For example, while sister is resting or sleeping, she can run through her exercises. Suggested "squatty potty" for home.  Instructed to narrow stance to progress HEP on tandem stance for more challenging balance tasks.              PT Short Term Goals - 09/20/21 0946       PT SHORT TERM GOAL #1   Title independent with pelvic floor strength and balance exercises    Time 4    Period Weeks    Status Achieved  Target Date 09/11/21      PT SHORT TERM GOAL #2   Title understand how to perform abdominal massage to reduce the amount of Type 1 stool by 50% and understand the importance of fiber and hydration: 75/% improvement in having bowel movement and 10% reduction in incontinence episodes    Time 4    Period Weeks    Status On-going    Target Date 10/12/21      PT SHORT TERM GOAL #4   Title education on correct toileting technique to fully expel her stool: did not recall any techniques upon review    Time 4    Period Weeks    Target Date 10/12/21               PT Long Term Goals - 09/20/21 0956       PT LONG TERM GOAL #1   Title Independent with advanced pelvic foor, core, and hip strength    Time 12    Period Weeks    Status On-going                   Plan - 09/20/21 4540     Clinical Impression Statement Patient was able to complete todays tasks with no increased pain. She continues to struggle with balance tasks but is  able to demonstrate and return verbal understanding of HEP.    Personal Factors and Comorbidities Comorbidity 3+;Time since onset of injury/illness/exacerbation;Fitness;Age    Comorbidities COPD; Hypothyroidism, Afib;    Examination-Activity Limitations Continence;Toileting;Bend;Locomotion Level    Examination-Participation Restrictions Cleaning;Community Activity    Stability/Clinical Decision Making Evolving/Moderate complexity    Rehab Potential Good    PT Frequency 2x / week    PT Treatment/Interventions ADLs/Self Care Home Management;Aquatic Therapy;Biofeedback;Electrical Stimulation;Therapeutic activities;Therapeutic exercise;Balance training;Neuromuscular re-education;Patient/family education;Manual techniques;Passive range of motion;Joint Manipulations    PT Next Visit Plan Review new exercises.    PT Home Exercise Plan Access Code [email or web address]    Consulted and Agree with Plan of Care Patient             Patient will benefit from skilled therapeutic intervention in order to improve the following deficits and impairments:  Decreased coordination, Decreased range of motion, Difficulty walking, Increased fascial restricitons, Increased muscle spasms, Decreased activity tolerance, Decreased mobility, Decreased strength, Decreased balance  Visit Diagnosis: Muscle weakness (generalized)  Urinary incontinence, unspecified type  Other abnormalities of gait and mobility     Problem List Patient Active Problem List   Diagnosis Date Noted   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 [place], [place], [address] Phone: [phone]   Fax:  [phone]  Name: Christine Moore MRN: [id number] Date of Birth: 04-01-1946

## 2021-09-23 ENCOUNTER — Other Ambulatory Visit: Payer: Self-pay

## 2021-09-23 ENCOUNTER — Ambulatory Visit (INDEPENDENT_AMBULATORY_CARE_PROVIDER_SITE_OTHER): Payer: Medicare Other | Admitting: Family Medicine

## 2021-09-23 ENCOUNTER — Encounter: Payer: Self-pay | Admitting: Family Medicine

## 2021-09-23 VITALS — BP 156/98 | HR 81 | Wt 177.1 lb

## 2021-09-23 DIAGNOSIS — R030 Elevated blood-pressure reading, without diagnosis of hypertension: Secondary | ICD-10-CM | POA: Diagnosis not present

## 2021-09-23 DIAGNOSIS — Z0000 Encounter for general adult medical examination without abnormal findings: Secondary | ICD-10-CM | POA: Diagnosis not present

## 2021-09-23 NOTE — Progress Notes (Signed)
   GYNECOLOGY OFFICE VISIT NOTE  History:  75 y.o. G0P0 here today for establishing Gyn care. Recently moved here from [place]. She denies any abnormal vaginal discharge, bleeding, pelvic pain or other concerns.   # History gathered Has COPD and asthma BP high - nervous when she drives to new places Had Mammogram in [place] in 2021.  Would like speculum exam for eval for melanoma  #Family Hx  Hx of melanoma in sister, breast cancer in sister Sister also with peritoneal cancer  #Surgical hx Hysterectomy and ovary removal 1986 (at age 72) No prior pregnancies No vaginal bleeding since hysterectomy  #Hx of incontinence Saw UroGyn in [place] Rectocele and sling (in 07/2020) - seeing pelvic floor PT - going since 05/2021 - symptoms worse with cough  - feels like PT helping - offered UroGyn referral here but at this time wants to hold off until sees PT a couple more times  The following portions of the patient's history were reviewed and updated as appropriate: allergies, current medications, past family history, past medical history, past social history, past surgical history and problem list.   Health Maintenance:  Normal pap and negative HRHPV on .  Normal mammogram in 2021. Will get records   Review of Systems:  Pertinent items noted in HPI Review of Systems  All other systems reviewed and are negative.  Objective:  Physical Exam BP (!) 156/98   Pulse 81   Wt 177 lb 1.6 oz (80.3 kg)   BMI 26.15 kg/m  Physical Exam Vitals and nursing note reviewed. Exam conducted with a chaperone present.  HENT:     Head: Normocephalic.  Cardiovascular:     Rate and Rhythm: Normal rate.     Pulses: Normal pulses.  Pulmonary:     Effort: Pulmonary effort is normal.  Chest:  Breasts:    Right: Normal. No swelling or mass.     Left: Normal. No swelling or mass.  Abdominal:     General: Abdomen is flat. There is no distension.     Tenderness: There is no abdominal  tenderness.  Genitourinary:    General: Normal vulva.     Comments: No evidence of lesions on speculum exam. Bimanual exam without masses. Lymphadenopathy:     Upper Body:     Right upper body: No supraclavicular, axillary or pectoral adenopathy.     Left upper body: No supraclavicular, axillary or pectoral adenopathy.  Neurological:     Mental Status: She is alert.    Assessment & Plan:  1. Well woman exam.  Breast exam without abnormal findings. Speculum exam without evidence of lesions. Bimanual exam without masses.  - Reassurance provided - Patient to get records for mammogram and send [place] - if due for mammogram will order (thinks it was done last year). Given her hx of breast cancer opts for yearly exam  2. Elevated BP BP elevated in office today. Denies symptoms. Reports has not had elevated BP before.  - Plans PCP follow up  Routine preventative health maintenance measures emphasized. Please refer to After Visit Summary for other counseling recommendations.     Total face-to-face time with patient: 30 minutes.  Over 50% of encounter was spent on counseling and coordination of care. Warner Mccreedy, MD, MPH OB Fellow, Faculty [facility] for [facility], [facility]

## 2021-09-23 NOTE — Progress Notes (Signed)
Patient in today for new GYN appointment. Saw Urogyno in [place] where she had sling surgery preformed in August of 2021, see PT for pelvic floor strengthening. States that since surgery she has had many issues. Has issues with bowel movements. Family history of melanoma and brain cancer. Requesting pelvic exam.   Wynona Canes, CMA

## 2021-09-25 ENCOUNTER — Encounter: Payer: Medicare Other | Admitting: Physical Therapy

## 2021-10-18 ENCOUNTER — Encounter: Payer: Self-pay | Admitting: Physical Therapy

## 2021-10-18 ENCOUNTER — Other Ambulatory Visit: Payer: Self-pay

## 2021-10-18 ENCOUNTER — Ambulatory Visit: Payer: Medicare Other | Attending: Internal Medicine | Admitting: Physical Therapy

## 2021-10-18 DIAGNOSIS — R278 Other lack of coordination: Secondary | ICD-10-CM | POA: Diagnosis present

## 2021-10-18 DIAGNOSIS — M6281 Muscle weakness (generalized): Secondary | ICD-10-CM | POA: Diagnosis present

## 2021-10-18 DIAGNOSIS — R2689 Other abnormalities of gait and mobility: Secondary | ICD-10-CM | POA: Insufficient documentation

## 2021-10-18 DIAGNOSIS — R32 Unspecified urinary incontinence: Secondary | ICD-10-CM | POA: Diagnosis present

## 2021-10-18 NOTE — Patient Instructions (Addendum)
Moisturizers They are used in the vagina to hydrate the mucous membrane that make up the vaginal canal. Designed to keep a more normal acid balance (ph) Once placed in the vagina, it will last between two to three days.  Use 2-3 times per week at bedtime  Ingredients to avoid is glycerin and fragrance, can increase chance of infection Should not be used just before sex due to causing irritation Most are gels administered either in a tampon-shaped applicator or as a vaginal suppository. They are non-hormonal.   Types of Moisturizers(internal use)  Vitamin E vaginal suppositories- Whole foods, Amazon Moist Again Coconut oil- can break down condoms Julva- (Do no use if on Tamoxifen) amazon Yes moisturizer- amazon NeuEve Silk , NeuEve Silver for menopausal or over 65 (if have severe vaginal atrophy or cancer treatments use NeuEve Silk for  1 month than move to [employer])- [employer], [place].com Olive and Bee intimate cream- www.oliveandbee.com.au Mae vaginal moisturizer- Amazon Aloe Olive oil    Creams to use externally on the Vulva area Marathon Oil (good for for cancer patients that had radiation to the area)- [place] or [employer].[email or web address]://garcia-[email or web address]/ V-magic cream - amazon Julva-amazon Vital "V Wild Yam salve ( help moisturize and help with thinning vulvar area, does have Beeswax MoodMaid Botanical Pro-Meno Wild Yam Cream- Amazon Desert Harvest Gele Cleo by Zane Herald labial moisturizer (Amazon,  Coconut or olive oil aloe   Things to avoid in the vaginal area Do not use things to irritate the vulvar area No lotions just specialized creams for the vulva area- Neogyn, V-magic, No soaps; can use Aveeno, cetaphil, cera ve or Calendula cleanser if needed. Must be gentle No deodorants No douches Good to sleep without underwear to let the vaginal area to air out No scrubbing: spread the lips to let warm water rinse over labias and pat dry  Lay on bed with towel under you for 30  minutes to air out the vaginal area.   Urge Incontinence  Ideal urination frequency is every 2-4 wakeful hours, which equates to 5-8 times within a 24-hour period.   Urge incontinence is leakage that occurs when the bladder muscle contracts, creating a sudden need to go before getting to the bathroom.   Going too often when your bladder isn't actually full can disrupt the body's automatic signals to store and hold urine longer, which will increase urgency/frequency.  In this case, the bladder "is running the show" and strategies can be learned to retrain this pattern.   One should be able to control the first urge to urinate, at around .  The bladder can hold up to a "grande latte," or . To help you gain control, practice the Urge Drill below when urgency strikes.  This drill will help retrain your bladder signals and allow you to store and hold urine longer.  The overall goal is to stretch out your time between voids to reach a more manageable voiding schedule.    Practice your "quick flicks" often throughout the day (each waking hour) even when you don't need feel the urge to go.  This will help strengthen your pelvic floor muscles, making them more effective in controlling leakage.  Urge Drill  When you feel an urge to go, follow these steps to regain control: Stop what you are doing and be still Take one deep breath, directing your air into your abdomen Think an affirming thought, such as "I've got this." Do 5 quick flicks of your pelvic floor   [facility] [address] [place], [place] [address] Phone # [phone] Fax [phone]   Walk with control to the bathroom to void, or delay voiding  Access Code: [facility] URL: [email or web address] Date: 10/18/2021 Prepared by: Eulis Foster  Exercises Supine Transversus Abdominis Bracing - Hands on Stomach - 3 x daily - 2 x weekly - 5 reps Standing Hip Abduction with  Counter Support - 2 x daily - 2 x weekly - 5 reps Standing Hip Extension with Counter Support - 2 x daily - 2 x weekly - 5 reps Standing Single Leg Stance with Counter Support - 3 x daily - 2 x weekly - 3 reps - 10 hold Standing Tandem Balance with Counter Support - 3 x daily - 2 x weekly - 2 reps - 30 hold Clamshell with Resistance - 1 x daily - 2 x weekly - 2 sets - 6 reps Bridge - 1 x daily - 2 x weekly - 2 sets - 5 reps Dead Bug - 1 x daily - 2 x weekly - 2 sets - 6 reps - 5 hold

## 2021-10-18 NOTE — Therapy (Signed)
[facility] [address] [place], [place], [address] Phone: [phone]   Fax:  [phone]  Physical Therapy Treatment  Patient Details  Name: Christine Moore MRN: [id number] Date of Birth: August 01, 1946 Referring Provider (PT): Peri Jefferson. Daryll Drown   Encounter Date: 10/18/2021   PT End of Session - 10/18/21 1057     Visit Number 8    Date for PT Re-Evaluation 11/06/21    Authorization Type [employer] medicare    Authorization - Visit Number 7    Authorization - Number of Visits 10    PT Start Time 3016    PT Stop Time 1053    PT Time Calculation (min) 38 min    Activity Tolerance Patient tolerated treatment well    Behavior During Therapy WFL for tasks assessed/performed             Past Medical History:  Diagnosis Date   Atrial fibrillation ([place])    Atrial flutter ([facility])    COPD (chronic obstructive pulmonary disease) ([place])     Past Surgical History:  Procedure Laterality Date   ABDOMINAL HYSTERECTOMY     CARDIOVERSION N/A 05/10/2021   Procedure: CARDIOVERSION;  Surgeon: Jerline Pain, MD;  Location: [facility];  Service: Cardiovascular;  Laterality: N/A;   OOPHORECTOMY Bilateral    TEE WITHOUT CARDIOVERSION N/A 05/10/2021   Procedure: TRANSESOPHAGEAL ECHOCARDIOGRAM (TEE);  Surgeon: Jerline Pain, MD;  Location: [facility] ENDOSCOPY;  Service: Cardiovascular;  Laterality: N/A;    There were no vitals filed for this visit.   Subjective Assessment - 10/18/21 1019     Subjective My sisters cancer came back and she had surgery. I am having trouble taking care of my sister and no able to focus on the program at this time. I am taking her to her appointments and taking care of her.    Pertinent History COPD; Afib; Hypothyroidism; Aflutter    Patient Stated Goals reduce leakage, better stool, learn exercise    Currently in Pain? No/denies                [facility] PT Assessment - 10/18/21 0001       Assessment    Medical Diagnosis R2681; M62.81 Muscle weakness and generalized weakness    Referring Provider (PT) Raquel Sarna B. Mullen    Prior Therapy none      Precautions   Precautions None      Restrictions   Weight Bearing Restrictions No      Prior Function   Level of Independence Independent      Cognition   Overall Cognitive Status Within Functional Limits for tasks assessed      Posture/Postural Control   Posture/Postural Control Postural limitations    Postural Limitations Flexed trunk      AROM   Lumbar Extension decreased by 25%      PROM   Right Hip External Rotation  70    Left Hip Flexion 120    Left Hip External Rotation  70      Strength   Right Hip Extension 5/5    Right Hip External Rotation  5/5    Right Hip Internal Rotation 5/5    Right Hip ABduction 3+/5    Left Hip Extension 5/5    Left Hip ABduction 3+/5                        Pelvic Floor Special Questions - 10/18/21 0001  Currently Sexually Active No    Urinary Leakage Yes    Pad use changes pads 2 times per day for the maximum abdorbancy; wears depends for maximum abdorbancy 2 pads    Activities that cause leaking With strong urge;Coughing;Sneezing;Laughing;Bending;Other    Other activities that cause leaking getting up and down from a chair,    Urinary urgency Yes   unable to wait   Urinary frequency urinate 8x AM between 6-12; afternoon urinates 2 hours; night time gets up every 2 hours    Fecal incontinence No   strain to have a bowel movement; 4 small BM; most BM are Type 1 and sometimes Type4   Caffeine beverages nons    Strength weak squeeze, no lift               [facility] Adult PT Treatment/Exercise - 10/18/21 0001       Self-Care   Self-Care Other Self-Care Comments    Other Self-Care Comments  educated patient on vaginal moisturizers and how they help with the tissue and showed her on the pelvic floor model what tissues become dry. educated patient on vaginal health and how  to properly take care of the tissue; educated patient on the urge to void      Exercises   Exercises Other Exercises    Other Exercises  verbally reviewed HEP and changed the frequency to make it easier to fit in her schedule and not be overwhelming                     PT Education - 10/18/21 1051     Education Details Access Code: [email or web address]; education to do the urge to void ;educated patient on vaginal moisturizers    Person(s) Educated Patient    Methods Explanation;Demonstration;Handout    Comprehension Verbalized understanding;Returned demonstration              PT Short Term Goals - 10/18/21 1100       PT SHORT TERM GOAL #1   Title independent with pelvic floor strength and balance exercises    Time 4    Period Weeks    Status Achieved      PT SHORT TERM GOAL #2   Title understand how to perform abdominal massage to reduce the amount of Type 1 stool by 50% and understand the importance of fiber and hydration: 75/% improvement in having bowel movement and 10% reduction in incontinence episodes    Time 4    Period Weeks    Status Not Met      PT SHORT TERM GOAL #4   Title education on correct toileting technique to fully expel her stool: did not recall any techniques upon review    Time 4    Period Weeks    Status Not Met               PT Long Term Goals - 10/18/21 1101       PT LONG TERM GOAL #1   Title Independent with advanced pelvic foor, core, and hip strength    Time 12    Period Weeks    Status Achieved      PT LONG TERM GOAL #2   Title reduction of nightime voids to 2 times per night due to using behavioral techniques and increased pelvic floor strength    Time 12    Period Weeks    Status Not Met      PT LONG TERM GOAL #3  Title bowel movements that are Type 4 are the size of a thumb due to improved relaxation of the pelvic floor muscles as the stool is being expeled    Time 12    Period Weeks    Status Achieved      PT LONG  TERM GOAL #4   Title reduction of pads to light incontinence pads due to urinary leakage decreased >/= 75%    Time 12    Period Weeks    Status Not Met      PT LONG TERM GOAL #5   Title Patient balance is improved so her fear of falling during her daily tasks has decreased >/= 75%    Time 12    Period Weeks    Status Not Met                   Plan - 10/18/21 1057     Clinical Impression Statement Patient is taking care of her sister who has brain cancer and taking her to her appointments. Patient is having difficutly with doing her HEP at this time. Patient reports since she is not able to focus on the program she has not made progress. Therapist and patient agreed for her to be discharged at this time and resume therapy when she is able to focus on the program and when her sister does not need her as much. Patient has increased in hip strength. She continues to wear pads and leak urine. She has occasional fecal leakage. Patient will be discharged at this time.    Personal Factors and Comorbidities Comorbidity 3+;Time since onset of injury/illness/exacerbation;Fitness;Age    Comorbidities COPD; Hypothyroidism, Afib;    Examination-Activity Limitations Continence;Toileting;Bend;Locomotion Level    Stability/Clinical Decision Making Evolving/Moderate complexity    Rehab Potential Good    PT Treatment/Interventions ADLs/Self Care Home Management;Aquatic Therapy;Biofeedback;Electrical Stimulation;Therapeutic activities;Therapeutic exercise;Balance training;Neuromuscular re-education;Patient/family education;Manual techniques;Passive range of motion;Joint Manipulations    PT Next Visit Plan Discharge to [facility] and Agree with Plan of Care Patient             Patient will benefit from skilled therapeutic intervention in order to improve the following deficits and impairments:  Decreased coordination, Decreased range of motion,  Difficulty walking, Increased fascial restricitons, Increased muscle spasms, Decreased activity tolerance, Decreased mobility, Decreased strength, Decreased balance  Visit Diagnosis: Muscle weakness (generalized)  Urinary incontinence, unspecified type  Other abnormalities of gait and mobility  Other lack of coordination     Problem List Patient Active Problem List   Diagnosis Date Noted   Elevated blood pressure reading 09/23/2021   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    Remaining deficits: See above.    Education / Equipment: HEP   Patient agrees to discharge. Patient goals were not met. Patient is being discharged due to  patient taking care of her sister who has brain cancer and not able to participate in her present program.  Earlie Counts, PT 10/18/21  12:22 PM

## 2021-10-21 ENCOUNTER — Encounter: Payer: Medicare Other | Admitting: Physical Therapy

## 2021-11-08 ENCOUNTER — Other Ambulatory Visit: Payer: Self-pay | Admitting: Pulmonary Disease

## 2021-11-08 ENCOUNTER — Encounter: Payer: Self-pay | Admitting: Pulmonary Disease

## 2021-11-08 ENCOUNTER — Ambulatory Visit (INDEPENDENT_AMBULATORY_CARE_PROVIDER_SITE_OTHER): Payer: Medicare Other | Admitting: Pulmonary Disease

## 2021-11-08 DIAGNOSIS — J441 Chronic obstructive pulmonary disease with (acute) exacerbation: Secondary | ICD-10-CM | POA: Diagnosis not present

## 2021-11-08 DIAGNOSIS — J45901 Unspecified asthma with (acute) exacerbation: Secondary | ICD-10-CM

## 2021-11-08 MED ORDER — PREDNISONE 10 MG PO TABS: 40.0000 mg | ORAL_TABLET | Freq: Every day | ORAL | 0 refills | Status: AC

## 2021-11-08 MED ORDER — CEFDINIR 300 MG PO CAPS: 300.0000 mg | ORAL_CAPSULE | Freq: Two times a day (BID) | ORAL | 0 refills | Status: DC

## 2021-11-08 NOTE — Progress Notes (Signed)
Virtual Visit via Telephone Note  I connected with [name] on 11/08/21 at  4:30 PM EST by telephone and verified that I am speaking with the correct person using two identifiers.  Location: Patient: Home Provider: [facility]   I discussed the limitations, risks, security and privacy concerns of performing an evaluation and management service by telephone and the availability of in person appointments. I also discussed with the patient that there may be a patient responsible charge related to this service. The patient expressed understanding and agreed to proceed.   I discussed the assessment and treatment plan with the patient. The patient was provided an opportunity to ask questions and all were answered. The patient agreed with the plan and demonstrated an understanding of the instructions.   The patient was advised to call back or seek an in-person evaluation if the symptoms worsen or if the condition fails to improve as anticipated.  I provided 25 minutes of non-face-to-face time during this encounter.   Fawzi Melman Rodman Pickle, MD   Subjective:   PATIENT ID: Christine Moore GENDER: female DOB: [birth year], MRN: [id number]   HPI  Chief Complaint  Patient presents with   Acute Visit    Worsening cough   Reason for Visit: Follow-up COPD/asthma  [name] is a 75 year old female with asthma, atrial fibrillation s/p ablations and cardioversions, HTN, HLD and chronic headaches who presents as new consult for asthma/COPD management.  Synopsis: 2022 Aug - Established care with [facility]. She was diagnosed with asthma as a teen and COPD in 2019. She recently had pneumonia and completed antibiotics in July. She is on Trelegy, xopenex and Singulair. She reports that she has had pneumonia four times in the last 12 months. She states persistent productive cough that previously interrupted her ability to speak and be in public. Since discharge, she has been  taking mucinex twice a day with significant improvement.  Unclear if this is due to medications or due to moving to [place].  No longer having any limitations in activity.   11/08/21 In the last week, she reports worsening cough at night interrupting her sleep. Her cough is productive with brown. Associated wheezing.  Denies fevers, chills. Compliant with Trelegy. She has used rescue inhaler nightly.  Social History: Former smoker. Smoked 1 ppd x 10 years. Quit in 1985. Retired Pharmacist, hospital Significant second smoke exposure Mom had asthma Moving in with her sister  Past Medical History:  Diagnosis Date   Atrial fibrillation ([place])    Atrial flutter ([facility])    COPD (chronic obstructive pulmonary disease) ([place])      Family History  Problem Relation Age of Onset   Melanoma Sister    Breast cancer Sister    Endometrial cancer Sister      Social History   Occupational History   Not on file  Tobacco Use   Smoking status: Former    Packs/day: 1.00    Years: 22.00    Pack years: 22.00    Types: Cigarettes    Start date: 1963    Quit date: 1985    Years since quitting: 37.9   Smokeless tobacco: Never  Substance and Sexual Activity   Alcohol use: Yes    Comment: drinks 1 drink per day of all the above   Drug use: Never   Sexual activity: Not on file    Allergies  Allergen Reactions   Penicillins Swelling    Unable to recall  Swelling not  throat/face    Quinapril Swelling    Swelling of face Swelling of face    Latex Itching   Formaldehyde Itching   Other Rash     Outpatient Medications Prior to Visit  Medication Sig Dispense Refill   acyclovir (ZOVIRAX) 400 MG tablet Take 400 mg by mouth 3 (three) times daily as needed.     apixaban (ELIQUIS) 5 MG TABS tablet Take 1 tablet (5 mg total) by mouth 2 (two) times daily. 60 tablet 6   atorvastatin (LIPITOR) 10 MG tablet Take 10 mg by mouth at bedtime.     Calcium Polycarbophil (FIBER-CAPS PO) Take 1 capsule by mouth  daily.     chlorpheniramine (CHLOR-TRIMETON) 4 MG tablet Take 4 mg by mouth 2 (two) times daily as needed for allergies.     Cholecalciferol 10 MCG (400 UNIT) CAPS Take 800 Units by mouth in the morning and at bedtime.     clobetasol ointment (TEMOVATE) 0.05 % Apply 1 application topically daily as needed.     diltiazem (TIAZAC) 240 MG 24 hr capsule Take 1 capsule (240 mg total) by mouth daily. 90 capsule 2   Fluticasone-Umeclidin-Vilant (TRELEGY ELLIPTA) 200-62.5-25 MCG/INH AEPB Inhale 1 puff into the lungs daily.     Ketotifen Fumarate (ALAWAY OP) Place 1 drop into both eyes daily as needed (allergies). (Patient not taking: Reported on 09/23/2021)     levalbuterol (XOPENEX HFA) 45 MCG/ACT inhaler Inhale 1-2 puffs into the lungs every 4 (four) hours as needed for shortness of breath or wheezing.     levothyroxine (SYNTHROID) 50 MCG tablet Take 50 mcg by mouth daily.     losartan (COZAAR) 50 MG tablet Take 1 tablet (50 mg total) by mouth daily. 90 tablet 2   Lutein-Zeaxanthin 25-5 MG CAPS Take 1 capsule by mouth at bedtime.     montelukast (SINGULAIR) 10 MG tablet Take 10 mg by mouth at bedtime.     pantoprazole (PROTONIX) 40 MG tablet Take 40 mg by mouth daily. (Patient not taking: Reported on 09/23/2021)     azithromycin (ZITHROMAX) 250 MG tablet Take two tabs on Day 1. Take one tab daily from Day 2 to 5. 6 tablet 0   cefdinir (OMNICEF) 300 MG capsule Take 1 capsule (300 mg total) by mouth 2 (two) times daily. 14 capsule 0   No facility-administered medications prior to visit.    Review of Systems  Constitutional:  Negative for chills, diaphoresis, fever, malaise/fatigue and weight loss.  HENT:  Negative for congestion.   Respiratory:  Positive for cough, sputum production, shortness of breath and wheezing. Negative for hemoptysis.   Cardiovascular:  Negative for chest pain, palpitations and leg swelling.    Objective:   There were no vitals filed for this visit.     Physical  Exam: General: No apparent distress Respiratory: No respiratory distress or audible wheezing. Able to complete full sentences  Data Reviewed:  Imaging: CTA 05/08/21 - No pulmonary embolus, patchy bilateral lower lobe airspace consolidation left> right, 4 mm right upper lobe perifissural nodule  PFT: None on file  Labs: Alpha 1-antitrypsin MZ - 91 (LLN 101) Immunoglobulins G/A/M - normal range Eosinophil 05/22/21 - 0    Assessment & Plan:   Discussion: 75 year old female former smoker with COPD-asthma overlap and recurrent pneumonias in the last 12 month. We have previously reviewed her chest imaging which demonstrates dense left lower lobe consolidation.  It is possible that this area poses a high risk for recurrent pneumonia however  unable to visualize that her parenchyma to determine if there is any focal bronchiectasis or any other abnormality on available imaging. For now I recommend conservative management allowing her lungs to recover and heal.  When she does develop respiratory symptoms concerning for pneumonia, she may need a longer course of antibiotics for 10 to 14 days.  11/08/21 Symptoms consistent with exacerbation. May be related to pneumonia. Will start with steroids and add antibiotics if symptoms are persistent.  COPD-asthma overlap  - In exacerbation --START prednisone 40 mg x 5 days --If symptoms not improving after 48 hours, start cefdinir twice daily x 14 days  --CONTINUE Trelegy 200-62.5-25 mcg ONE puff ONCE a day. Call for refills --CONTINUE singulair 10 mg daily  Recurrent pneumonia Dense consolidation in LLL. Likely area high risk for recurrent pneumonia --No indication for repeat CT since clinically doing well --In the future if you develop s/sx concerning for pneumonia, will need two week course of antibiotics  **If needs CT need in the future, she has used alprazolam 1-2 mg PRN for anxiety**  Health Maintenance There is no immunization history for the  selected administration types on file for this patient.  CT Lung Screen - discuss for CT  No orders of the defined types were placed in this encounter. Meds ordered this encounter  Medications   predniSONE (DELTASONE) 10 MG tablet    Sig: Take 4 tablets (40 mg total) by mouth daily with breakfast for 5 days.    Dispense:  20 tablet    Refill:  0   cefdinir (OMNICEF) 300 MG capsule    Sig: Take 1 capsule (300 mg total) by mouth 2 (two) times daily.    Dispense:  14 capsule    Refill:  0     No follow-ups on file.  I have spent a total time of 25-minutes on the day of the appointment reviewing prior documentation, coordinating care and discussing medical diagnosis and plan with the patient/family. Past medical history, allergies, medications were reviewed. Pertinent imaging, labs and tests included in this note have been reviewed and interpreted independently by me.  Faith, MD [facility] 11/08/2021 4:40 PM  Office Number [phone]

## 2021-11-08 NOTE — Telephone Encounter (Signed)
Tyasia, please schedule for video visit.

## 2021-11-08 NOTE — Telephone Encounter (Signed)
Telephone visit scheduled. Will close this encounter.

## 2021-11-08 NOTE — Telephone Encounter (Signed)
JE please advise on mychart message.  thanks

## 2021-12-20 ENCOUNTER — Encounter: Payer: Self-pay | Admitting: Pulmonary Disease

## 2021-12-20 MED ORDER — BENZONATATE 200 MG PO CAPS: 200.0000 mg | ORAL_CAPSULE | Freq: Three times a day (TID) | ORAL | 0 refills | Status: DC | PRN

## 2021-12-20 NOTE — Telephone Encounter (Signed)
Called and spoke with patient who states that she started feeling bad on Tuesday this week. She states that the cough she normally has has (since December) gotten so much worse. Productive cough with yellow/brown sputum. She states that she cough's so hard that it's irritating the muscles in her chest, body and throat and it's causing pain.  In addition she is having a general feeling of malaise, very tired, but hard to sleep, headache.  She thought for a while that she might have RSV, also did a covid test and it was negative but the highest temperature has been is 99.1 and that was this morning. Cough never went away after the antibiotics from last December, but it was greatly improved. Sometimes when I lie down at night, my chest feels so full that I am surprised that I can actually take a breath but also worries me.  I have scheduled patient for OV with Dr. [name] on Tuesday 1/24. But states that she would still like message sent because she gets worried at night. She is now using [facility] in [place] [phone]   Please advise

## 2021-12-20 NOTE — Telephone Encounter (Signed)
If acutely ill again this week with fever and increased cough could be due to the flu or even RSV.  As her home COVID test was negative. Would definitely keep office visit with Dr. [name] for next week. In the meantime would use Delsym 2 teaspoons twice daily for cough as needed Can call in Tessalon Perles 3 times daily as needed for cough #30 with no refills. Continue with office visit recommendations from last visit  Fluid rest and Tylenol as needed  If symptoms or not improving will need to go to the emergency room or urgent care  Please contact office for sooner follow up if symptoms do not improve or worsen or seek emergency care

## 2021-12-20 NOTE — Telephone Encounter (Signed)
Called and spoke with patient to go over recs from [employer]. Patient expressed understanding and confirmed pharmacy. RX has been sent in and advised her to keep appt for next week. Nothing further needed at this time.

## 2021-12-24 ENCOUNTER — Ambulatory Visit (INDEPENDENT_AMBULATORY_CARE_PROVIDER_SITE_OTHER): Payer: Medicare Other

## 2021-12-24 ENCOUNTER — Other Ambulatory Visit: Payer: Self-pay

## 2021-12-24 ENCOUNTER — Ambulatory Visit: Payer: Medicare Other | Admitting: Pulmonary Disease

## 2021-12-24 ENCOUNTER — Encounter: Payer: Self-pay | Admitting: Pulmonary Disease

## 2021-12-24 VITALS — BP 154/68 | HR 72 | Temp 98.2°F | Ht 69.0 in | Wt 175.8 lb

## 2021-12-24 DIAGNOSIS — J441 Chronic obstructive pulmonary disease with (acute) exacerbation: Secondary | ICD-10-CM

## 2021-12-24 DIAGNOSIS — J189 Pneumonia, unspecified organism: Secondary | ICD-10-CM

## 2021-12-24 DIAGNOSIS — J45901 Unspecified asthma with (acute) exacerbation: Secondary | ICD-10-CM | POA: Diagnosis not present

## 2021-12-24 DIAGNOSIS — R0989 Other specified symptoms and signs involving the circulatory and respiratory systems: Secondary | ICD-10-CM | POA: Insufficient documentation

## 2021-12-24 MED ORDER — PREDNISONE 10 MG PO TABS: 20.0000 mg | ORAL_TABLET | Freq: Every day | ORAL | 0 refills | Status: AC

## 2021-12-24 MED ORDER — LEVALBUTEROL HCL 0.63 MG/3ML IN NEBU: 0.6300 mg | INHALATION_SOLUTION | RESPIRATORY_TRACT | 12 refills | Status: DC | PRN

## 2021-12-24 MED ORDER — AZITHROMYCIN 250 MG PO TABS: ORAL_TABLET | ORAL | 0 refills | Status: DC

## 2021-12-24 NOTE — Patient Instructions (Addendum)
COPD-asthma overlap exacerbation --ORDER flutter valve --ORDER nebulizer supplies --ORDER sputum cup for respiratory culture --START prednisone 20 mg x 5 days --START Azithromycin --START Xopenex nebulizer twice a day followed by flutter valve --CONTINUE Trelegy 200-62.5-25 mcg ONE puff ONCE a day. Call for refills --CONTINUE singulair 10 mg daily --START mucinex for loose phlegm as needed  Recurrent pneumonia Dense consolidation in LLL. Likely area high risk for recurrent pneumonia --ORDER CXR today --In the future if you develop s/sx concerning for pneumonia, will need two week course of antibiotics --Will consider bronchoscopy if symptoms persistent  Follow-up with me in 6 weeks

## 2021-12-24 NOTE — Progress Notes (Signed)
Subjective:   PATIENT ID: Christine LimSuzanne S Moore GENDER: female DOB: October 20, 1946, MRN: [id number]   HPI  Chief Complaint  Patient presents with   Follow-up    Cough getting worse   Reason for Visit: Follow-up COPD/asthma  Ms. Christine PeerSuzanne Knaggs is a 76 year old female with asthma, atrial fibrillation s/p ablations and cardioversions, HTN, HLD and chronic headaches who presents as new consult for asthma/COPD management.  Synopsis: 2022 Aug - Established care with  Pul[facility]. She was diagnosed with asthma as a teen and COPD in 2019. She recently had pneumonia and completed antibiotics in July. She is on Trelegy, xopenex and Singulair. She reports that she has had pneumonia four times in the last 12 months. She states persistent productive cough that previously interrupted her ability to speak and be in public. Since discharge, she has been taking mucinex twice a day with significant improvement.  Unclear if this is due to medications or due to moving to [place].  No longer having any limitations in activity.   11/08/21 In the last week, she reports worsening cough at night interrupting her sleep. Her cough is productive with brown. Associated wheezing.  Denies fevers, chills. Compliant with Trelegy. She has used rescue inhaler nightly.  12/24/21 On prior telephone visit on 11/08/2021 she was started on prednisone for COPD exacerbation.  She improved on treatment however symptoms did not completely resolve.  On 12/20/2021 she called into the office for productive cough that has worsened in the last week, headache, chest congestion and fatigue.  Reports home COVID test was negative.  NP Parrett called and cough syrup and Tessalon Perles until she can be seen in clinic today. Cough has somewhat improved and persisted. Reports some nasal congestion. Cough one night improved with position but did not improve with reflux. No change with chest congestion/sputum production.  Social History: Former smoker.  Smoked 1 ppd x 10 years. Quit in 1985. Retired Runner, broadcasting/film/videoteacher Significant second smoke exposure Mom had asthma Moving in with her sister  Past Medical History:  Diagnosis Date   Atrial fibrillation (HCC)    Atrial flutter (HCC)    COPD (chronic obstructive pulmonary disease) (HCC)      Family History  Problem Relation Age of Onset   Melanoma Sister    Breast cancer Sister    Endometrial cancer Sister      Social History   Occupational History   Not on file  Tobacco Use   Smoking status: Former    Packs/day: 1.00    Years: 22.00    Pack years: 22.00    Types: Cigarettes    Start date: 1963    Quit date: 1985    Years since quitting: 38.0   Smokeless tobacco: Never  Substance and Sexual Activity   Alcohol use: Yes    Comment: drinks 1 drink per day of all the above   Drug use: Never   Sexual activity: Not on file    Allergies  Allergen Reactions   Penicillins Swelling    Unable to recall  Swelling not throat/face    Quinapril Swelling    Swelling of face Swelling of face    Latex Itching   Formaldehyde Itching   Other Rash     Outpatient Medications Prior to Visit  Medication Sig Dispense Refill   acyclovir (ZOVIRAX) 400 MG tablet Take 400 mg by mouth 3 (three) times daily as needed.     apixaban (ELIQUIS) 5 MG TABS tablet Take 1 tablet (5 mg  total) by mouth 2 (two) times daily. 60 tablet 6   atorvastatin (LIPITOR) 10 MG tablet Take 10 mg by mouth at bedtime.     benzonatate (TESSALON) 200 MG capsule Take 1 capsule (200 mg total) by mouth 3 (three) times daily as needed for cough. 30 capsule 0   Calcium Polycarbophil (FIBER-CAPS PO) Take 1 capsule by mouth daily.     cefdinir (OMNICEF) 300 MG capsule Take 1 capsule (300 mg total) by mouth 2 (two) times daily. 14 capsule 0   chlorpheniramine (CHLOR-TRIMETON) 4 MG tablet Take 4 mg by mouth 2 (two) times daily as needed for allergies.     Cholecalciferol 10 MCG (400 UNIT) CAPS Take 800 Units by mouth in the  morning and at bedtime.     clobetasol ointment (TEMOVATE) 0.05 % Apply 1 application topically daily as needed.     diltiazem (TIAZAC) 240 MG 24 hr capsule Take 1 capsule (240 mg total) by mouth daily. 90 capsule 2   Fluticasone-Umeclidin-Vilant (TRELEGY ELLIPTA) 200-62.5-25 MCG/INH AEPB Inhale 1 puff into the lungs daily.     Ketotifen Fumarate (ALAWAY OP) Place 1 drop into both eyes daily as needed (allergies). (Patient not taking: Reported on 09/23/2021)     levalbuterol (XOPENEX HFA) 45 MCG/ACT inhaler Inhale 1-2 puffs into the lungs every 4 (four) hours as needed for shortness of breath or wheezing.     levothyroxine (SYNTHROID) 50 MCG tablet Take 50 mcg by mouth daily.     losartan (COZAAR) 50 MG tablet Take 1 tablet (50 mg total) by mouth daily. 90 tablet 2   Lutein-Zeaxanthin 25-5 MG CAPS Take 1 capsule by mouth at bedtime.     montelukast (SINGULAIR) 10 MG tablet Take 10 mg by mouth at bedtime.     pantoprazole (PROTONIX) 40 MG tablet Take 40 mg by mouth daily. (Patient not taking: Reported on 09/23/2021)     No facility-administered medications prior to visit.    Review of Systems  Constitutional:  Positive for malaise/fatigue. Negative for chills, diaphoresis, fever and weight loss.  HENT:  Positive for congestion.   Respiratory:  Positive for cough, sputum production and shortness of breath. Negative for hemoptysis and wheezing.   Cardiovascular:  Negative for chest pain, palpitations and leg swelling.  Neurological:  Positive for headaches.    Objective:   Vitals:   12/24/21 0944  BP: (!) 154/68  Pulse: 72  Temp: 98.2 F (36.8 C)  TempSrc: Oral  SpO2: 95%  Weight: 175 lb 12.8 oz (79.7 kg)  Height: 5\' 9"  (1.753 m)    Physical Exam: General: Well-appearing, no acute distress HENT: [place], AT Eyes: EOMI, no scleral icterus Respiratory: Clear to auscultation bilaterally.  No crackles, wheezing or rales Cardiovascular: RRR, -M/R/G, no  JVD Extremities:-Edema,-tenderness Neuro: AAO x4, CNII-XII grossly intact Psych: Normal mood, normal affect  Data Reviewed:  Imaging: CTA 05/08/21 - No pulmonary embolus, patchy bilateral lower lobe airspace consolidation left> right, 4 mm right upper lobe perifissural nodule  PFT: None on file  Labs: Alpha 1-antitrypsin MZ - 91 (LLN 101) Immunoglobulins G/A/M - normal range Eosinophil 05/22/21 - 0    Assessment & Plan:   Discussion: 76 year old female former smoker with COPD-asthma overlap and recurrent pneumonias in the last 12 months. Prior chest imagine which demonstrated left lower lobe consolidation. It is possible that this area poses a high risk for recurrent pneumonia however unable to visualize her parenchyma to determine if there is any focal bronchiectasis or any other abnormality  on available imaging. For now I recommend conservative management allowing her lungs to recover and heal.  When she does develop respiratory symptoms concerning for pneumonia, she may need a longer course of antibiotics for 10 to 14 days.  11/08/21 Symptoms consistent with exacerbation. May be related to pneumonia. Will start with steroids and add antibiotics if symptoms are persistent. 1/20 Worsening symptoms. Given cough meds 12/24/20 Treat for exacerbation. If symptoms persistent, will need to consider bronch  COPD-asthma overlap exacerbation --ORDER flutter valve --ORDER nebulizer supplies --ORDER sputum cup for respiratory culture --START prednisone 20 mg x 5 days --START Azithromycin --START Xopenex nebulizer twice a day followed by flutter valve --CONTINUE Trelegy 200-62.5-25 mcg ONE puff ONCE a day. Call for refills --CONTINUE singulair 10 mg daily --START mucinex for loose phlegm as needed  Recurrent pneumonia Dense consolidation in LLL. Likely area high risk for recurrent pneumonia --ORDER CXR today. ADDENDUM: New right perihilar pneumonia. Treat with abx as above --In the future if  you develop s/sx concerning for pneumonia, will need two week course of antibiotics --Will consider bronchoscopy if symptoms persistent  **If needs CT need in the future, she has used alprazolam 1-2 mg PRN for anxiety**  Health Maintenance Immunization History  Administered Date(s) Administered   Moderna Sars-Covid-2 Vaccination 12/20/2019, 01/17/2020, 10/08/2020   CT Lung Screen - discuss for CT  Orders Placed This Encounter  Procedures   Respiratory or Resp and Sputum Culture    Standing Status:   Future    Standing Expiration Date:   12/24/2022   DG Chest 2 View    Standing Status:   Future    Number of Occurrences:   1    Standing Expiration Date:   12/24/2022    Order Specific Question:   Reason for Exam (SYMPTOM  OR DIAGNOSIS REQUIRED)    Answer:   recurrent pneumonia    Order Specific Question:   Preferred imaging location?    Answer:   Internal   Ambulatory Referral for DME    Referral Priority:   Routine    Referral Type:   Durable Medical Equipment Purchase    Number of Visits Requested:   1   Flutter valve    Standing Status:   Future    Standing Expiration Date:   12/24/2022   Meds ordered this encounter  Medications   levalbuterol (XOPENEX) 0.63 MG/3ML nebulizer solution    Sig: Take 3 mLs (0.63 mg total) by nebulization every 4 (four) hours as needed for wheezing or shortness of breath.    Dispense:  3 mL    Refill:  12    Prefers xopenex due to tachycardia   predniSONE (DELTASONE) 10 MG tablet    Sig: Take 2 tablets (20 mg total) by mouth daily with breakfast for 5 days.    Dispense:  10 tablet    Refill:  0   azithromycin (ZITHROMAX) 250 MG tablet    Sig: Take as directed on package    Dispense:  6 tablet    Refill:  0   No follow-ups on file.  I have spent a total time of 30-minutes on the day of the appointment reviewing prior documentation, coordinating care and discussing medical diagnosis and plan with the patient/family. Past medical history,  allergies, medications were reviewed. Pertinent imaging, labs and tests included in this note have been reviewed and interpreted independently by me.  Gardner Servantes Mechele Collin, MD [facility] 12/24/2021 8:09 AM  Office Number [phone]

## 2021-12-29 ENCOUNTER — Encounter: Payer: Self-pay | Admitting: Pulmonary Disease

## 2022-02-04 ENCOUNTER — Ambulatory Visit: Payer: Medicare Other | Admitting: Pulmonary Disease

## 2022-02-06 ENCOUNTER — Ambulatory Visit: Payer: Medicare Other | Admitting: Pulmonary Disease

## 2022-02-06 ENCOUNTER — Encounter: Payer: Self-pay | Admitting: Pulmonary Disease

## 2022-02-06 ENCOUNTER — Other Ambulatory Visit: Payer: Self-pay

## 2022-02-06 VITALS — BP 140/76 | HR 67 | Ht 69.0 in | Wt 177.8 lb

## 2022-02-06 DIAGNOSIS — J449 Chronic obstructive pulmonary disease, unspecified: Secondary | ICD-10-CM | POA: Diagnosis not present

## 2022-02-06 MED ORDER — LEVALBUTEROL HCL 0.63 MG/3ML IN NEBU: 0.6300 mg | INHALATION_SOLUTION | Freq: Four times a day (QID) | RESPIRATORY_TRACT | 12 refills | Status: DC | PRN

## 2022-02-06 NOTE — Patient Instructions (Signed)
COPD-asthma overlap  ?--CONTINUE flutter valve twice a day AS NEEDED ?--CONTINUE Xopenex nebulizer twice a day followed by flutter valve. REFILL ?--CONTINUE Trelegy 200-62.5-25 mcg ONE puff ONCE a day. ?--CONTINUE singulair 10 mg daily ?--START mucinex for loose phlegm as needed ? ?Recurrent pneumonia ?Dense consolidation in LLL. Likely area high risk for recurrent pneumonia ?--In the future if you develop s/sx concerning for pneumonia, will need two week course of antibiotics ?--Will consider bronchoscopy if symptoms persistent ? ?Follow-up with me in 5 months ?

## 2022-02-06 NOTE — Progress Notes (Unsigned)
Subjective:   PATIENT ID: Christine Moore GENDER: female DOB: 1946-05-13, MRN: [id number]   HPI  Chief Complaint  Patient presents with   Follow-up    Asthma    Reason for Visit: Follow-up COPD/asthma  [name] is a 76 year old female with asthma, atrial fibrillation s/p ablations and cardioversions, HTN, HLD and chronic headaches who presents as new consult for asthma/COPD management.  Synopsis: 2022 Aug - Established care with [facility]. She was diagnosed with asthma as a teen and COPD in 2019. She recently had pneumonia and completed antibiotics in July. She is on Trelegy, xopenex and Singulair. She reports that she has had pneumonia four times in the last 12 months. She states persistent productive cough that previously interrupted her ability to speak and be in public. Since discharge, she has been taking mucinex twice a day with significant improvement.  Unclear if this is due to medications or due to moving to [place].  No longer having any limitations in activity.   11/08/21 In the last week, she reports worsening cough at night interrupting her sleep. Her cough is productive with brown. Associated wheezing.  Denies fevers, chills. Compliant with Trelegy. She has used rescue inhaler nightly.  12/24/21 On prior telephone visit on 11/08/2021 she was started on prednisone for COPD exacerbation.  She improved on treatment however symptoms did not completely resolve.  On 12/20/2021 she called into the office for productive cough that has worsened in the last week, headache, chest congestion and fatigue.  Reports home COVID test was negative.  NP Parrett called and cough syrup and Tessalon Perles until she can be seen in clinic today. Cough has somewhat improved and persisted. Reports some nasal congestion. Cough one night improved with position but did not improve with reflux. No change with chest congestion/sputum production.  02/06/22 On her last visit she was treated  with prednisone taper and azithromycin.  Also discussed pulmonary hygiene with flutter valve, nebulizer and Mucinex.  Unable to collect sputum culture. She is using her flutter valve twice a day after neb use. No longer coughing at night and now able to sleep.  Asthma Control Test ACT Total Score  02/06/2022 22    Social History: Former smoker. Smoked 1 ppd x 10 years. Quit in 1985. Retired Runner, broadcasting/film/video Significant second smoke exposure Mom had asthma Moving in with her sister  Past Medical History:  Diagnosis Date   Atrial fibrillation (HCC)    Atrial flutter (HCC)    COPD (chronic obstructive pulmonary disease) (HCC)      Family History  Problem Relation Age of Onset   Melanoma Sister    Breast cancer Sister    Endometrial cancer Sister      Social History   Occupational History   Not on file  Tobacco Use   Smoking status: Former    Packs/day: 1.00    Years: 22.00    Pack years: 22.00    Types: Cigarettes    Start date: 1963    Quit date: 1985    Years since quitting: 38.2   Smokeless tobacco: Never  Substance and Sexual Activity   Alcohol use: Yes    Comment: drinks 1 drink per day of all the above   Drug use: Never   Sexual activity: Not on file    Allergies  Allergen Reactions   Penicillins Swelling    Unable to recall  Swelling not throat/face    Quinapril Swelling    Swelling of  face Swelling of face    Latex Itching   Tessalon [Benzonatate] Swelling    Lip swelling. No throat swelling. Improved after stopping and taking benadryl   Formaldehyde Itching   Other Rash     Outpatient Medications Prior to Visit  Medication Sig Dispense Refill   acyclovir (ZOVIRAX) 400 MG tablet Take 400 mg by mouth 3 (three) times daily as needed.     apixaban (ELIQUIS) 5 MG TABS tablet Take 1 tablet (5 mg total) by mouth 2 (two) times daily. 60 tablet 6   atorvastatin (LIPITOR) 10 MG tablet Take 10 mg by mouth at bedtime.     Calcium Polycarbophil (FIBER-CAPS PO) Take  1 capsule by mouth daily.     chlorpheniramine (CHLOR-TRIMETON) 4 MG tablet Take 4 mg by mouth 2 (two) times daily as needed for allergies.     Cholecalciferol 10 MCG (400 UNIT) CAPS Take 800 Units by mouth in the morning and at bedtime.     clobetasol ointment (TEMOVATE) [id number] % Apply 1 application topically daily as needed.     diltiazem (TIAZAC) 240 MG 24 hr capsule Take 1 capsule (240 mg total) by mouth daily. 90 capsule 2   Fluticasone-Umeclidin-Vilant (TRELEGY ELLIPTA) 200-62.5-25 MCG/INH AEPB Inhale 1 puff into the lungs daily.     Ketotifen Fumarate (ALAWAY OP) Place 1 drop into both eyes daily as needed (allergies).     levalbuterol (XOPENEX HFA) 45 MCG/ACT inhaler Inhale 1-2 puffs into the lungs every 4 (four) hours as needed for shortness of breath or wheezing.     levalbuterol (XOPENEX) 0.63 MG/3ML nebulizer solution Take 3 mLs (0.63 mg total) by nebulization every 4 (four) hours as needed for wheezing or shortness of breath. 3 mL 12   losartan (COZAAR) 50 MG tablet Take 1 tablet (50 mg total) by mouth daily. 90 tablet 2   Lutein-Zeaxanthin 25-5 MG CAPS Take 1 capsule by mouth at bedtime.     montelukast (SINGULAIR) 10 MG tablet Take 10 mg by mouth at bedtime.     azithromycin (ZITHROMAX) 250 MG tablet Take as directed on package 6 tablet 0   cefdinir (OMNICEF) 300 MG capsule Take 1 capsule (300 mg total) by mouth 2 (two) times daily. 14 capsule 0   levothyroxine (SYNTHROID) 50 MCG tablet Take 50 mcg by mouth daily.     pantoprazole (PROTONIX) 40 MG tablet Take 40 mg by mouth daily.     No facility-administered medications prior to visit.    Review of Systems  Constitutional:  Negative for chills, diaphoresis, fever, malaise/fatigue and weight loss.  HENT:  Negative for congestion.   Respiratory:  Positive for cough. Negative for hemoptysis, sputum production, shortness of breath and wheezing.   Cardiovascular:  Negative for chest pain, palpitations and leg swelling.     Objective:   Vitals:   02/06/22 1013  BP: 140/76  Pulse: 67  SpO2: 96%  Weight: 177 lb 12.8 oz (80.6 kg)  Height: 5\' 9"  (1.753 m)  SpO2: 96 % O2 Device: None (Room air) Physical Exam: General: Well-appearing, no acute distress HENT: , AT Eyes: EOMI, no scleral icterus Respiratory: Clear to auscultation bilaterally.  No crackles, wheezing or rales Cardiovascular: RRR, -M/R/G, no JVD Extremities:-Edema,-tenderness Neuro: AAO x4, CNII-XII grossly intact Psych: Normal mood, normal affect  Data Reviewed:  Imaging: CTA 05/08/21 - No pulmonary embolus, patchy bilateral lower lobe airspace consolidation left> right, 4 mm right upper lobe perifissural nodule CXR 12/24/2021-right perihilar pneumonia  PFT: None on file  Labs:  Alpha 1-antitrypsin MZ - 91 (LLN 101) Immunoglobulins G/A/M - normal range Eosinophil 05/22/21 - 0    Assessment & Plan:   Discussion: 76 year old female former smoker with COPD-asthma overlap and recurrent pneumonias in the last 12 months. Prior chest imagine which demonstrated left lower lobe consolidation. It is possible that this area poses a high risk for recurrent pneumonia however unable to visualize her parenchyma to determine if there is any focal bronchiectasis or any other abnormality on available imaging. For now I recommend conservative management allowing her lungs to recover and heal.  When she does develop respiratory symptoms concerning for pneumonia, she may need a longer course of antibiotics for 10 to 14 days.  76 year old female former smoker with COPD-asthma overlap and recurrent pneumonias in the last 12 months.  Prior chest imaging which demonstrated left lower lobe consolidation.  It is possible that this area poses a high risk for recurrent pneumonia however unable to visualize her parenchyma to determine if there is any focal bronchiectasis or any other abnormality on available imaging.  When she does develop respiratory symptoms  concerning for pneumonia, she may need a longer course of antibiotics for 10 to 14 days.  No sputum culture available  11/08/21 Symptoms consistent with exacerbation. May be related to pneumonia. Will start with steroids and add antibiotics if symptoms are persistent. 1/20 Worsening symptoms. Given cough meds 12/24/20 Treat for exacerbation. If symptoms persistent, will need to consider bronch  COPD-asthma overlap  --CONTINUE flutter valve twice a day AS NEEDED --CONTINUE Xopenex nebulizer twice a day followed by flutter valve. REFILL --CONTINUE Trelegy 200-62.5-25 mcg ONE puff ONCE a day. --CONTINUE singulair 10 mg daily --START mucinex for loose phlegm as needed  Recurrent pneumonia Dense consolidation in LLL. Likely area high risk for recurrent pneumonia --In the future if you develop s/sx concerning for pneumonia, will need two week course of antibiotics --Will consider bronchoscopy if symptoms persistent  **If needs CT need in the future, she has used alprazolam 1-2 mg PRN for anxiety**  Health Maintenance Immunization History  Administered Date(s) Administered   Influenza Inj Mdck Quad Pf 10/21/2013, 09/01/2019, 09/10/2020   Influenza Split 10/16/2004, 11/08/2009, 09/28/2013   Influenza, High Dose Seasonal PF 10/17/2016, 09/15/2017, 08/29/2021   Influenza, Seasonal, Injecte, Preservative Fre 09/23/2012   Influenza,inj,Quad PF,6+ Mos 09/18/2014, 09/21/2015   Influenza,inj,quad, With Preservative 08/31/2017, 10/07/2017, 09/07/2018, 09/02/2019   Influenza-Unspecified 10/21/2013   Moderna Sars-Covid-2 Vaccination 12/20/2019, 01/17/2020, 01/29/2020, 10/08/2020, 04/02/2021   Pfizer Covid-19 Vaccine Bivalent Booster 13yrs & up 09/29/2021   Pneumococcal Conjugate-13 05/28/2015, 05/27/2016   Pneumococcal Polysaccharide-23 12/21/2008, 12/01/2013, 05/31/2014   Tdap 04/03/2017   Zoster, Live 12/01/2013   CT Lung Screen - discuss for CT  No orders of the defined types were placed in  this encounter.  No orders of the defined types were placed in this encounter.  No follow-ups on file.  I have spent a total time of 30-minutes on the day of the appointment reviewing prior documentation, coordinating care and discussing medical diagnosis and plan with the patient/family. Past medical history, allergies, medications were reviewed. Pertinent imaging, labs and tests included in this note have been reviewed and interpreted independently by me.  [place]ola, MD [facility] 02/06/2022 10:25 AM  Office Number [phone]

## 2022-02-07 ENCOUNTER — Other Ambulatory Visit: Payer: Self-pay | Admitting: Family Medicine

## 2022-02-12 ENCOUNTER — Encounter: Payer: Self-pay | Admitting: Pulmonary Disease

## 2022-04-02 ENCOUNTER — Other Ambulatory Visit: Payer: Self-pay | Admitting: Family Medicine

## 2022-04-02 DIAGNOSIS — E2839 Other primary ovarian failure: Secondary | ICD-10-CM

## 2022-04-07 ENCOUNTER — Encounter (INDEPENDENT_AMBULATORY_CARE_PROVIDER_SITE_OTHER): Payer: Medicare Other | Admitting: Pulmonary Disease

## 2022-04-07 DIAGNOSIS — J449 Chronic obstructive pulmonary disease, unspecified: Secondary | ICD-10-CM

## 2022-04-07 MED ORDER — PREDNISONE 10 MG PO TABS: ORAL_TABLET | ORAL | 0 refills | Status: AC

## 2022-04-07 MED ORDER — MOXIFLOXACIN HCL 400 MG PO TABS: 400.0000 mg | ORAL_TABLET | Freq: Every day | ORAL | 0 refills | Status: DC

## 2022-04-07 NOTE — Telephone Encounter (Signed)
Margaretha Seeds, MD ?to [facility] Triage Pool   ?   5:08 PM ?Please notify patient of plan  ?Margaretha Seeds, MD ?  ?   5:07 PM ?Note ?COPD-asthmas exacerbation ?Rx moxifloxacin and prednisone taper ?If symptoms persist, she will need an office visit for evaluation  ?  ? ?Margaretha Seeds, MD ?  ?   5:06 PM ?Note ?[facility] MyChart Encounter ?  ?"C/o worsening cough over the past three weeks. She increased neb treatment to BID with some relief. Over the past week cough has worsened. Cough is occ productive with yellow to brown sputum, SOB with laying flat and low grade of 99 last week.  ?She is doing trelegy once daily, xopenex solution BID, mucinex 600mg  BID." ?  ?Assessment/Plan ?76 year old female with hx of COPD-asthma. Last treated for exacerbation in January. Now has similar symptoms. Historically has needed prolonged course of antibiotics. ?  ?COPD-asthmas exacerbation ?Rx moxifloxacin and prednisone taper ?If symptoms persist, she will need an office visit for evaluation ?  ?Rodman Pickle, M.D. ?[facility] ?04/07/2022 5:06 PM   ?  ? ? ?I called and spoke with the pt and notified of response per Dr Loanne Drilling  ?She verbalized understanding  ?Rxs already have been sent ?

## 2022-04-07 NOTE — Telephone Encounter (Signed)
Spoke to patient via telephone. ?She is concerned that she may be developing PNA. ?C/o worsening cough over the past three weeks. She increased neb treatment to BID with some relief. Over the past week cough has worsened. Cough is occ productive with yellow to brown sputum, SOB with laying flat and low grade of 99 last week.  ?She is doing trelegy once daily, xopenex solution BID, mucinex 600mg  BID. ?No supplemental oxygen. She does not monitor oxygen levels.  ?  ? ?Dr. Loanne Drilling, please advise. Thanks ?

## 2022-04-07 NOTE — Progress Notes (Signed)
[facility] MyChart Encounter ? ?"C/o worsening cough over the past three weeks. She increased neb treatment to BID with some relief. Over the past week cough has worsened. Cough is occ productive with yellow to brown sputum, SOB with laying flat and low grade of 99 last week.  ?She is doing trelegy once daily, xopenex solution BID, mucinex 600mg  BID." ? ?Assessment/Plan ?76 year old female with hx of COPD-asthma. Last treated for exacerbation in January. Now has similar symptoms. Historically has needed prolonged course of antibiotics. ? ?COPD-asthmas exacerbation ?Rx moxifloxacin and prednisone taper ?If symptoms persist, she will need an office visit for evaluation ? ?February, M.D. ?[facility] ?04/07/2022 5:06 PM  ? ?

## 2022-04-07 NOTE — Patient Instructions (Signed)
COPD-asthmas exacerbation ?Rx moxifloxacin and prednisone taper ?If symptoms persist, she will need an office visit for evaluation ?

## 2022-04-29 ENCOUNTER — Encounter: Payer: Self-pay | Admitting: Cardiology

## 2022-04-29 ENCOUNTER — Ambulatory Visit: Payer: Medicare Other | Admitting: Cardiology

## 2022-04-29 VITALS — BP 142/80 | HR 66 | Ht 69.0 in | Wt 176.0 lb

## 2022-04-29 DIAGNOSIS — I10 Essential (primary) hypertension: Secondary | ICD-10-CM

## 2022-04-29 DIAGNOSIS — I4819 Other persistent atrial fibrillation: Secondary | ICD-10-CM

## 2022-04-29 NOTE — Patient Instructions (Addendum)
Medication Instructions:  Your physician recommends that you continue on your current medications as directed. Please refer to the Current Medication list given to you today. *If you need a refill on your cardiac medications before your next appointment, please call your pharmacy*  Lab Work: None. If you have labs (blood work) drawn today and your tests are completely normal, you will receive your results only by: MyChart Message (if you have MyChart) OR A paper copy in the mail If you have any lab test that is abnormal or we need to change your treatment, we will call you to review the results.  Testing/Procedures: None.  Follow-Up: At [facility], you and your health needs are our priority.  As part of our continuing mission to provide you with exceptional heart care, we have created designated Provider Care Teams.  These Care Teams include your primary Cardiologist (physician) and Advanced Practice Providers (APPs -  Physician Assistants and Nurse Practitioners) who all work together to provide you with the care you need, when you need it.  Your physician wants you to follow-up in: 12 months with Steffanie Dunn, MD. Call the office if you wish to move forward with a watchman device.     You will receive a reminder letter in the mail two months in advance. If you don't receive a letter, please call our office to schedule the follow-up appointment.  We recommend signing up for the patient portal called "MyChart".  Sign up information is provided on this After Visit Summary.  MyChart is used to connect with patients for Virtual Visits (Telemedicine).  Patients are able to view lab/test results, encounter notes, upcoming appointments, etc.  Non-urgent messages can be sent to your provider as well.   To learn more about what you can do with MyChart, go to [email or web address].    Any Other Special Instructions Will Be Listed Below (If Applicable).

## 2022-04-29 NOTE — Progress Notes (Signed)
Electrophysiology Office Follow up Visit Note:    Date:  04/29/2022   ID:  Christine Moore, DOB [birth year], MRN [id number]  PCP:  Pcp, No  [facility] Cardiologist:  None  [facility] Electrophysiologist:  Christine Epley, MD    Interval History:    Christine Moore is a 76 y.o. female who presents for a follow up visit. They were last seen in clinic 06/19/2021. At that appointment she reported a significant decrease in her Afib burden since starting the diltiazem. She was continued on her current medical therapy.   ATRIAL ABLATION SURGERY 2006  s/p pulmonary vein isolation (perfomed at [facility])   ATRIAL ABLATION SURGERY 05/30/2014  Pulmonary vein cryoablation 05/2014   ATRIAL ABLATION SURGERY 03/09/2018  Epicardial left atrial posterior wall ablation (Dr Luetta Nutting)    Overall, she states she is feeling good. She believes she may have had one episode of arrhythmia, possibly flutter. However, she denies any associated symptoms and after ignoring it for a few days this episode resolved.  In clinic today her BP is 142/80, which she attributes to not taking her medication yet this morning. Usually she takes her antihypertensives at 9 AM. She does not monitor her BP very often at home.  Lately she is under significant stress, a family member may be dying of cancer.  She denies any chest pain, shortness of breath, or peripheral edema. No lightheadedness, headaches, syncope, orthopnea, or PND.      Past Medical History:  Diagnosis Date   Atrial fibrillation ([place])    Atrial flutter ([facility])    COPD (chronic obstructive pulmonary disease) ([place])     Past Surgical History:  Procedure Laterality Date   ABDOMINAL HYSTERECTOMY     CARDIOVERSION N/A 05/10/2021   Procedure: CARDIOVERSION;  Surgeon: Jerline Pain, MD;  Location: [facility] ENDOSCOPY;  Service: Cardiovascular;  Laterality: N/A;   OOPHORECTOMY Bilateral    TEE WITHOUT CARDIOVERSION N/A 05/10/2021   Procedure: TRANSESOPHAGEAL  ECHOCARDIOGRAM (TEE);  Surgeon: Jerline Pain, MD;  Location[facility] ENDOSCOPY;  Service: Cardiovascular;  Laterality: N/A;    Current Medications: Current Meds  Medication Sig   acyclovir (ZOVIRAX) 400 MG tablet Take 400 mg by mouth 3 (three) times daily as needed.   apixaban (ELIQUIS) 5 MG TABS tablet Take 1 tablet (5 mg total) by mouth 2 (two) times daily.   atorvastatin (LIPITOR) 10 MG tablet Take 10 mg by mouth at bedtime.   Calcium Polycarbophil (FIBER-CAPS PO) Take 1 capsule by mouth daily.   chlorpheniramine (CHLOR-TRIMETON) 4 MG tablet Take 4 mg by mouth 2 (two) times daily as needed for allergies.   Cholecalciferol 10 MCG (400 UNIT) CAPS Take 800 Units by mouth in the morning and at bedtime.   clobetasol ointment (TEMOVATE) [id number] % Apply 1 application topically daily as needed.   diltiazem (TIAZAC) 240 MG 24 hr capsule Take 1 capsule (240 mg total) by mouth daily.   Fluticasone-Umeclidin-Vilant (TRELEGY ELLIPTA) 200-62.5-25 MCG/INH AEPB Inhale 1 puff into the lungs daily.   Ketotifen Fumarate (ALAWAY OP) Place 1 drop into both eyes daily as needed (allergies).   levalbuterol (XOPENEX HFA) 45 MCG/ACT inhaler Inhale 1-2 puffs into the lungs every 4 (four) hours as needed for shortness of breath or wheezing.   levalbuterol (XOPENEX) 0.63 MG/3ML nebulizer solution Take 3 mLs (0.63 mg total) by nebulization every 6 (six) hours as needed for wheezing or shortness of breath.   losartan (COZAAR) 50 MG tablet Take 1 tablet (50 mg total) by mouth  daily.   Lutein-Zeaxanthin 25-5 MG CAPS Take 1 capsule by mouth at bedtime.   montelukast (SINGULAIR) 10 MG tablet Take 10 mg by mouth at bedtime.     Allergies:   Penicillins, Quinapril, Latex, Tessalon [benzonatate], Formaldehyde, and Other   Social History   Socioeconomic History   Marital status: Widowed    Spouse name: Not on file   Number of children: Not on file   Years of education: Not on file   Highest education level: Not on file   Occupational History   Not on file  Tobacco Use   Smoking status: Former    Packs/day: 1.00    Years: 22.00    Pack years: 22.00    Types: Cigarettes    Start date: 1963    Quit date: 1985    Years since quitting: 38.4   Smokeless tobacco: Never  Substance and Sexual Activity   Alcohol use: Yes    Comment: drinks 1 drink per day of all the above   Drug use: Never   Sexual activity: Not on file  Other Topics Concern   Not on file  Social History Narrative   Not on file   Social Determinants of Health   Financial Resource Strain: Not on file  Food Insecurity: Not on file  Transportation Needs: Not on file  Physical Activity: Not on file  Stress: Not on file  Social Connections: Not on file     Family History: The patient's family history includes Breast cancer in her sister; Endometrial cancer in her sister; Melanoma in her sister.  ROS:   Please see the history of present illness.    (+) Stress All other systems reviewed and are negative.  EKGs/Labs/Other Studies Reviewed:    The following studies were reviewed today:  05/10/2021  Echo TEE  1. Left ventricular ejection fraction, by estimation, is 50 to 55%. The  left ventricle has low normal function. The left ventricle has no regional  wall motion abnormalities.   2. Right ventricular systolic function is normal. The right ventricular  size is normal.   3. Left atrial size was mildly dilated. No left atrial/left atrial  appendage thrombus was detected.   4. Right atrial size was mildly dilated.   5. The mitral valve is normal in structure. Moderate mitral valve  regurgitation. No evidence of mitral stenosis.   6. Tricuspid valve regurgitation is moderate.   7. The aortic valve is tricuspid. Aortic valve regurgitation is moderate.  Mild to moderate aortic valve sclerosis/calcification is present, without  any evidence of aortic stenosis.   8. There is mild (Grade II) plaque.   9. The inferior vena cava is  normal in size with greater than 50%  respiratory variability, suggesting right atrial pressure of 3 mmHg.  05/09/2021  Echo Sonographer Comments: Image acquisition challenging due to COPD and Image acquisition challenging due to respiratory motion.  IMPRESSIONS    1. Poor acoustic windows even with use of Definity Pt in atrial  fibrillation with PVCs during study .   2. Right ventricular systolic function is low normal. The right  ventricular size is normal.   3. Left atrial size was severely dilated.   4. Right atrial size was moderately dilated.   5. Trivial mitral valve regurgitation. Moderate mitral annular  calcification.   6. AV is thickened, calcified with minimally restricted motion. Peak and  mean gradients through the valve are 18 and 9 mm Hg respectively  Diminsionless index is 0.56 consistent  with very mild aortic stenosis. .  The aortic valve is tricuspid. Aortic  valve regurgitation is moderate.   7. The inferior vena cava is normal in size with greater than 50%  respiratory variability, suggesting right atrial pressure of 3 mmHg.   05/08/2021  CTA Chest FINDINGS: Cardiovascular: Satisfactory opacification of the pulmonary arteries to the segmental level. No evidence of pulmonary embolism. Enlarged heart size. No pericardial effusion. 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   Mediastinum/Nodes: No enlarged mediastinal, hilar, or axillary lymph nodes. Thyroid gland, trachea, and esophagus demonstrate no significant findings. Shotty mediastinal lymph, sub pathologic by CT criteria.   Lungs/Pleura: No evidence of pleural effusion or pneumothorax. 4 mm right upper lobe perifissural pulmonary nodule, image 51/101, lung windows. Patchy bilateral lower lobe airspace consolidation, more confluent in the left lower lobe. Consolidation versus atelectasis in the medial segment of the right middle lobe. More subtle scattered ground-glass opacities  present in the upper lobes.   Upper Abdomen: No acute abnormality.   Musculoskeletal: No chest wall abnormality. No acute or significant osseous findings.   Review of the MIP images confirms the above findings.   IMPRESSION: 1. No evidence of pulmonary embolus. 2. Patchy bilateral lower lobe airspace consolidation, more confluent in the left lower lobe. Consolidation versus atelectasis in the medial segment of the right middle lobe. More subtle scattered ground-glass opacities in the upper lobes. These findings may represent multifocal pneumonia. 3. 4 mm right upper lobe perifissural pulmonary nodule. No follow-up needed if patient is low-risk. Non-contrast chest CT can be considered in 12 months if patient is high-risk. This recommendation follows the consensus statement: Guidelines for Management of Incidental Pulmonary Nodules Detected on CT Images: From the Fleischner Society 2017; Radiology 2017; 284:228-243. 4. Enlarged heart size. 5. 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   Aortic Atherosclerosis (ICD10-I70.0).   06/19/2021 ECG sinus rhythm.  First-degree AV delay.  No preexcitation.  QTC is 460 ms.  05/13/2021 ECG AF    EKG:  EKG is personally reviewed.  04/29/2022: Normal Sinus rhythm.  Recent Labs: 05/08/2021: B Natriuretic Peptide 106.1; TSH 1.366 05/10/2021: BUN 17; Creatinine, Ser 0.68; Hemoglobin 13.1; Platelets 281; Potassium 4.4; Sodium 138   Recent Lipid Panel No results found for: CHOL, TRIG, HDL, CHOLHDL, VLDL, LDLCALC, LDLDIRECT  Physical Exam:    VS:  BP (!) 142/80   Pulse 66   Ht 5\' 9"  (1.753 m)   Wt 176 lb (79.8 kg)   SpO2 97%   BMI 25.99 kg/m     Wt Readings from Last 3 Encounters:  04/29/22 176 lb (79.8 kg)  02/06/22 177 lb 12.8 oz (80.6 kg)  12/24/21 175 lb 12.8 oz (79.7 kg)     GEN: Well nourished, well developed in no acute distress HEENT: Normal NECK: No JVD; No carotid bruits LYMPHATICS: No  lymphadenopathy CARDIAC: RRR, no murmurs, rubs, gallops RESPIRATORY:  Clear to auscultation without rales, wheezing or rhonchi  ABDOMEN: Soft, non-tender, non-distended MUSCULOSKELETAL:  No edema; No deformity  SKIN: Warm and dry NEUROLOGIC:  Alert and oriented x 3 PSYCHIATRIC:  Normal affect        ASSESSMENT:    1. Persistent atrial fibrillation ([place])   2. Primary hypertension    PLAN:    In order of problems listed above:  #Persistent atrial fibrillation Maintaining normal rhythm.  On Eliquis right now for stroke prophylaxis.  Is interested in left atrial appendage occlusion.  I did provide her some information about  the procedure today.  She will think about it and let us know if she would like to proceed.  For now, continue Eliquis and diltiazem.  I did discuss the Watchman procedure in detail during today's visit include the risks, recovery and likelihood of success.  I discussed the need for short-term anticoagulation and the need for preprocedural imaging.  #Hypertension Upper limit of normal today.  She has not taken her blood pressure medications yet.  She should check her blood pressures 1-2 times per week and bring this to her primary care physician for review.  Follow-up in 1 year or sooner if she decides to proceed with watchman.     Medication Adjustments/Labs and Tests Ordered: Current medicines are reviewed at length with the patient today.  Concerns regarding medicines are outlined above.  No orders of the defined types were placed in this encounter.  No orders of the defined types were placed in this encounter.   I,Mathew Stumpf,acting as a Education administrator for Christine Epley, MD.,have documented all relevant documentation on the behalf of Christine Epley, MD,as directed by  Christine Epley, MD while in the presence of Christine Epley, MD.  I, Christine Epley, MD, have reviewed all documentation for this visit. The documentation on 04/29/22 for the  exam, diagnosis, procedures, and orders are all accurate and complete.   Signed, Lars Mage, MD, [facility], [facility] 04/29/2022 8:28 AM    Electrophysiology [place]

## 2022-04-30 NOTE — Addendum Note (Signed)
Addended by: Daleen Bo I on: 04/30/2022 07:34 PM   Modules accepted: Orders

## 2022-05-02 ENCOUNTER — Other Ambulatory Visit: Payer: Self-pay

## 2022-05-02 MED ORDER — DILTIAZEM HCL ER BEADS 240 MG PO CP24: 240.0000 mg | ORAL_CAPSULE | Freq: Every day | ORAL | 3 refills | Status: DC

## 2022-05-05 ENCOUNTER — Other Ambulatory Visit: Payer: Self-pay | Admitting: Family Medicine

## 2022-05-05 DIAGNOSIS — E2839 Other primary ovarian failure: Secondary | ICD-10-CM

## 2022-05-06 ENCOUNTER — Other Ambulatory Visit: Payer: Self-pay | Admitting: Family Medicine

## 2022-05-06 ENCOUNTER — Ambulatory Visit
Admission: RE | Admit: 2022-05-06 | Discharge: 2022-05-06 | Disposition: A | Discharge status: Home / Self Care | Payer: Medicare Other | Source: Ambulatory Visit | Attending: Family Medicine | Admitting: Family Medicine

## 2022-05-06 DIAGNOSIS — E2839 Other primary ovarian failure: Secondary | ICD-10-CM

## 2022-05-06 DIAGNOSIS — Z1231 Encounter for screening mammogram for malignant neoplasm of breast: Secondary | ICD-10-CM

## 2022-05-13 ENCOUNTER — Other Ambulatory Visit: Payer: Self-pay

## 2022-05-13 MED ORDER — APIXABAN 5 MG PO TABS: 5.0000 mg | ORAL_TABLET | Freq: Two times a day (BID) | ORAL | 5 refills | Status: DC

## 2022-05-13 NOTE — Telephone Encounter (Signed)
Prescription refill request for Eliquis received. Indication: Atrial Fib Last office visit: 04/29/22  Jeanie Cooks MD Scr: 0.72 on 02/04/22 Age:  76 Weight: 79.8kg  Based on above findings Eliquis 5mg  twice daily is the appropriate dose.  Refill approved.

## 2022-06-09 ENCOUNTER — Encounter: Payer: Self-pay | Admitting: Cardiology

## 2022-06-16 ENCOUNTER — Ambulatory Visit: Payer: Medicare Other | Admitting: Student

## 2022-06-16 VITALS — BP 144/70 | HR 61 | Ht 69.0 in | Wt 176.0 lb

## 2022-06-16 DIAGNOSIS — I4819 Other persistent atrial fibrillation: Secondary | ICD-10-CM

## 2022-06-16 DIAGNOSIS — I10 Essential (primary) hypertension: Secondary | ICD-10-CM | POA: Diagnosis not present

## 2022-06-16 MED ORDER — LOSARTAN POTASSIUM 50 MG PO TABS: ORAL_TABLET | ORAL | 3 refills | Status: DC

## 2022-06-16 NOTE — Progress Notes (Unsigned)
PCP:  Pcp, No Primary Cardiologist: None Electrophysiologist: Lanier Prude, MD   Christine Moore is a 76 y.o. female seen today for Lanier Prude, MD for acute visit due to elevated HTN and dizziness.   Last week pt had an episode of severe vertigo accompanied by nausea and vomiting. It took her several days to feel completely better. She has not seen her PCP for this. Denies palpitations, CP, or SOB. No syncope. Endorses "room spinning" dizziness with history of the same.   Past Medical History:  Diagnosis Date   Atrial fibrillation ([facility])    Atrial flutter ([facility])    COPD (chronic obstructive pulmonary disease) (H[facility])    Past Surgical History:  Procedure Laterality Date   ABDOMINAL HYSTERECTOMY     CARDIOVERSION N/A 05/10/2021   Procedure: CARDIOVERSION;  Surgeon: Jake Bathe, MD;  Location: [facility] ENDOSCOPY;  Service: Cardiovascular;  Laterality: N/A;   OOPHORECTOMY Bilateral    TEE WITHOUT CARDIOVERSION N/A 05/10/2021   Procedure: TRANSESOPHAGEAL ECHOCARDIOGRAM (TEE);  Surgeon: Jake Bathe, MD;  Location: [facility] ENDOSCOPY;  Service: Cardiovascular;  Laterality: N/A;    Current Outpatient Medications  Medication Sig Dispense Refill   acyclovir (ZOVIRAX) 400 MG tablet Take 400 mg by mouth 3 (three) times daily as needed.     apixaban (ELIQUIS) 5 MG TABS tablet Take 1 tablet (5 mg total) by mouth 2 (two) times daily. 60 tablet 5   atorvastatin (LIPITOR) 10 MG tablet Take 10 mg by mouth at bedtime.     Calcium Polycarbophil (FIBER-CAPS PO) Take 1 capsule by mouth daily.     chlorpheniramine (CHLOR-TRIMETON) 4 MG tablet Take 4 mg by mouth 2 (two) times daily as needed for allergies.     Cholecalciferol 10 MCG (400 UNIT) CAPS Take 800 Units by mouth in the morning and at bedtime.     clobetasol ointment (TEMOVATE) 0.05 % Apply 1 application topically daily as needed.     diltiazem (TIAZAC) 240 MG 24 hr capsule Take 1 capsule (240 mg total) by mouth daily. 90 capsule 3    Fluticasone-Umeclidin-Vilant (TRELEGY ELLIPTA) 200-62.5-25 MCG/INH AEPB Inhale 1 puff into the lungs daily.     Ketotifen Fumarate (ALAWAY OP) Place 1 drop into both eyes daily as needed (allergies).     levalbuterol (XOPENEX HFA) 45 MCG/ACT inhaler Inhale 1-2 puffs into the lungs every 4 (four) hours as needed for shortness of breath or wheezing.     levalbuterol (XOPENEX) 0.63 MG/3ML nebulizer solution Take 3 mLs (0.63 mg total) by nebulization every 6 (six) hours as needed for wheezing or shortness of breath. 360 mL 12   levothyroxine (SYNTHROID) 50 MCG tablet Take 50 mcg by mouth daily.     losartan (COZAAR) 50 MG tablet Take 1 tablet (50 mg total) by mouth daily. 90 tablet 2   Lutein-Zeaxanthin 25-5 MG CAPS Take 1 capsule by mouth at bedtime.     montelukast (SINGULAIR) 10 MG tablet Take 10 mg by mouth at bedtime.     Probiotic Product (PROBIOTIC-10 PO) Take 1 tablet by mouth daily.     No current facility-administered medications for this visit.    Allergies  Allergen Reactions   Penicillins Swelling    Unable to recall  Swelling not throat/face    Quinapril Swelling    Swelling of face Swelling of face    Latex Itching   Tessalon [Benzonatate] Swelling    Lip swelling. No throat swelling. Improved after stopping and taking benadryl   Formaldehyde  Itching   Other Rash    Social History   Socioeconomic History   Marital status: Widowed    Spouse name: Not on file   Number of children: Not on file   Years of education: Not on file   Highest education level: Not on file  Occupational History   Not on file  Tobacco Use   Smoking status: Former    Packs/day: 1.00    Years: 22.00    Total pack years: 22.00    Types: Cigarettes    Start date: 25    Quit date: 48    Years since quitting: 38.5   Smokeless tobacco: Never  Substance and Sexual Activity   Alcohol use: Yes    Comment: drinks 1 drink per day of all the above   Drug use: Never   Sexual activity: Not  on file  Other Topics Concern   Not on file  Social History Narrative   Not on file   Social Determinants of Health   Financial Resource Strain: Not on file  Food Insecurity: Not on file  Transportation Needs: Not on file  Physical Activity: Not on file  Stress: Not on file  Social Connections: Not on file  Intimate Partner Violence: Not on file     Review of Systems: All other systems reviewed and are otherwise negative except as noted above.  Physical Exam: Vitals:   06/16/22 1215  BP: (!) 144/70  Pulse: 61  SpO2: 97%  Weight: 176 lb (79.8 kg)  Height: 5\' 9"  (1.753 m)    GEN- The patient is well appearing, alert and oriented x 3 today.   HEENT: normocephalic, atraumatic; sclera clear, conjunctiva pink; hearing intact; oropharynx clear; neck supple, no JVP Lymph- no cervical lymphadenopathy Lungs- Clear to ausculation bilaterally, normal work of breathing.  No wheezes, rales, rhonchi Heart- Regular rate and rhythm, no murmurs, rubs or gallops, PMI not laterally displaced GI- soft, non-tender, non-distended, bowel sounds present, no hepatosplenomegaly Extremities- no clubbing, cyanosis, or edema; DP/PT/radial pulses 2+ bilaterally MS- no significant deformity or atrophy Skin- warm and dry, no rash or lesion Psych- euthymic mood, full affect Neuro- strength and sensation are intact  EKG is ordered. Personal review of EKG from today shows NSR at 60 bpm  Additional studies reviewed include: Previous EP office notes.   Assessment and Plan:  1. Persistent atrial fibrillation NSR on EKG today.  Continue eliquis for stroke prophylaxis. She is considering watchman.   2 HTN Elevated for past several weeks, and looking back for some time.  Increase losartan to 25 mg q am and 50 mg qhs.   3. Vertigo Does not sound like BPPV with no symptoms currently with head turning or rolling over in bed. Chronic component with acute episode last week.  Recommended PCP ->  ?Neurology follow up.   Follow up with Dr. as listed for call back in May of 2024. She will visit with her PCP re: Vertigo, and call also discuss HTN more at that visit.    07-19-2003, PA-C  06/16/22 12:23 PM

## 2022-06-16 NOTE — Patient Instructions (Addendum)
Medication Instructions:  Your physician has recommended you make the following change in your medication:   INCREASE: Losartan 25mg  in the morning and 50mg  in the evening  *If you need a refill on your cardiac medications before your next appointment, please call your pharmacy*   Lab Work: None If you have labs (blood work) drawn today and your tests are completely normal, you will receive your results only by: MyChart Message (if you have MyChart) OR A paper copy in the mail If you have any lab test that is abnormal or we need to change your treatment, we will call you to review the results.   Follow-Up: At [facility], you and your health needs are our priority.  As part of our continuing mission to provide you with exceptional heart care, we have created designated Provider Care Teams.  These Care Teams include your primary Cardiologist (physician) and Advanced Practice Providers (APPs -  Physician Assistants and Nurse Practitioners) who all work together to provide you with the care you need, when you need it.   Your next appointment:    We will send you a letter when it's time to schedule

## 2022-09-01 ENCOUNTER — Encounter (HOSPITAL_COMMUNITY): Payer: Self-pay

## 2022-09-01 ENCOUNTER — Emergency Department (HOSPITAL_COMMUNITY)
Admission: EM | Admit: 2022-09-01 | Discharge: 2022-09-01 | Discharge status: Against Medical Advice | Payer: Medicare Other | Attending: Emergency Medicine | Admitting: Emergency Medicine

## 2022-09-01 DIAGNOSIS — R0789 Other chest pain: Secondary | ICD-10-CM | POA: Diagnosis not present

## 2022-09-01 DIAGNOSIS — Z5321 Procedure and treatment not carried out due to patient leaving prior to being seen by health care provider: Secondary | ICD-10-CM | POA: Diagnosis not present

## 2022-09-01 DIAGNOSIS — R0602 Shortness of breath: Secondary | ICD-10-CM | POA: Insufficient documentation

## 2022-09-01 NOTE — ED Triage Notes (Signed)
Pt arrived via POV, c/o SOB, chest tightness. Denies any known sick contacts.

## 2022-09-04 ENCOUNTER — Telehealth: Payer: Self-pay | Admitting: Cardiology

## 2022-09-04 NOTE — Telephone Encounter (Signed)
The patient feels like she is out of rhythm states she noticed she was out this morning but feeling like it has been longer and she just didn't notice. This is because she has an URI that she went to the ER for on 09/01/22 because she felt so bad. Ended up leaving [facility] and went to [facility] center where she got doxycycline. States when she has Afib she is very weak and can only stand for less than 10 minutes and has to sit down and she can feel her heart beating "really fast". Asked to check VS, patient unable to check at this time. She is getting ready to head to her PCP for a visit because she called them as well. No missed medications. ER precautions given.   Noted EKG at [facility] is Afib rate controled in the 70's.  VS pulled from [facility] system: ED Triage Vitals [09/01/22 2332]  BP 149/69  Heart Rate 85  Resp 20  SpO2 94 %  Temp 98.3 F (36.8 C)  EKG noted to be in Afib there as well.  The patient currently has appt at [facility] on 09/11/22.   Will forward to MD for advisement.

## 2022-09-04 NOTE — Telephone Encounter (Signed)
Pt states her heart is out of rhythm. She states she felt her heart pulse and it felt irregular. Pt asked to speak with Otila Kluver, RN. Pt denies any other symptoms. She called the afib clinic but they told her she couldn't get in until two weeks from now.

## 2022-09-08 ENCOUNTER — Telehealth: Payer: Self-pay | Admitting: Cardiology

## 2022-09-08 ENCOUNTER — Ambulatory Visit (HOSPITAL_COMMUNITY)
Admission: RE | Admit: 2022-09-08 | Discharge: 2022-09-08 | Disposition: A | Discharge status: Home / Self Care | Payer: Medicare Other | Source: Ambulatory Visit | Attending: Physician Assistant | Admitting: Physician Assistant

## 2022-09-08 VITALS — BP 160/86 | HR 57 | Wt 177.6 lb

## 2022-09-08 DIAGNOSIS — I4819 Other persistent atrial fibrillation: Secondary | ICD-10-CM | POA: Insufficient documentation

## 2022-09-08 DIAGNOSIS — D6869 Other thrombophilia: Secondary | ICD-10-CM

## 2022-09-08 NOTE — Progress Notes (Signed)
Primary Care Physician: Pcp, No Referring Physician: Dr. Vickki Muff TONITA Christine Moore is a 76 y.o. female with a h/o afib with multiple ablations  done at a different location in the remote past  that is in the afib clinic today for afib that came on with an upper respiratory infection around 2 weeks ago. She is currently on doxycycline, prednisone taper and decongestants. She is rate controlled in the upper 50's in the office. States she has seen a HR in the 80's at home that is high for her. She had a delayed dose of eliquis of greater than 6 hours the pm of 10/2 when she went to the ED. She has fatigue in afib. She is wanting a cardioversion asap for feeling poorly in afib.   Today, she denies symptoms of palpitations, chest pain, shortness of breath, orthopnea, PND, lower extremity edema, dizziness, presyncope, syncope, or neurologic sequela. The patient is tolerating medications without difficulties and is otherwise without complaint today.   Past Medical History:  Diagnosis Date   Atrial fibrillation (HCC)    Atrial flutter (HCC)    COPD (chronic obstructive pulmonary disease) (HCC)    Past Surgical History:  Procedure Laterality Date   ABDOMINAL HYSTERECTOMY     CARDIOVERSION N/A 05/10/2021   Procedure: CARDIOVERSION;  Surgeon: Jake Bathe, MD;  Location: [facility] ENDOSCOPY;  Service: Cardiovascular;  Laterality: N/A;   OOPHORECTOMY Bilateral    TEE WITHOUT CARDIOVERSION N/A 05/10/2021   Procedure: TRANSESOPHAGEAL ECHOCARDIOGRAM (TEE);  Surgeon: Jake Bathe, MD;  Location: [facility] ENDOSCOPY;  Service: Cardiovascular;  Laterality: N/A;    Current Outpatient Medications  Medication Sig Dispense Refill   acyclovir (ZOVIRAX) 400 MG tablet Take 400 mg by mouth 3 (three) times daily as needed.     apixaban (ELIQUIS) 5 MG TABS tablet Take 1 tablet (5 mg total) by mouth 2 (two) times daily. 60 tablet 5   atorvastatin (LIPITOR) 10 MG tablet Take 10 mg by mouth at bedtime.     Calcium  Polycarbophil (FIBER-CAPS PO) Take 1 capsule by mouth daily.     chlorpheniramine (CHLOR-TRIMETON) 4 MG tablet Take 4 mg by mouth 2 (two) times daily as needed for allergies.     Cholecalciferol 10 MCG (400 UNIT) CAPS Take 800 Units by mouth in the morning and at bedtime.     clobetasol ointment (TEMOVATE) 0.05 % Apply 1 application topically daily as needed.     diltiazem (TIAZAC) 240 MG 24 hr capsule Take 1 capsule (240 mg total) by mouth daily. 90 capsule 3   doxycycline (VIBRAMYCIN) 100 MG capsule Take by mouth.     Fluticasone-Umeclidin-Vilant (TRELEGY ELLIPTA) 200-62.5-25 MCG/INH AEPB Inhale 1 puff into the lungs daily.     hydrocortisone 2.5 % cream Apply topically.     Ketotifen Fumarate (ALAWAY OP) Place 1 drop into both eyes daily as needed (allergies).     levalbuterol (XOPENEX HFA) 45 MCG/ACT inhaler Inhale 1-2 puffs into the lungs every 4 (four) hours as needed for shortness of breath or wheezing.     levalbuterol (XOPENEX) 0.63 MG/3ML nebulizer solution Take 3 mLs (0.63 mg total) by nebulization every 6 (six) hours as needed for wheezing or shortness of breath. 360 mL 12   levothyroxine (SYNTHROID) 50 MCG tablet Take 50 mcg by mouth daily.     losartan (COZAAR) 50 MG tablet Take 25mg  in the morning and 50mg  in the evening daily 135 tablet 3   Lutein-Zeaxanthin 25-5 MG CAPS Take 1 capsule  by mouth at bedtime.     montelukast (SINGULAIR) 10 MG tablet Take 10 mg by mouth at bedtime.     predniSONE (STERAPRED UNI-PAK 21 TAB) 10 MG (21) TBPK tablet Take by mouth.     Probiotic Product (PROBIOTIC-10 PO) Take 1 tablet by mouth daily.     No current facility-administered medications for this encounter.    Allergies  Allergen Reactions   Penicillins Swelling    Unable to recall  Swelling not throat/face    Quinapril Swelling    Swelling of face Swelling of face    Latex Itching   Tessalon [Benzonatate] Swelling    Lip swelling. No throat swelling. Improved after stopping and  taking benadryl   Formaldehyde Itching   Other Rash    Social History   Socioeconomic History   Marital status: Widowed    Spouse name: Not on file   Number of children: Not on file   Years of education: Not on file   Highest education level: Not on file  Occupational History   Not on file  Tobacco Use   Smoking status: Former    Packs/day: 1.00    Years: 22.00    Total pack years: 22.00    Types: Cigarettes    Start date: 42    Quit date: 61    Years since quitting: 38.7   Smokeless tobacco: Never  Substance and Sexual Activity   Alcohol use: Yes    Comment: drinks 1 drink per day of all the above   Drug use: Never   Sexual activity: Not on file  Other Topics Concern   Not on file  Social History Narrative   Not on file   Social Determinants of Health   Financial Resource Strain: Not on file  Food Insecurity: Not on file  Transportation Needs: Not on file  Physical Activity: Not on file  Stress: Not on file  Social Connections: Not on file  Intimate Partner Violence: Not on file    Family History  Problem Relation Age of Onset   Melanoma Sister    Breast cancer Sister 47   Endometrial cancer Sister     ROS- All systems are reviewed and negative except as per the HPI above  Physical Exam: Vitals:   09/08/22 1523  BP: (!) 160/86  Pulse: (!) 57  Weight: 80.6 kg   Wt Readings from Last 3 Encounters:  09/08/22 80.6 kg  09/01/22 79.4 kg  06/16/22 79.8 kg    Labs: Lab Results  Component Value Date   NA 138 05/10/2021   K 4.4 05/10/2021   CL 107 05/10/2021   CO2 23 05/10/2021   GLUCOSE 94 05/10/2021   BUN 17 05/10/2021   CREATININE 0.68 05/10/2021   CALCIUM 9.5 05/10/2021   No results found for: "INR" No results found for: "CHOL", "HDL", "LDLCALC", "TRIG"   GEN- The patient is well appearing, alert and oriented x 3 today.   Head- normocephalic, atraumatic Eyes-  Sclera clear, conjunctiva pink Ears- hearing intact Oropharynx-  clear Neck- supple, no JVP Lymph- no cervical lymphadenopathy Lungs- Clear to ausculation bilaterally, normal work of breathing Heart- irregular rate and rhythm, no murmurs, rubs or gallops, PMI not laterally displaced GI- soft, NT, ND, + BS Extremities- no clubbing, cyanosis, or edema MS- no significant deformity or atrophy Skin- no rash or lesion Psych- euthymic mood, full affect Neuro- strength and sensation are intact  EKG-Vent. rate 57 BPM PR interval * ms QRS duration 98 ms QT/QTcB  384/373 ms P-R-T axes * 89 173 Atrial fibrillation with slow ventricular response Nonspecific ST and T wave abnormality Abnormal ECG When compared with ECG of 01-Sep-2022 18:48, PREVIOUS ECG IS PRESENT    Assessment and Plan:  1. Afib  Rte controlled  Out of rhythm x 2 weeks associated with an upper respiratory infection. Will have to schedule a TEE cardioversion( pt had a delayed dose  of 6-7 hours  pm of 10/2)  Risk vrs benefit discussed with pt   She will be going out of town 14 th thru the 23 rd of October so will need to work around those dates as well as her recovering form URI and finishing prednisone taper  Rate controlled so no adjustment in meds Continue 240 mg Cardizem daily  Bmet/cbc  2. CHA2DS2VASc score of at least 3 Continue eliquis 5 mg bid without any further delayed/missed doses    F/u in afib clinic one week after cardioversion   [name] C. Fredericka Bottcher, [facility] [address] [place], [place] [address] [phone]

## 2022-09-08 NOTE — Telephone Encounter (Addendum)
The patient is unhappy with the wait time for a TEE/DCCV at [facility] that was set up at her [facility]ib Clinic appt today. She would like to talk with Dr. Quentin Ore because "this is unacceptable, and she doesn't want to get a new cardiologist but she will."  Offered next available appt for Dr. Quentin Ore on 12/26/21 and scheduled.  Advised for a sooner DCCV/TEE and feeling bad she can all ways go to the ER.  Will forward to MD for advisement.

## 2022-09-08 NOTE — Patient Instructions (Signed)
Cardioversion scheduled for Tuesday, October 24th  -come to clinic for labs at [place] at the [facility] and go to admitting at [facility] not eat or drink anything after midnight the night prior to your procedure.  - Take all your morning medication (except diabetic medications) with a sip of water prior to arrival.  - You will not be able to drive home after your procedure.  - Do NOT miss any doses of your blood thinner - if you should miss a dose please notify our office immediately.  - If you feel as if you go back into normal rhythm prior to scheduled cardioversion, please notify our office immediately. If your procedure is canceled in the cardioversion suite you will be charged a cancellation fee.

## 2022-09-08 NOTE — Telephone Encounter (Addendum)
Spoke to pt earlier this morning - she was scheduled for appt 10/12. Pt was very disgruntled on the phone (yelling on the phone) that her cardioversion was not already scheduled. Explained she would need to be assessed before cardioversion could be scheduled and if she felt she needed immediate treatment then she should proceed to the ER. Pt states she did that before and waited 17 hours and wasn't interested in doing that again. Re-iterated to pt that the appt this week would be to assess and schedule for cardioversion. Offered first available appt today and pt accepted.

## 2022-09-08 NOTE — Telephone Encounter (Signed)
   Pt is requesting to speak with RN Otila Kluver, she said its very very important to speak with her and if she can call her back today or tomorrow

## 2022-09-08 NOTE — Telephone Encounter (Signed)
Pt called today, pt is upset and crying because she feels like no one is attending to her needs. She states she is miserable because heart is out of rhythm and she wants to schedule a cardioversion. Pt has appt today with the afib clinic at 3:30pm

## 2022-09-10 NOTE — Telephone Encounter (Signed)
See other phone call encounter

## 2022-09-10 NOTE — Telephone Encounter (Signed)
Notified the patient that Dr. Quentin Ore was made aware, advised he was in agreement with plan. Apologized for the inconvenience of timing and advised again if she can not wait because she is feeling bad to go to the ER.  Verbalized understanding.

## 2022-09-11 ENCOUNTER — Ambulatory Visit (HOSPITAL_COMMUNITY): Payer: Medicare Other | Admitting: Physician Assistant

## 2022-09-22 ENCOUNTER — Ambulatory Visit (HOSPITAL_COMMUNITY)
Admission: RE | Admit: 2022-09-22 | Discharge: 2022-09-22 | Disposition: A | Discharge status: Home / Self Care | Payer: Medicare Other | Source: Ambulatory Visit | Attending: Nurse Practitioner | Admitting: Nurse Practitioner

## 2022-09-22 VITALS — HR 69

## 2022-09-22 DIAGNOSIS — I4819 Other persistent atrial fibrillation: Secondary | ICD-10-CM

## 2022-09-22 NOTE — Progress Notes (Signed)
Patient called clinic, felt like she was back in SR. ECG confirms SR HR 69, PR 138, QRS 90, QTc 454. Will cancel DCCV. Follow up with Dr Quentin Ore as scheduled.

## 2022-09-23 ENCOUNTER — Ambulatory Visit (HOSPITAL_COMMUNITY): Admission: RE | Admit: 2022-09-23 | Payer: Medicare Other | Source: Home / Self Care | Admitting: Cardiovascular Disease

## 2022-09-23 ENCOUNTER — Encounter (HOSPITAL_COMMUNITY): Admission: RE | Payer: Self-pay | Source: Home / Self Care

## 2022-09-23 ENCOUNTER — Other Ambulatory Visit (HOSPITAL_COMMUNITY): Payer: Medicare Other | Admitting: Nurse Practitioner

## 2022-09-23 SURGERY — ECHOCARDIOGRAM, TRANSESOPHAGEAL
Anesthesia: Monitor Anesthesia Care

## 2022-09-30 ENCOUNTER — Ambulatory Visit (HOSPITAL_COMMUNITY): Payer: Medicare Other | Admitting: Nurse Practitioner

## 2022-10-01 ENCOUNTER — Ambulatory Visit (HOSPITAL_COMMUNITY)
Admission: RE | Admit: 2022-10-01 | Discharge: 2022-10-01 | Disposition: A | Discharge status: Home / Self Care | Payer: Medicare Other | Source: Ambulatory Visit | Attending: Nurse Practitioner | Admitting: Nurse Practitioner

## 2022-10-01 VITALS — BP 180/70 | HR 75 | Ht 69.0 in | Wt 172.6 lb

## 2022-10-01 DIAGNOSIS — J069 Acute upper respiratory infection, unspecified: Secondary | ICD-10-CM | POA: Insufficient documentation

## 2022-10-01 DIAGNOSIS — I10 Essential (primary) hypertension: Secondary | ICD-10-CM | POA: Diagnosis not present

## 2022-10-01 DIAGNOSIS — Z79899 Other long term (current) drug therapy: Secondary | ICD-10-CM | POA: Diagnosis not present

## 2022-10-01 DIAGNOSIS — I4891 Unspecified atrial fibrillation: Secondary | ICD-10-CM | POA: Insufficient documentation

## 2022-10-01 DIAGNOSIS — D6869 Other thrombophilia: Secondary | ICD-10-CM | POA: Diagnosis not present

## 2022-10-01 DIAGNOSIS — I4819 Other persistent atrial fibrillation: Secondary | ICD-10-CM

## 2022-10-01 LAB — CBC
HCT: 43.5 % (ref 36.0–46.0)
Hemoglobin: 13.9 g/dL (ref 12.0–15.0)
MCH: 29.7 pg (ref 26.0–34.0)
MCHC: 32 g/dL (ref 30.0–36.0)
MCV: 92.9 fL (ref 80.0–100.0)
Platelets: 301 10*3/uL (ref 150–400)
RBC: 4.68 MIL/uL (ref 3.87–5.11)
RDW: 13.6 % (ref 11.5–15.5)
WBC: 7.8 10*3/uL (ref 4.0–10.5)
nRBC: 0 % (ref 0.0–0.2)

## 2022-10-01 LAB — BASIC METABOLIC PANEL
Anion gap: 9 (ref 5–15)
BUN: 12 mg/dL (ref 8–23)
CO2: 25 mmol/L (ref 22–32)
Calcium: 9.7 mg/dL (ref 8.9–10.3)
Chloride: 106 mmol/L (ref 98–111)
Creatinine, Ser: 0.75 mg/dL (ref 0.44–1.00)
GFR, Estimated: >60 mL/min (ref >60)
Glucose, Bld: 103 mg/dL — ABNORMAL HIGH (ref 70–99)
Potassium: 4.6 mmol/L (ref 3.5–5.1)
Sodium: 140 mmol/L (ref 135–145)

## 2022-10-01 NOTE — H&P (View-Only) (Signed)
Primary Care Physician: Lujean Amel, MD Referring Physician: Dr. Marigene Ehlers Christine Moore is a 76 y.o. female with a h/o afib with multiple ablations  done at a different location in the remote past  that is in the afib clinic today for afib that came on with an upper respiratory infection around 2 weeks ago. She is currently on doxycycline, prednisone taper and decongestants. She is rate controlled in the upper 50's in the office. States she has seen a HR in the 80's at home that is high for her. She had a delayed dose of eliquis of greater than 6 hours the pm of 10/2 when she went to the ED. She has fatigue in afib. She is wanting a cardioversion asap for feeling poorly in afib.   F/u in afib clinic, 10/01/22 for pt returning to afib shortly after spontaneous return of afib for which she was set up for cardioversion 10/24 but converted the day before and the procedure was cancelled. She then called the office Monday and said she  was back in afib and wanted a cardioversion. Ekg does show rate controlled afib.   In the office today, we had the conversation that if she is going out of rhythm frequently that cardioversion will  not be the best option to maintain SR long term. She may have to consider antiarrythmic's. She states that she has used Tikosyn in the past and it did not work for her. She does not believe that she has used amiodarone in the past. She is clear in her decision that she feels that a cardioversion will  help her maintain SR long term.. Her echo from 2022 showed mild AS and moderate  AI with severely dilated left atrium. I will update when she returns to [place]. She states that she has had a UTI and just finished keflex.  Today, she denies symptoms of palpitations, chest pain, shortness of breath, orthopnea, PND, lower extremity edema, dizziness, presyncope, syncope, or neurologic sequela. The patient is tolerating medications without difficulties and is otherwise without complaint  today.   Past Medical History:  Diagnosis Date   Atrial fibrillation ([place])    Atrial flutter (HCC)    COPD (chronic obstructive pulmonary disease) ([place])    Past Surgical History:  Procedure Laterality Date   ABDOMINAL HYSTERECTOMY     CARDIOVERSION N/A 05/10/2021   Procedure: CARDIOVERSION;  Surgeon: Jerline Pain, MD;  Location: [facility] ENDOSCOPY;  Service: Cardiovascular;  Laterality: N/A;   OOPHORECTOMY Bilateral    TEE WITHOUT CARDIOVERSION N/A 05/10/2021   Procedure: TRANSESOPHAGEAL ECHOCARDIOGRAM (TEE);  Surgeon: Jerline Pain, MD;  Location: [facility] ENDOSCOPY;  Service: Cardiovascular;  Laterality: N/A;    Current Outpatient Medications  Medication Sig Dispense Refill   acyclovir (ZOVIRAX) 400 MG tablet Take 400 mg by mouth 3 (three) times daily as needed (fever blisters).     apixaban (ELIQUIS) 5 MG TABS tablet Take 1 tablet (5 mg total) by mouth 2 (two) times daily. 60 tablet 5   atorvastatin (LIPITOR) 10 MG tablet Take 10 mg by mouth at bedtime.     Calcium Polycarbophil (FIBER-CAPS PO) Take 4 capsules by mouth 2 (two) times daily.     chlorpheniramine (CHLOR-TRIMETON) 4 MG tablet Take 4 mg by mouth 2 (two) times daily.     Cholecalciferol (VITAMIN D) 50 MCG (2000 UT) tablet Take 2,000 Units by mouth 2 (two) times daily.     clobetasol ointment (TEMOVATE) 2.37 % Apply 1 application  topically  daily as needed (eczema).     diltiazem (TIAZAC) 240 MG 24 hr capsule Take 1 capsule (240 mg total) by mouth daily. 90 capsule 3   Fluticasone-Umeclidin-Vilant (TRELEGY ELLIPTA) 200-62.5-25 MCG/INH AEPB Inhale 1 puff into the lungs daily.     hydrocortisone 2.5 % cream Apply 1 Application topically daily as needed (facial eczema).     Ketotifen Fumarate (ALAWAY OP) Place 1 drop into both eyes daily as needed (allergies).     levalbuterol (XOPENEX HFA) 45 MCG/ACT inhaler Inhale 1-2 puffs into the lungs every 4 (four) hours as needed for shortness of breath or wheezing.     levalbuterol (XOPENEX)  0.63 MG/3ML nebulizer solution Take 3 mLs (0.63 mg total) by nebulization every 6 (six) hours as needed for wheezing or shortness of breath. 360 mL 12   levothyroxine (SYNTHROID) 50 MCG tablet Take 50 mcg by mouth daily.     losartan (COZAAR) 50 MG tablet Take 25mg  in the morning and 50mg  in the evening daily 135 tablet 3   Lutein-Zeaxanthin 25-5 MG CAPS Take 1 capsule by mouth at bedtime.     montelukast (SINGULAIR) 10 MG tablet Take 10 mg by mouth at bedtime.     naproxen sodium (ALEVE) 220 MG tablet Take 220 mg by mouth daily as needed (headaches).     omeprazole (PRILOSEC OTC) 20 MG tablet Take 20 mg by mouth daily before breakfast.     Probiotic Product (PROBIOTIC-10 PO) Take 1 tablet by mouth daily.     sodium chloride (OCEAN) 0.65 % SOLN nasal spray Place 1 spray into both nostrils as needed for congestion.     No current facility-administered medications for this encounter.    Allergies  Allergen Reactions   Penicillins Swelling    Unable to recall  Swelling not throat/face    Quinapril Swelling    Swelling of face    Latex Itching   Tessalon [Benzonatate] Swelling    Lip swelling. No throat swelling. Improved after stopping and taking benadryl   Formaldehyde Itching    Social History   Socioeconomic History   Marital status: Widowed    Spouse name: Not on file   Number of children: Not on file   Years of education: Not on file   Highest education level: Not on file  Occupational History   Not on file  Tobacco Use   Smoking status: Former    Packs/day: 1.00    Years: 22.00    Total pack years: 22.00    Types: Cigarettes    Start date: 44    Quit date: 68    Years since quitting: 38.8   Smokeless tobacco: Never  Substance and Sexual Activity   Alcohol use: Yes    Comment: drinks 1 drink per day of all the above   Drug use: Never   Sexual activity: Not on file  Other Topics Concern   Not on file  Social History Narrative   Not on file   Social  Determinants of Health   Financial Resource Strain: Not on file  Food Insecurity: Not on file  Transportation Needs: Not on file  Physical Activity: Not on file  Stress: Not on file  Social Connections: Not on file  Intimate Partner Violence: Not on file    Family History  Problem Relation Age of Onset   Melanoma Sister    Breast cancer Sister 59   Endometrial cancer Sister     ROS- All systems are reviewed and negative except as  per the HPI above  Physical Exam: Vitals:   10/01/22 0839  BP: (!) 180/70  Pulse: 75  Weight: 78.3 kg  Height: 5\' 9"  (1.753 m)   Wt Readings from Last 3 Encounters:  10/01/22 78.3 kg  09/08/22 80.6 kg  09/01/22 79.4 kg    Labs: Lab Results  Component Value Date   NA 138 05/10/2021   K 4.4 05/10/2021   CL 107 05/10/2021   CO2 23 05/10/2021   GLUCOSE 94 05/10/2021   BUN 17 05/10/2021   CREATININE 0.68 05/10/2021   CALCIUM 9.5 05/10/2021   No results found for: "INR" No results found for: "CHOL", "HDL", "LDLCALC", "TRIG"   GEN- The patient is well appearing, alert and oriented x 3 today.   Head- normocephalic, atraumatic Eyes-  Sclera clear, conjunctiva pink Ears- hearing intact Oropharynx- clear Neck- supple, no JVP Lymph- no cervical lymphadenopathy Lungs- Clear to ausculation bilaterally, normal work of breathing Heart- irregular rate and rhythm, no murmurs, rubs or gallops, PMI not laterally displaced GI- soft, NT, ND, + BS Extremities- no clubbing, cyanosis, or edema MS- no significant deformity or atrophy Skin- no rash or lesion Psych- euthymic mood, full affect Neuro- strength and sensation are intact  EKG- Vent. rate 75 BPM PR interval * ms QRS duration 92 ms QT/QTcB 414/462 ms P-R-T axes * 81 -85 Undetermined rhythm (afib)  ST & T wave abnormality, consider lateral ischemia Abnormal ECG When compared with ECG of 22-Sep-2022 14:45, PREVIOUS ECG IS PRESENT  Echo- 2022-1. Poor acoustic windows even with use of  Definity Pt in atrial  fibrillation with PVCs during study .   2. Right ventricular systolic function is low normal. The right  ventricular size is normal.   3. Left atrial size was severely dilated.   4. Right atrial size was moderately dilated.   5. Trivial mitral valve regurgitation. Moderate mitral annular  calcification.   6. AV is thickened, calcified with minimally restricted motion. Peak and  mean gradients through the valve are 18 and 9 mm Hg respectively  Diminsionless index is 0.56 consistent with very mild aortic stenosis. .  The aortic valve is tricuspid. Aortic  valve regurgitation is moderate.   7. The inferior vena cava is normal in size with greater than 50%  respiratory variability, suggesting right atrial pressure of 3 mmHg.   Assessment and Plan:  1. Afib ( many ablations with pt stating hybrid procedure in 2019 in [place]) Rate controlled  Out of rhythm x 2 weeks associated with an upper respiratory infection. Then converted to SR and cardioversion cancelled This lasted only a few days and afib returned Now wants a cardioversion rescheduled, discussed this may not be successful long term as afib can become progressive over time and may need AAD's States that the cardioversion that she had 18 months ago held until now and she would prefer another one vrs going to antiarrythmic's She states that she has used Tikosyn in the past and it was not effective  Rate controlled so no adjustment in meds Continue 240 mg Cardizem daily  Bmet/cbc  2. CHA2DS2VASc score of at least 3 Continue eliquis 5 mg bid, states no missed doses   3, HTN Elevated in office today She will check at home later today If remains elevated increase losartan to 100 mg daily   F/u in afib clinic one week after cardioversion  Schedule repeat echo at that time   F/u with Dr. New Nathan in January as scheduled   February.  Matthew Folks [facility] [address] [place], [place] [address] [phone]

## 2022-10-01 NOTE — Patient Instructions (Signed)
Cardioversion scheduled for Wednesday, November 8th  - Arrive at the [facility] and go to admitting at [facility] not eat or drink anything after midnight the night prior to your procedure.  - Take all your morning medication (except diabetic medications) with a sip of water prior to arrival.  - You will not be able to drive home after your procedure.  - Do NOT miss any doses of your blood thinner - if you should miss a dose please notify our office immediately.  - If you feel as if you go back into normal rhythm prior to scheduled cardioversion, please notify our office immediately. If your procedure is canceled in the cardioversion suite you will be charged a cancellation fee.

## 2022-10-01 NOTE — Progress Notes (Signed)
Primary Care Physician: Lujean Amel, MD Referring Physician: Dr. Marigene Ehlers Christine Moore is a 76 y.o. female with a h/o afib with multiple ablations  done at a different location in the remote past  that is in the afib clinic today for afib that came on with an upper respiratory infection around 2 weeks ago. She is currently on doxycycline, prednisone taper and decongestants. She is rate controlled in the upper 50's in the office. States she has seen a HR in the 80's at home that is high for her. She had a delayed dose of eliquis of greater than 6 hours the pm of 10/2 when she went to the ED. She has fatigue in afib. She is wanting a cardioversion asap for feeling poorly in afib.   F/u in afib clinic, 10/01/22 for pt returning to afib shortly after spontaneous return of afib for which she was set up for cardioversion 10/24 but converted the day before and the procedure was cancelled. She then called the office Monday and said she  was back in afib and wanted a cardioversion. Ekg does show rate controlled afib.   In the office today, we had the conversation that if she is going out of rhythm frequently that cardioversion will  not be the best option to maintain SR long term. She may have to consider antiarrythmic's. She states that she has used Tikosyn in the past and it did not work for her. She does not believe that she has used amiodarone in the past. She is clear in her decision that she feels that a cardioversion will  help her maintain SR long term.. Her echo from 2022 showed mild AS and moderate  AI with severely dilated left atrium. I will update when she returns to [place]. She states that she has had a UTI and just finished keflex.  Today, she denies symptoms of palpitations, chest pain, shortness of breath, orthopnea, PND, lower extremity edema, dizziness, presyncope, syncope, or neurologic sequela. The patient is tolerating medications without difficulties and is otherwise without complaint  today.   Past Medical History:  Diagnosis Date   Atrial fibrillation ([place])    Atrial flutter ([facility])    COPD (chronic obstructive pulmonary disease) ([place])    Past Surgical History:  Procedure Laterality Date   ABDOMINAL HYSTERECTOMY     CARDIOVERSION N/A 05/10/2021   Procedure: CARDIOVERSION;  Surgeon: Jerline Pain, MD;  Location: [facility] ENDOSCOPY;  Service: Cardiovascular;  Laterality: N/A;   OOPHORECTOMY Bilateral    TEE WITHOUT CARDIOVERSION N/A 05/10/2021   Procedure: TRANSESOPHAGEAL ECHOCARDIOGRAM (TEE);  Surgeon: Jerline Pain, MD;  Location: [facility] ENDOSCOPY;  Service: Cardiovascular;  Laterality: N/A;    Current Outpatient Medications  Medication Sig Dispense Refill   acyclovir (ZOVIRAX) 400 MG tablet Take 400 mg by mouth 3 (three) times daily as needed (fever blisters).     apixaban (ELIQUIS) 5 MG TABS tablet Take 1 tablet (5 mg total) by mouth 2 (two) times daily. 60 tablet 5   atorvastatin (LIPITOR) 10 MG tablet Take 10 mg by mouth at bedtime.     Calcium Polycarbophil (FIBER-CAPS PO) Take 4 capsules by mouth 2 (two) times daily.     chlorpheniramine (CHLOR-TRIMETON) 4 MG tablet Take 4 mg by mouth 2 (two) times daily.     Cholecalciferol (VITAMIN D) 50 MCG (2000 UT) tablet Take 2,000 Units by mouth 2 (two) times daily.     clobetasol ointment (TEMOVATE) 2.37 % Apply 1 application  topically  daily as needed (eczema).     diltiazem (TIAZAC) 240 MG 24 hr capsule Take 1 capsule (240 mg total) by mouth daily. 90 capsule 3   Fluticasone-Umeclidin-Vilant (TRELEGY ELLIPTA) 200-62.5-25 MCG/INH AEPB Inhale 1 puff into the lungs daily.     hydrocortisone 2.5 % cream Apply 1 Application topically daily as needed (facial eczema).     Ketotifen Fumarate (ALAWAY OP) Place 1 drop into both eyes daily as needed (allergies).     levalbuterol (XOPENEX HFA) 45 MCG/ACT inhaler Inhale 1-2 puffs into the lungs every 4 (four) hours as needed for shortness of breath or wheezing.     levalbuterol (XOPENEX)  0.63 MG/3ML nebulizer solution Take 3 mLs (0.63 mg total) by nebulization every 6 (six) hours as needed for wheezing or shortness of breath. 360 mL 12   levothyroxine (SYNTHROID) 50 MCG tablet Take 50 mcg by mouth daily.     losartan (COZAAR) 50 MG tablet Take 25mg  in the morning and 50mg  in the evening daily 135 tablet 3   Lutein-Zeaxanthin 25-5 MG CAPS Take 1 capsule by mouth at bedtime.     montelukast (SINGULAIR) 10 MG tablet Take 10 mg by mouth at bedtime.     naproxen sodium (ALEVE) 220 MG tablet Take 220 mg by mouth daily as needed (headaches).     omeprazole (PRILOSEC OTC) 20 MG tablet Take 20 mg by mouth daily before breakfast.     Probiotic Product (PROBIOTIC-10 PO) Take 1 tablet by mouth daily.     sodium chloride (OCEAN) 0.65 % SOLN nasal spray Place 1 spray into both nostrils as needed for congestion.     No current facility-administered medications for this encounter.    Allergies  Allergen Reactions   Penicillins Swelling    Unable to recall  Swelling not throat/face    Quinapril Swelling    Swelling of face    Latex Itching   Tessalon [Benzonatate] Swelling    Lip swelling. No throat swelling. Improved after stopping and taking benadryl   Formaldehyde Itching    Social History   Socioeconomic History   Marital status: Widowed    Spouse name: Not on file   Number of children: Not on file   Years of education: Not on file   Highest education level: Not on file  Occupational History   Not on file  Tobacco Use   Smoking status: Former    Packs/day: 1.00    Years: 22.00    Total pack years: 22.00    Types: Cigarettes    Start date: 44    Quit date: 68    Years since quitting: 38.8   Smokeless tobacco: Never  Substance and Sexual Activity   Alcohol use: Yes    Comment: drinks 1 drink per day of all the above   Drug use: Never   Sexual activity: Not on file  Other Topics Concern   Not on file  Social History Narrative   Not on file   Social  Determinants of Health   Financial Resource Strain: Not on file  Food Insecurity: Not on file  Transportation Needs: Not on file  Physical Activity: Not on file  Stress: Not on file  Social Connections: Not on file  Intimate Partner Violence: Not on file    Family History  Problem Relation Age of Onset   Melanoma Sister    Breast cancer Sister 59   Endometrial cancer Sister     ROS- All systems are reviewed and negative except as  per the HPI above  Physical Exam: Vitals:   10/01/22 0839  BP: (!) 180/70  Pulse: 75  Weight: 78.3 kg  Height: 5' 9" (1.753 m)   Wt Readings from Last 3 Encounters:  10/01/22 78.3 kg  09/08/22 80.6 kg  09/01/22 79.4 kg    Labs: Lab Results  Component Value Date   NA 138 05/10/2021   K 4.4 05/10/2021   CL 107 05/10/2021   CO2 23 05/10/2021   GLUCOSE 94 05/10/2021   BUN 17 05/10/2021   CREATININE 0.68 05/10/2021   CALCIUM 9.5 05/10/2021   No results found for: "INR" No results found for: "CHOL", "HDL", "LDLCALC", "TRIG"   GEN- The patient is well appearing, alert and oriented x 3 today.   Head- normocephalic, atraumatic Eyes-  Sclera clear, conjunctiva pink Ears- hearing intact Oropharynx- clear Neck- supple, no JVP Lymph- no cervical lymphadenopathy Lungs- Clear to ausculation bilaterally, normal work of breathing Heart- irregular rate and rhythm, no murmurs, rubs or gallops, PMI not laterally displaced GI- soft, NT, ND, + BS Extremities- no clubbing, cyanosis, or edema MS- no significant deformity or atrophy Skin- no rash or lesion Psych- euthymic mood, full affect Neuro- strength and sensation are intact  EKG- Vent. rate 75 BPM PR interval * ms QRS duration 92 ms QT/QTcB 414/462 ms P-R-T axes * 81 -85 Undetermined rhythm (afib)  ST & T wave abnormality, consider lateral ischemia Abnormal ECG When compared with ECG of 22-Sep-2022 14:45, PREVIOUS ECG IS PRESENT  Echo- 2022-1. Poor acoustic windows even with use of  Definity Pt in atrial  fibrillation with PVCs during study .   2. Right ventricular systolic function is low normal. The right  ventricular size is normal.   3. Left atrial size was severely dilated.   4. Right atrial size was moderately dilated.   5. Trivial mitral valve regurgitation. Moderate mitral annular  calcification.   6. AV is thickened, calcified with minimally restricted motion. Peak and  mean gradients through the valve are 18 and 9 mm Hg respectively  Diminsionless index is 0.56 consistent with very mild aortic stenosis. .  The aortic valve is tricuspid. Aortic  valve regurgitation is moderate.   7. The inferior vena cava is normal in size with greater than 50%  respiratory variability, suggesting right atrial pressure of 3 mmHg.   Assessment and Plan:  1. Afib ( many ablations with pt stating hybrid procedure in 2019 in [place]) Rate controlled  Out of rhythm x 2 weeks associated with an upper respiratory infection. Then converted to SR and cardioversion cancelled This lasted only a few days and afib returned Now wants a cardioversion rescheduled, discussed this may not be successful long term as afib can become progressive over time and may need AAD's States that the cardioversion that she had 18 months ago held until now and she would prefer another one vrs going to antiarrythmic's She states that she has used Tikosyn in the past and it was not effective  Rate controlled so no adjustment in meds Continue 240 mg Cardizem daily  Bmet/cbc  2. CHA2DS2VASc score of at least 3 Continue eliquis 5 mg bid, states no missed doses   3, HTN Elevated in office today She will check at home later today If remains elevated increase losartan to 100 mg daily   F/u in afib clinic one week after cardioversion  Schedule repeat echo at that time   F/u with Dr. Lambert in January as scheduled   [place] Jon C.   [facility] [address] [place], [place] [address] [phone]

## 2022-10-05 IMAGING — DX DG CHEST 2V
2 series · 2 of 2 positions shown · non-contrast
Comparison: Chest radiograph dated 05/07/2021.

CLINICAL DATA: Recurrent pneumonia.

EXAM:
CHEST - 2 VIEW

[chest pa]
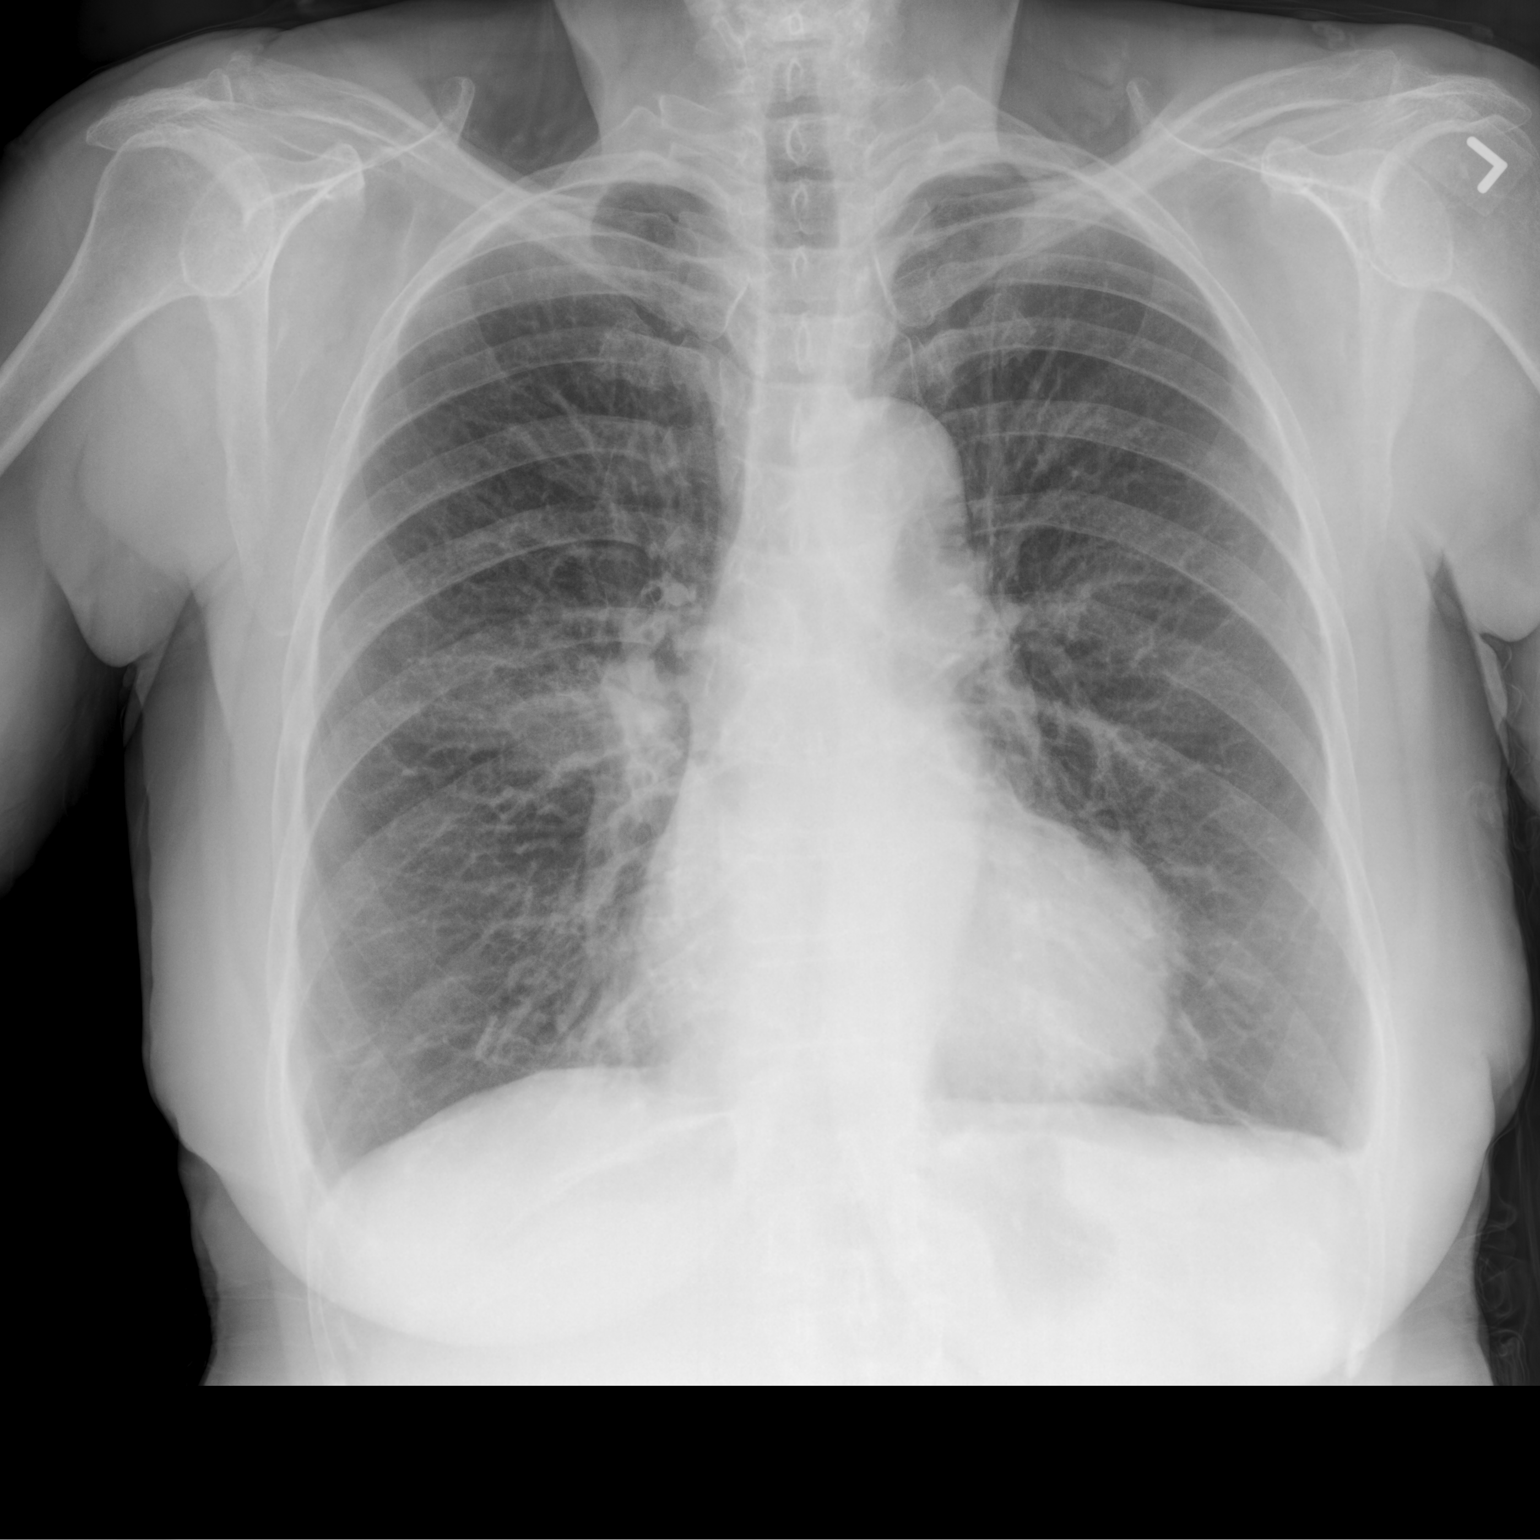

[chest lat]
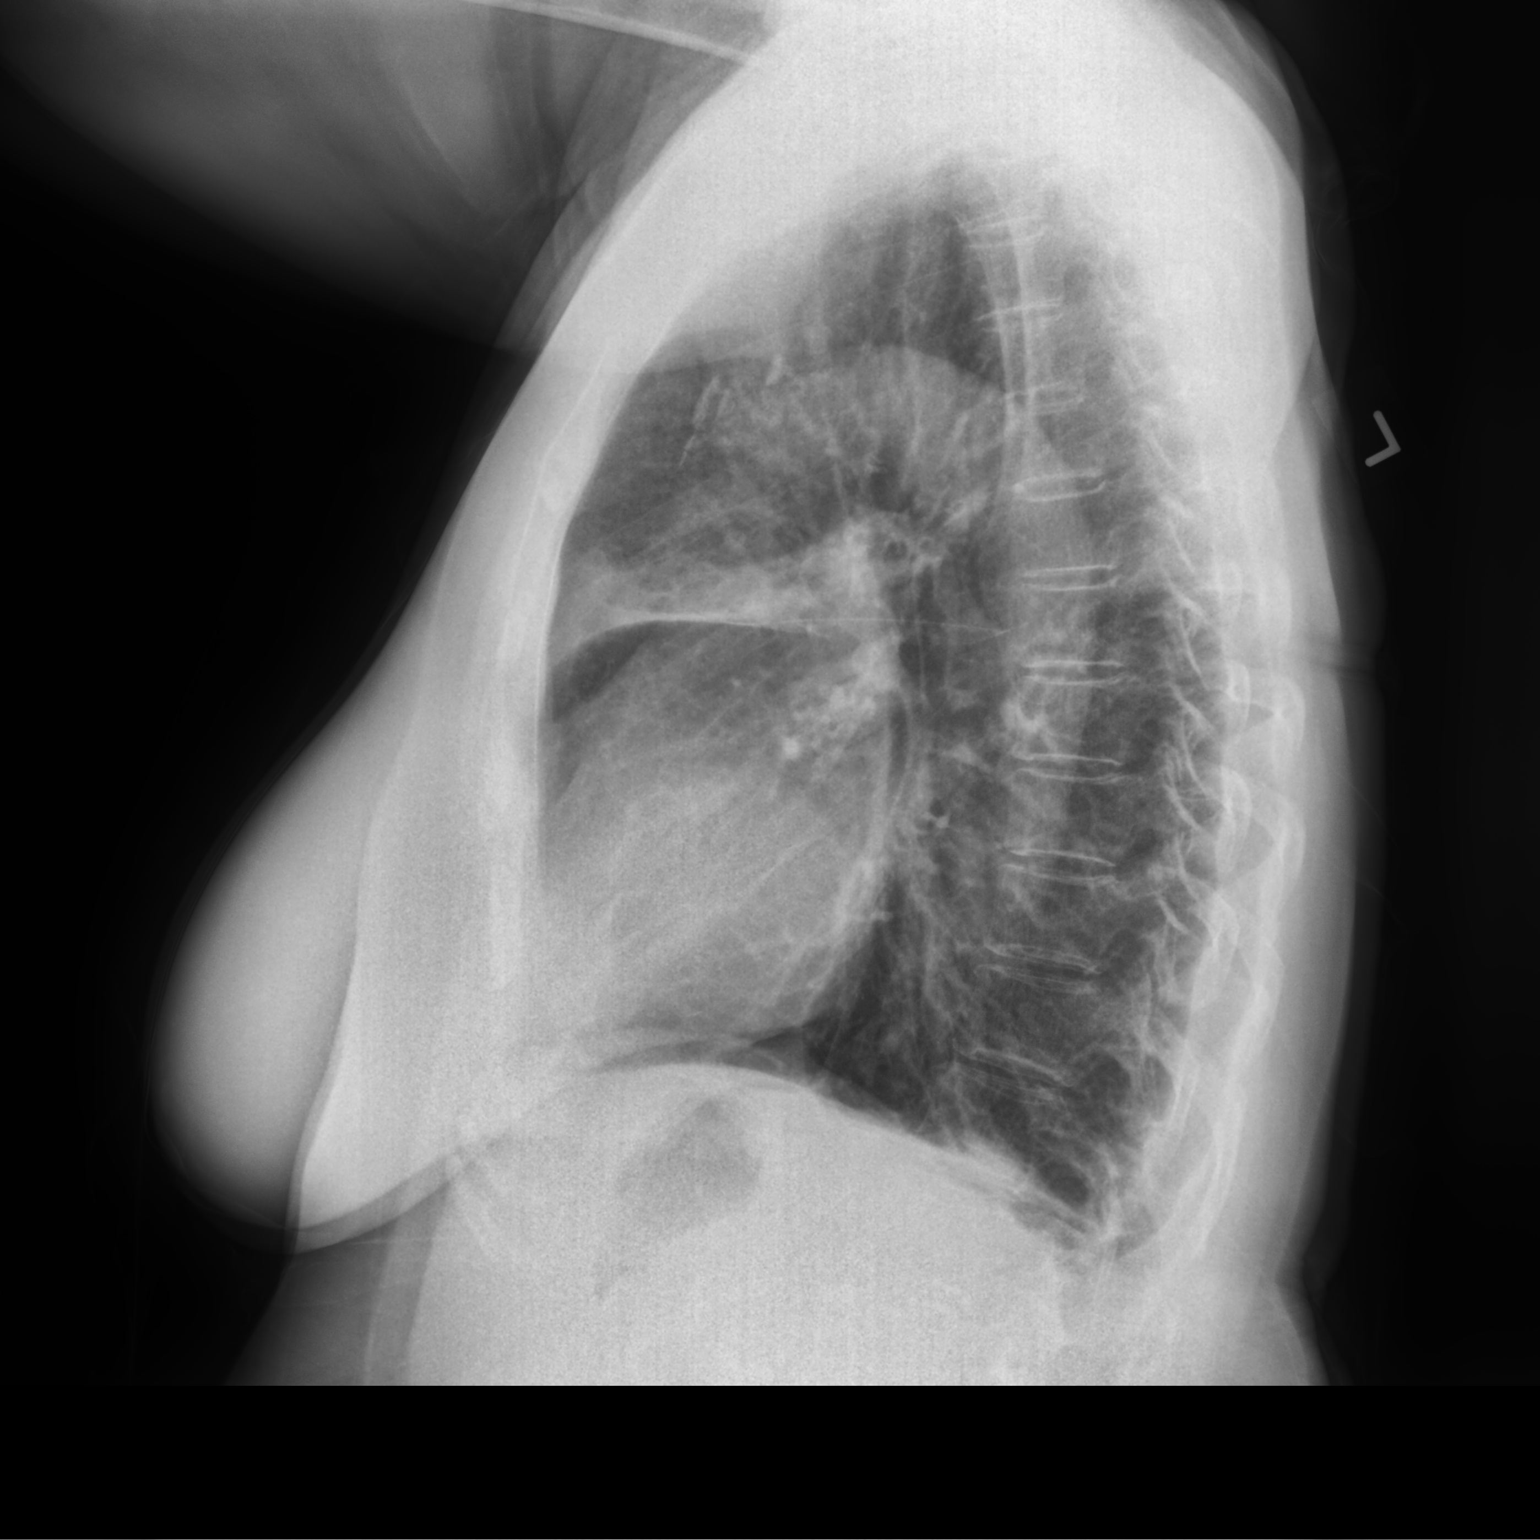

[2 of 2 positions shown; findings below may reference images not displayed]

FINDINGS: Right perihilar airspace opacity consistent with pneumonia. Clinical
correlation and follow-up to resolution recommended. Blunting of the
costophrenic angles, likely scarring. Trace pleural effusions are
not excluded. No pneumothorax. The cardiac silhouette is within
limits. No acute osseous pathology.
IMPRESSION: Right perihilar pneumonia.

## 2022-10-07 NOTE — Anesthesia Preprocedure Evaluation (Addendum)
Anesthesia Evaluation  Patient identified by MRN, date of birth, ID band Patient awake    Reviewed: Allergy & Precautions, NPO status , Patient's Chart, lab work & pertinent test results  Airway Mallampati: II  TM Distance: >3 FB Neck ROM: Full    Dental no notable dental hx.    Pulmonary asthma , COPD,  COPD inhaler, former smoker   Pulmonary exam normal        Cardiovascular hypertension, Pt. on medications + dysrhythmias Atrial Fibrillation  Rhythm:Irregular Rate:Normal     Neuro/Psych negative neurological ROS  negative psych ROS   GI/Hepatic Neg liver ROS,GERD  Medicated,,  Endo/Other  negative endocrine ROS    Renal/GU negative Renal ROS  negative genitourinary   Musculoskeletal negative musculoskeletal ROS (+)    Abdominal Normal abdominal exam  (+)   Peds  Hematology negative hematology ROS (+)   Anesthesia Other Findings   Reproductive/Obstetrics                             Anesthesia Physical Anesthesia Plan  ASA: 3  Anesthesia Plan: General   Post-op Pain Management:    Induction: Intravenous  PONV Risk Score and Plan: 3 and Treatment may vary due to age or medical condition  Airway Management Planned: Mask  Additional Equipment: None  Intra-op Plan:   Post-operative Plan: Extubation in OR  Informed Consent: I have reviewed the patients History and Physical, chart, labs and discussed the procedure including the risks, benefits and alternatives for the proposed anesthesia with the patient or authorized representative who has indicated his/her understanding and acceptance.     Dental advisory given  Plan Discussed with: CRNA  Anesthesia Plan Comments:        Anesthesia Quick Evaluation

## 2022-10-08 ENCOUNTER — Encounter (HOSPITAL_COMMUNITY)
Admission: RE | Disposition: A | Discharge status: Home / Self Care | Payer: Self-pay | Source: Home / Self Care | Attending: Cardiology

## 2022-10-08 ENCOUNTER — Ambulatory Visit (HOSPITAL_COMMUNITY): Payer: Medicare Other | Admitting: Anesthesiology

## 2022-10-08 ENCOUNTER — Ambulatory Visit (HOSPITAL_BASED_OUTPATIENT_CLINIC_OR_DEPARTMENT_OTHER): Payer: Medicare Other | Admitting: Anesthesiology

## 2022-10-08 ENCOUNTER — Other Ambulatory Visit: Payer: Self-pay

## 2022-10-08 ENCOUNTER — Ambulatory Visit (HOSPITAL_COMMUNITY)
Admission: RE | Admit: 2022-10-08 | Discharge: 2022-10-08 | Disposition: A | Discharge status: Home / Self Care | Payer: Medicare Other | Attending: Cardiology | Admitting: Cardiology

## 2022-10-08 ENCOUNTER — Encounter (HOSPITAL_COMMUNITY): Payer: Self-pay | Admitting: Cardiology

## 2022-10-08 DIAGNOSIS — K219 Gastro-esophageal reflux disease without esophagitis: Secondary | ICD-10-CM | POA: Insufficient documentation

## 2022-10-08 DIAGNOSIS — Z87891 Personal history of nicotine dependence: Secondary | ICD-10-CM | POA: Diagnosis not present

## 2022-10-08 DIAGNOSIS — I4891 Unspecified atrial fibrillation: Secondary | ICD-10-CM

## 2022-10-08 DIAGNOSIS — J449 Chronic obstructive pulmonary disease, unspecified: Secondary | ICD-10-CM | POA: Insufficient documentation

## 2022-10-08 DIAGNOSIS — I10 Essential (primary) hypertension: Secondary | ICD-10-CM | POA: Insufficient documentation

## 2022-10-08 HISTORY — PX: CARDIOVERSION: SHX1299

## 2022-10-08 SURGERY — CARDIOVERSION
Anesthesia: General

## 2022-10-08 MED ORDER — LIDOCAINE 2% (20 MG/ML) 5 ML SYRINGE
INTRAMUSCULAR | Status: DC | PRN
  Administered 2022-10-08: 60 mg via INTRAVENOUS

## 2022-10-08 MED ORDER — PROPOFOL 10 MG/ML IV BOLUS
INTRAVENOUS | Status: DC | PRN
  Administered 2022-10-08: 70 mg via INTRAVENOUS

## 2022-10-08 MED ORDER — SODIUM CHLORIDE 0.9 % IV SOLN: INTRAVENOUS | Status: DC

## 2022-10-08 NOTE — Discharge Instructions (Signed)

## 2022-10-08 NOTE — CV Procedure (Signed)
   Electrical Cardioversion Procedure Note Christine Moore [id number] [birth year]  Procedure: Electrical Cardioversion Indications:  Atrial Fibrillation  Time Out: Verified patient identification, verified procedure,medications/allergies/relevent history reviewed, required imaging and test results available.  Performed  Procedure Details  The patient signed informed consent.   The patient was NPO past midnight. Has had therapeutic anticoagulation with Eliquis greater than 3 weeks. The patient denies any interruption of anticoagulation.  Anesthesia was administered by Dr. Nance Pew,   Adequate airway was maintained throughout and vital followed per protocol.  He was cardioverted x 1 with 200J of biphasic synchronized energy.  He converted to NSR.  There were no apparent complications.  The patient tolerated the procedure well and had normal neuro status and respiratory status post procedure with vitals stable as recorded elsewhere.     IMPRESSION:  Successful cardioversion of atrial fibrillation   Follow up:  We will arrange follow up with primary cardiologist.  He will continue on current medical therapy.  The patient advised to continue anticoagulation.  Reggie Bise 10/08/2022, 9:44 AM

## 2022-10-08 NOTE — Anesthesia Postprocedure Evaluation (Signed)
Anesthesia Post Note  Patient: Christine Moore  Procedure(s) Performed: CARDIOVERSION     Patient location during evaluation: PACU Anesthesia Type: General Level of consciousness: awake and alert Pain management: pain level controlled Vital Signs Assessment: post-procedure vital signs reviewed and stable Respiratory status: spontaneous breathing, nonlabored ventilation, respiratory function stable and patient connected to nasal cannula oxygen Cardiovascular status: blood pressure returned to baseline and stable Postop Assessment: no apparent nausea or vomiting Anesthetic complications: no   No notable events documented.  Last Vitals:  Vitals:   10/08/22 1010 10/08/22 1020  BP: (!) 146/66 (!) 164/70  Pulse: 62 62  Resp: (!) 22 17  Temp:    SpO2: 96% 96%    Last Pain:  Vitals:   10/08/22 1020  TempSrc:   PainSc: 0-No pain                 Earl Lites P Blayden Conwell

## 2022-10-08 NOTE — Transfer of Care (Signed)
Immediate Anesthesia Transfer of Care Note  Patient: Christine Moore  Procedure(s) Performed: CARDIOVERSION  Patient Location: Endoscopy Unit  Anesthesia Type:MAC  Level of Consciousness: awake, alert , and oriented  Airway & Oxygen Therapy: Patient Spontanous Breathing  Post-op Assessment: Report given to RN, Post -op Vital signs reviewed and stable, and Patient moving all extremities X 4  Post vital signs: Reviewed and stable  Last Vitals:  Vitals Value Taken Time  BP 122/60 10/08/22 0947  Temp    Pulse 59 10/08/22 0948  Resp 25 10/08/22 0948  SpO2 96 % 10/08/22 0948  Vitals shown include unvalidated device data.  Last Pain:  Vitals:   10/08/22 0902  TempSrc: Tympanic  PainSc: 0-No pain         Complications: No notable events documented.

## 2022-10-08 NOTE — Anesthesia Procedure Notes (Signed)
Procedure Name: General with mask airway Date/Time: 10/08/2022 9:38 AM  Performed by: Quentin Ore, CRNAPre-anesthesia Checklist: Patient identified, Emergency Drugs available, Suction available and Patient being monitored Patient Re-evaluated:Patient Re-evaluated prior to induction Oxygen Delivery Method: Ambu bag Induction Type: IV induction Dental Injury: Teeth and Oropharynx as per pre-operative assessment

## 2022-10-08 NOTE — Interval H&P Note (Signed)
History and Physical Interval Note:  10/08/2022 9:19 AM  Christine Moore  has presented today for surgery, with the diagnosis of atrial fibrillation.  The various methods of treatment have been discussed with the patient and family. After consideration of risks, benefits and other options for treatment, the patient has consented to  Procedure(s): CARDIOVERSION (N/A) as a surgical intervention.  The patient's history has been reviewed, patient examined, no change in status, stable for surgery.  I have reviewed the patient's chart and labs.  Questions were answered to the patient's satisfaction.     Christine Moore

## 2022-10-10 ENCOUNTER — Encounter (HOSPITAL_COMMUNITY): Payer: Self-pay | Admitting: Cardiology

## 2022-10-15 ENCOUNTER — Ambulatory Visit (HOSPITAL_COMMUNITY)
Admission: RE | Admit: 2022-10-15 | Discharge: 2022-10-15 | Disposition: A | Discharge status: Home / Self Care | Payer: Medicare Other | Source: Ambulatory Visit | Attending: Nurse Practitioner | Admitting: Nurse Practitioner

## 2022-10-15 VITALS — BP 162/76 | HR 69 | Ht 69.0 in | Wt 174.2 lb

## 2022-10-15 DIAGNOSIS — Z79899 Other long term (current) drug therapy: Secondary | ICD-10-CM | POA: Insufficient documentation

## 2022-10-15 DIAGNOSIS — J189 Pneumonia, unspecified organism: Secondary | ICD-10-CM

## 2022-10-15 DIAGNOSIS — D6869 Other thrombophilia: Secondary | ICD-10-CM

## 2022-10-15 DIAGNOSIS — I4891 Unspecified atrial fibrillation: Secondary | ICD-10-CM

## 2022-10-15 DIAGNOSIS — Z7901 Long term (current) use of anticoagulants: Secondary | ICD-10-CM | POA: Diagnosis not present

## 2022-10-15 DIAGNOSIS — I10 Essential (primary) hypertension: Secondary | ICD-10-CM | POA: Insufficient documentation

## 2022-10-15 MED ORDER — LOSARTAN POTASSIUM 50 MG PO TABS: 50.0000 mg | ORAL_TABLET | Freq: Two times a day (BID) | ORAL | 2 refills | Status: AC

## 2022-10-15 NOTE — Progress Notes (Signed)
Primary Care Physician: Darrow Bussing, MD Referring Physician: Dr. Vickki Muff Christine Moore is a 76 y.o. female with a h/o afib with multiple ablations  done at a different location in the remote past  that is in the afib clinic today for afib that came on with an upper respiratory infection around 2 weeks ago. She is currently on doxycycline, prednisone taper and decongestants. She is rate controlled in the upper 50's in the office. States she has seen a HR in the 80's at home that is high for her. She had a delayed dose of eliquis of greater than 6 hours the pm of 10/2 when she went to the ED. She has fatigue in afib. She is wanting a cardioversion asap for feeling poorly in afib.   F/u in afib clinic, 10/01/22 for pt returning to afib shortly after spontaneous return of afib for which she was set up for cardioversion 10/24 but converted the day before and the procedure was cancelled. She then called the office Monday and said she  was back in afib and wanted a cardioversion. Ekg does show rate controlled afib.   In the office today, we had the conversation that if she is going out of rhythm frequently that cardioversion will  not be the best option to maintain SR long term. She may have to consider antiarrythmic's. She states that she has used Tikosyn in the past and it did not work for her. She does not believe that she has used amiodarone in the past. She is clear in her decision that she feels that a cardioversion will  help her maintain SR long term.. Her echo from 2022 showed mild AS and moderate  AI with severely dilated left atrium. I will update when she returns to SR. She states that she has had a UTI and just finished keflex.  F/u in afib clinic 10/15/22 after successful cardioversion 11/8. She is in SR today but had some afib last night. We did discuss on previous visits that she may need AAD's at some point but is not ready to commit to this. Her BP is elevated and states runs above  150 systolic at home. Will increase  losartan to 50 mg bid. Will update echo.   Today, she denies symptoms of palpitations, chest pain, shortness of breath, orthopnea, PND, lower extremity edema, dizziness, presyncope, syncope, or neurologic sequela. The patient is tolerating medications without difficulties and is otherwise without complaint today.   Past Medical History:  Diagnosis Date   Atrial fibrillation (HCC)    Atrial flutter (HCC)    COPD (chronic obstructive pulmonary disease) (HCC)    Past Surgical History:  Procedure Laterality Date   ABDOMINAL HYSTERECTOMY     CARDIOVERSION N/A 05/10/2021   Procedure: CARDIOVERSION;  Surgeon: Jake Bathe, MD;  Location: [facility] END[facility]COPY;  Service: Cardiovascular;  Laterality: N/A;   CARDIOVERSION N/A 10/08/2022   Procedure: CARDIOVERSION;  Surgeon: Thomasene Ripple, DO;  Location: [facility] ENDOSCOPY;  Service: Cardiovascular;  Laterality: N/A;   OOPHORECTOMY Bilateral    TEE WITHOUT CARDIOVERSION N/A 05/10/2021   Procedure: TRANSESOPHAGEAL ECHOCARDIOGRAM (TEE);  Surgeon: Jake Bathe, MD;  Location: [facility] ENDOSCOPY;  Service: Cardiovascular;  Laterality: N/A;    Current Outpatient Medications  Medication Sig Dispense Refill   acyclovir (ZOVIRAX) 400 MG tablet Take 400 mg by mouth 3 (three) times daily as needed (fever blisters).     apixaban (ELIQUIS) 5 MG TABS tablet Take 1 tablet (5 mg total) by mouth  2 (two) times daily. 60 tablet 5   atorvastatin (LIPITOR) 10 MG tablet Take 10 mg by mouth at bedtime.     Calcium Polycarbophil (FIBER-CAPS PO) Take 4 capsules by mouth 2 (two) times daily.     chlorpheniramine (CHLOR-TRIMETON) 4 MG tablet Take 4 mg by mouth See admin instructions. Take 4 mg twice daily, may increase to 4 times daily as needed for allergies     Cholecalciferol (VITAMIN D) 50 MCG (2000 UT) tablet Take 2,000 Units by mouth 2 (two) times daily.     ciprofloxacin-dexamethasone (CIPRODEX) OTIC suspension Place 4 drops into the left ear 2  (two) times daily.     clobetasol ointment (TEMOVATE) 0.05 % Apply 1 application  topically daily as needed (eczema).     diltiazem (TIAZAC) 240 MG 24 hr capsule Take 1 capsule (240 mg total) by mouth daily. 90 capsule 3   Fluticasone-Umeclidin-Vilant (TRELEGY ELLIPTA) 200-62.5-25 MCG/INH AEPB Inhale 1 puff into the lungs daily.     hydrocortisone 2.5 % cream Apply 1 Application topically daily as needed (facial eczema).     Ketotifen Fumarate (ALAWAY OP) Place 1 drop into both eyes daily as needed (allergies).     levalbuterol (XOPENEX HFA) 45 MCG/ACT inhaler Inhale 2 puffs into the lungs every 4 (four) hours as needed for shortness of breath or wheezing.     levalbuterol (XOPENEX) 0.63 MG/3ML nebulizer solution Take 3 mLs (0.63 mg total) by nebulization every 6 (six) hours as needed for wheezing or shortness of breath. 360 mL 12   levothyroxine (SYNTHROID) 50 MCG tablet Take 50 mcg by mouth daily.     losartan (COZAAR) 50 MG tablet Take 25mg  in the morning and 50mg  in the evening daily 135 tablet 3   Lutein-Zeaxanthin 25-5 MG CAPS Take 1 capsule by mouth at bedtime.     montelukast (SINGULAIR) 10 MG tablet Take 10 mg by mouth at bedtime.     naproxen sodium (ALEVE) 220 MG tablet Take 220 mg by mouth daily as needed (headaches).     omeprazole (PRILOSEC OTC) 20 MG tablet Take 20 mg by mouth daily before breakfast.     Probiotic Product (PROBIOTIC-10 PO) Take 1 tablet by mouth daily.     sodium chloride (OCEAN) 0.65 % SOLN nasal spray Place 1 spray into both nostrils as needed for congestion.     No current facility-administered medications for this encounter.    Allergies  Allergen Reactions   Penicillins     Unknown reaction    Quinapril Swelling    Swelling of face    Latex Itching   Tessalon [Benzonatate] Swelling    Lip swelling. No throat swelling. Improved after stopping and taking benadryl   Formaldehyde Itching    Social History   Socioeconomic History   Marital status:  Widowed    Spouse name: Not on file   Number of children: Not on file   Years of education: Not on file   Highest education level: Not on file  Occupational History   Not on file  Tobacco Use   Smoking status: Former    Packs/day: 1.00    Years: 22.00    Total pack years: 22.00    Types: Cigarettes    Start date: 1963    Quit date: 67    Years since quitting: 38.8   Smokeless tobacco: Never  Substance and Sexual Activity   Alcohol use: Yes    Comment: drinks 1 drink per day of all the above  Drug use: Never   Sexual activity: Not on file  Other Topics Concern   Not on file  Social History Narrative   Not on file   Social Determinants of Health   Financial Resource Strain: Not on file  Food Insecurity: Not on file  Transportation Needs: Not on file  Physical Activity: Not on file  Stress: Not on file  Social Connections: Not on file  Intimate Partner Violence: Not on file    Family History  Problem Relation Age of Onset   Melanoma Sister    Breast cancer Sister 17   Endometrial cancer Sister     ROS- All systems are reviewed and negative except as per the HPI above  Physical Exam: Vitals:   10/15/22 1017  BP: (!) 162/76  Pulse: 69  Weight: 79 kg  Height: 5\' 9"  (1.753 m)   Wt Readings from Last 3 Encounters:  10/15/22 79 kg  10/08/22 79.4 kg  10/01/22 78.3 kg    Labs: Lab Results  Component Value Date   NA 140 10/01/2022   K 4.6 10/01/2022   CL 106 10/01/2022   CO2 25 10/01/2022   GLUCOSE 103 (H) 10/01/2022   BUN 12 10/01/2022   CREATININE 0.75 10/01/2022   CALCIUM 9.7 10/01/2022   No results found for: "INR" No results found for: "CHOL", "HDL", "LDLCALC", "TRIG"   GEN- The patient is well appearing, alert and oriented x 3 today.   Head- normocephalic, atraumatic Eyes-  Sclera clear, conjunctiva pink Ears- hearing intact Oropharynx- clear Neck- supple, no JVP Lymph- no cervical lymphadenopathy Lungs- Clear to ausculation  bilaterally, normal work of breathing Heart- irregular rate and rhythm, no murmurs, rubs or gallops, PMI not laterally displaced GI- soft, NT, ND, + BS Extremities- no clubbing, cyanosis, or edema MS- no significant deformity or atrophy Skin- no rash or lesion Psych- euthymic mood, full affect Neuro- strength and sensation are intact  EKG-  Vent. rate 69 BPM PR interval 212 ms QRS duration 92 ms QT/QTcB 438/469 ms P-R-T axes 32 97 -33 Sinus rhythm with 1st degree A-V block Rightward axis Nonspecific ST and T wave abnormality Abnormal ECG When compared with ECG of 08-Oct-2022 09:48, PREVIOUS ECG IS PRESENT  Echo- 2022-1. Poor acoustic windows even with use of Definity Pt in atrial  fibrillation with PVCs during study .   2. Right ventricular systolic function is low normal. The right  ventricular size is normal.   3. Left atrial size was severely dilated.   4. Right atrial size was moderately dilated.   5. Trivial mitral valve regurgitation. Moderate mitral annular  calcification.   6. AV is thickened, calcified with minimally restricted motion. Peak and  mean gradients through the valve are 18 and 9 mm Hg respectively  Diminsionless index is 0.56 consistent with very mild aortic stenosis. .  The aortic valve is tricuspid. Aortic  valve regurgitation is moderate.   7. The inferior vena cava is normal in size with greater than 50%  respiratory variability, suggesting right atrial pressure of 3 mmHg.   Assessment and Plan:  1. Afib ( many ablations with pt stating hybrid procedure in 2019 in [place]) Rate controlled  Out of rhythm x 2 weeks associated with an upper respiratory infection. Then converted to SR and cardioversion cancelled This lasted only a few days and afib returned Now wants a cardioversion rescheduled, discussed this may not be successful long term as afib can become progressive over time and may need AAD's States that  the cardioversion that she had 18  months ago held until now and she would prefer another one vrs going to antiarrythmic's She did have a successful cardioversion 10/08/22  and is in SR today but had some afib last night Again discussed she may need to have AAD in the future She will discuss with Dr. Lalla BrothersLambert at her appointment in January She states that she has used Tikosyn in the past and it was not effective  Continue 240 mg Cardizem daily  Update echo  2. CHA2DS2VASc score of at least 3 Continue eliquis 5 mg bid  3, HTN Elevated in office today Elevated at home  Increase losartan to 50 mg bid  Bmet in 2 weeks  Follow BP's at home and let me know if not better controlled with a systolic less than 140    F/u with Dr. Lalla BrothersLambert in January as scheduled   Elvina Sidleonna C. Matthew Folksarroll, ANP-C [facility] [address] [place], [place] [address] [phone]

## 2022-10-15 NOTE — Patient Instructions (Signed)
Increase  losartan 50 mg twice a day

## 2022-10-17 ENCOUNTER — Ambulatory Visit (HOSPITAL_COMMUNITY)
Admission: RE | Admit: 2022-10-17 | Discharge: 2022-10-17 | Disposition: A | Discharge status: Home / Self Care | Payer: Medicare Other | Source: Ambulatory Visit | Attending: Nurse Practitioner | Admitting: Nurse Practitioner

## 2022-10-17 DIAGNOSIS — J449 Chronic obstructive pulmonary disease, unspecified: Secondary | ICD-10-CM | POA: Diagnosis not present

## 2022-10-17 DIAGNOSIS — I4891 Unspecified atrial fibrillation: Secondary | ICD-10-CM | POA: Insufficient documentation

## 2022-10-17 DIAGNOSIS — I351 Nonrheumatic aortic (valve) insufficiency: Secondary | ICD-10-CM | POA: Insufficient documentation

## 2022-10-17 DIAGNOSIS — Z87891 Personal history of nicotine dependence: Secondary | ICD-10-CM | POA: Insufficient documentation

## 2022-10-17 DIAGNOSIS — I4892 Unspecified atrial flutter: Secondary | ICD-10-CM | POA: Insufficient documentation

## 2022-10-17 LAB — ECHOCARDIOGRAM COMPLETE
AR max vel: 1.63 cm2
AV Area VTI: 1.47 cm2
AV Area mean vel: 1.72 cm2
AV Mean grad: 10.5 mmHg
AV Peak grad: 23.5 mmHg
Ao pk vel: 2.43 m/s
Area-P 1/2: 3.45 cm2
P 1/2 time: 503 msec
S' Lateral: 3.1 cm

## 2022-10-17 NOTE — Progress Notes (Signed)
  Echocardiogram 2D Echocardiogram has been performed.  Christine Moore 10/17/2022, 11:08 AM

## 2022-10-31 ENCOUNTER — Ambulatory Visit (HOSPITAL_COMMUNITY)
Admission: RE | Admit: 2022-10-31 | Discharge: 2022-10-31 | Disposition: A | Discharge status: Home / Self Care | Payer: Medicare Other | Source: Ambulatory Visit | Attending: Nurse Practitioner | Admitting: Nurse Practitioner

## 2022-10-31 DIAGNOSIS — I4819 Other persistent atrial fibrillation: Secondary | ICD-10-CM | POA: Insufficient documentation

## 2022-10-31 LAB — BASIC METABOLIC PANEL
Anion gap: 9 (ref 5–15)
BUN: 13 mg/dL (ref 8–23)
CO2: 25 mmol/L (ref 22–32)
Calcium: 9.8 mg/dL (ref 8.9–10.3)
Chloride: 103 mmol/L (ref 98–111)
Creatinine, Ser: 0.69 mg/dL (ref 0.44–1.00)
GFR, Estimated: >60 mL/min (ref >60)
Glucose, Bld: 113 mg/dL — ABNORMAL HIGH (ref 70–99)
Potassium: 3.6 mmol/L (ref 3.5–5.1)
Sodium: 137 mmol/L (ref 135–145)

## 2022-11-06 ENCOUNTER — Other Ambulatory Visit: Payer: Self-pay | Admitting: Cardiology

## 2022-11-06 DIAGNOSIS — I4819 Other persistent atrial fibrillation: Secondary | ICD-10-CM

## 2022-11-06 NOTE — Telephone Encounter (Signed)
Prescription refill request for Eliquis received. Indication: Afib  Last office visit: 10/15/22 [name]) Scr: 0.69 (10/31/22) Age: 76 Weight: 79kg  Appropriate dose and refill sent to requested pharmacy.

## 2022-11-07 ENCOUNTER — Encounter (HOSPITAL_COMMUNITY): Payer: Self-pay

## 2022-12-19 ENCOUNTER — Other Ambulatory Visit: Payer: Self-pay | Admitting: Pulmonary Disease

## 2022-12-19 DIAGNOSIS — J441 Chronic obstructive pulmonary disease with (acute) exacerbation: Secondary | ICD-10-CM

## 2022-12-25 NOTE — Progress Notes (Deleted)
Electrophysiology Office Follow up Visit Note:    Date:  12/25/2022   ID:  Christine Moore, DOB 08/23/1946, MRN [id number]  PCP:  Lujean Amel, MD  [facility] Cardiologist:  None  [facility] Electrophysiologist:  Vickie Epley, MD    Interval History:    Christine Moore is a 77 y.o. female who presents for a follow up visit. They were last seen in clinic 04/29/2022. Has a history of 2 prior endocardial ablations and a convergent ablation in 2019. She takes eliquis. At our last appt, she expressed interest in [place]. She has seen [name] in the AF clinic and they discussed options for rhythm control including antiarrhythmic medications.        Past Medical History:  Diagnosis Date   Atrial fibrillation (HCC)    Atrial flutter (HCC)    COPD (chronic obstructive pulmonary disease) ([place])     Past Surgical History:  Procedure Laterality Date   ABDOMINAL HYSTERECTOMY     CARDIOVERSION N/A 05/10/2021   Procedure: CARDIOVERSION;  Surgeon: Jerline Pain, MD;  Location: [facility] ENDOSCOPY;  Service: Cardiovascular;  Laterality: N/A;   CARDIOVERSION N/A 10/08/2022   Procedure: CARDIOVERSION;  Surgeon: Berniece Salines, DO;  Location: [facility] ENDOSCOPY;  Service: Cardiovascular;  Laterality: N/A;   OOPHORECTOMY Bilateral    TEE WITHOUT CARDIOVERSION N/A 05/10/2021   Procedure: TRANSESOPHAGEAL ECHOCARDIOGRAM (TEE);  Surgeon: Jerline Pain, MD;  Location[facility] ENDOSCOPY;  Service: Cardiovascular;  Laterality: N/A;    Current Medications: No outpatient medications have been marked as taking for the 12/26/22 encounter (Appointment) with Vickie Epley, MD.     Allergies:   Penicillins, Quinapril, Latex, Tessalon [benzonatate], and Formaldehyde   Social History   Socioeconomic History   Marital status: Widowed    Spouse name: Not on file   Number of children: Not on file   Years of education: Not on file   Highest education level: Not on file  Occupational History   Not on file   Tobacco Use   Smoking status: Former    Packs/day: 1.00    Years: 22.00    Total pack years: 22.00    Types: Cigarettes    Start date: 65    Quit date: 85    Years since quitting: 39.0   Smokeless tobacco: Never  Substance and Sexual Activity   Alcohol use: Yes    Comment: drinks 1 drink per day of all the above   Drug use: Never   Sexual activity: Not on file  Other Topics Concern   Not on file  Social History Narrative   Not on file   Social Determinants of Health   Financial Resource Strain: Not on file  Food Insecurity: Not on file  Transportation Needs: Not on file  Physical Activity: Not on file  Stress: Not on file  Social Connections: Not on file     Family History: The patient's family history includes Breast cancer (age of onset: 38) in her sister; Endometrial cancer in her sister; Melanoma in her sister.  ROS:   Please see the history of present illness.    All other systems reviewed and are negative.  EKGs/Labs/Other Studies Reviewed:    The following studies were reviewed today:   EKG:  The ekg ordered today demonstrates ***  Recent Labs: 10/01/2022: Hemoglobin 13.9; Platelets 301 10/31/2022: BUN 13; Creatinine, Ser 0.69; Potassium 3.6; Sodium 137  Recent Lipid Panel No results found for: "CHOL", "TRIG", "HDL", "CHOLHDL", "VLDL", "LDLCALC", "LDLDIRECT"  Physical  Exam:    VS:  There were no vitals taken for this visit.    Wt Readings from Last 3 Encounters:  10/15/22 174 lb 3.2 oz (79 kg)  10/08/22 175 lb (79.4 kg)  10/01/22 172 lb 9.6 oz (78.3 kg)     GEN: *** Well nourished, well developed in no acute distress CARDIAC: ***RRR, no murmurs, rubs, gallops       ASSESSMENT:    1. Persistent atrial fibrillation ([place])   2. Primary hypertension    PLAN:    In order of problems listed above:  #Persistent AF S/p 2 prior endocardial and 1 epicardial ablation. On eliquis for stroke ppx.  Discussed treatment options for rhythm  control today including repeat ablation and AAD. Discussed amiodarone and dofetilide. She previously failed dofetilide.   Also discussed LAAO today.   #Hypertension *** goal today.  Recommend checking blood pressures 1-2 times per week at home and recording the values.  Recommend bringing these recordings to the primary care physician.    Medication Adjustments/Labs and Tests Ordered: Current medicines are reviewed at length with the patient today.  Concerns regarding medicines are outlined above.  No orders of the defined types were placed in this encounter.  No orders of the defined types were placed in this encounter.    Signed, Lars Mage, MD, [facility], [facility] 12/25/2022 9:29 PM    Electrophysiology [facility]

## 2022-12-26 ENCOUNTER — Ambulatory Visit: Payer: Medicare Other | Attending: Cardiology | Admitting: Cardiology

## 2022-12-26 ENCOUNTER — Encounter: Payer: Self-pay | Admitting: Cardiology

## 2022-12-26 VITALS — BP 160/84 | HR 69 | Ht 69.0 in | Wt 173.0 lb

## 2022-12-26 DIAGNOSIS — I10 Essential (primary) hypertension: Secondary | ICD-10-CM

## 2022-12-26 DIAGNOSIS — I4819 Other persistent atrial fibrillation: Secondary | ICD-10-CM

## 2022-12-26 NOTE — Progress Notes (Signed)
Electrophysiology Office Follow up Visit Note:    Date:  12/26/2022   ID:  Christine Moore, DOB [birth year], MRN [id number]  PCP:  Lujean Amel, MD  [facility] Cardiologist:  None  [facility] Electrophysiologist:  Vickie Epley, MD    Interval History:    Christine Moore is a 77 y.o. female who presents for a follow up visit. They were last seen in clinic 04/29/2022. Has a history of 2 prior endocardial ablations and a convergent ablation in 2019. She takes eliquis. At our last appt, she expressed interest in [place]. She has seen [name] in the AF clinic and they discussed options for rhythm control including antiarrhythmic medications.   Today, she is not feeling well. She states she has been out of rhythm since 08/31/2022. She is frustrated with her care. Recently she presented to the [facility] ED 11/26/22 with pneumonia. She notes that she was found to be in atrial flutter.  Typically she is aware of her atrial fibrillation episodes as she is symptomatic. Today she is in normal rhythm and is generally asymptomatic.  In clinic today her blood pressure is 160/84.  She is compliant with her Eliquis BID. No bleeding issues.  She denies any palpitations, chest pain, shortness of breath, or peripheral edema. No lightheadedness, headaches, syncope, orthopnea, or PND.      Past Medical History:  Diagnosis Date   Atrial fibrillation (HCC)    Atrial flutter (HCC)    COPD (chronic obstructive pulmonary disease) ([place])     Past Surgical History:  Procedure Laterality Date   ABDOMINAL HYSTERECTOMY     CARDIOVERSION N/A 05/10/2021   Procedure: CARDIOVERSION;  Surgeon: Jerline Pain, MD;  Location: [facility] ENDOSCOPY;  Service: Cardiovascular;  Laterality: N/A;   CARDIOVERSION N/A 10/08/2022   Procedure: CARDIOVERSION;  Surgeon: Berniece Salines, DO;  Location: [facility] ENDOSCOPY;  Service: Cardiovascular;  Laterality: N/A;   OOPHORECTOMY Bilateral    TEE WITHOUT CARDIOVERSION N/A 05/10/2021    Procedure: TRANSESOPHAGEAL ECHOCARDIOGRAM (TEE);  Surgeon: Jerline Pain, MD;  Location: [facility] ENDOSCOPY;  Service: Cardiovascular;  Laterality: N/A;    Current Medications: Current Meds  Medication Sig   acyclovir (ZOVIRAX) 400 MG tablet Take 400 mg by mouth 3 (three) times daily as needed (fever blisters).   atorvastatin (LIPITOR) 10 MG tablet Take 10 mg by mouth at bedtime.   chlorpheniramine (CHLOR-TRIMETON) 4 MG tablet Take 4 mg by mouth See admin instructions. Take 4 mg twice daily, may increase to 4 times daily as needed for allergies   Cholecalciferol (VITAMIN D) 50 MCG (2000 UT) tablet Take 2,000 Units by mouth 2 (two) times daily.   clobetasol ointment (TEMOVATE) 3.82 % Apply 1 application  topically daily as needed (eczema).   diltiazem (TIAZAC) 240 MG 24 hr capsule Take 1 capsule (240 mg total) by mouth daily.   ELIQUIS 5 MG TABS tablet Take 1 tablet (5 mg total) by mouth 2 (two) times daily.   Fluticasone-Umeclidin-Vilant (TRELEGY ELLIPTA) 200-62.5-25 MCG/INH AEPB Inhale 1 puff into the lungs daily.   hydrocortisone 2.5 % cream Apply 1 Application topically daily as needed (facial eczema).   Ketotifen Fumarate (ALAWAY OP) Place 1 drop into both eyes daily as needed (allergies).   levalbuterol (XOPENEX HFA) 45 MCG/ACT inhaler Inhale 2 puffs into the lungs every 4 (four) hours as needed for shortness of breath or wheezing.   levalbuterol (XOPENEX) 0.63 MG/3ML nebulizer solution Take 3 mLs (0.63 mg total) by nebulization every 6 (six) hours as needed for  wheezing or shortness of breath.   losartan (COZAAR) 50 MG tablet Take 1 tablet (50 mg total) by mouth 2 (two) times daily.   Lutein-Zeaxanthin 25-5 MG CAPS Take 1 capsule by mouth at bedtime.   montelukast (SINGULAIR) 10 MG tablet Take 10 mg by mouth at bedtime.   naproxen sodium (ALEVE) 220 MG tablet Take 220 mg by mouth daily as needed (headaches).   omeprazole (PRILOSEC OTC) 20 MG tablet Take 20 mg by mouth daily before  breakfast.   Probiotic Product (PROBIOTIC-10 PO) Take 1 tablet by mouth daily.   sodium chloride (OCEAN) 0.65 % SOLN nasal spray Place 1 spray into both nostrils as needed for congestion.     Allergies:   Penicillins, Quinapril, Latex, Tessalon [benzonatate], and Formaldehyde   Social History   Socioeconomic History   Marital status: Widowed    Spouse name: Not on file   Number of children: Not on file   Years of education: Not on file   Highest education level: Not on file  Occupational History   Not on file  Tobacco Use   Smoking status: Former    Packs/day: 1.00    Years: 22.00    Total pack years: 22.00    Types: Cigarettes    Start date: 64    Quit date: 30    Years since quitting: 39.0   Smokeless tobacco: Never  Substance and Sexual Activity   Alcohol use: Yes    Comment: drinks 1 drink per day of all the above   Drug use: Never   Sexual activity: Not on file  Other Topics Concern   Not on file  Social History Narrative   Not on file   Social Determinants of Health   Financial Resource Strain: Not on file  Food Insecurity: Not on file  Transportation Needs: Not on file  Physical Activity: Not on file  Stress: Not on file  Social Connections: Not on file     Family History: The patient's family history includes Breast cancer (age of onset: 78) in her sister; Endometrial cancer in her sister; Melanoma in her sister.  ROS:   Please see the history of present illness.    All other systems reviewed and are negative.  EKGs/Labs/Other Studies Reviewed:    The following studies were reviewed today:   EKG:  The ekg ordered today demonstrates sinus rhythm.  QTc 471 ms.  Recent Labs: 10/01/2022: Hemoglobin 13.9; Platelets 301 10/31/2022: BUN 13; Creatinine, Ser 0.69; Potassium 3.6; Sodium 137   Recent Lipid Panel No results found for: "CHOL", "TRIG", "HDL", "CHOLHDL", "VLDL", "LDLCALC", "LDLDIRECT"  Physical Exam:    VS:  BP (!) 160/84   Pulse 69    Ht 5\' 9"  (1.753 m)   Wt 173 lb (78.5 kg)   SpO2 95%   BMI 25.55 kg/m     Wt Readings from Last 3 Encounters:  12/26/22 173 lb (78.5 kg)  10/15/22 174 lb 3.2 oz (79 kg)  10/08/22 175 lb (79.4 kg)     GEN: Well nourished, well developed in no acute distress CARDIAC: RRR, no murmurs, rubs, gallops       ASSESSMENT:    1. Persistent atrial fibrillation ([place])   2. Primary hypertension    PLAN:    In order of problems listed above:   #Persistent AF S/p 2 prior endocardial and 1 epicardial ablation. On eliquis for stroke ppx. Recent episode of atrial fibrillation/flutter triggered by lung infection.  Resolved after treatment. Continue diltiazem.  Continue Eliquis for stroke prophylaxis.  Given the multiple prior ablations including 1 epicardial, she is poor candidate for repeat ablation attempt.  We discussed using amiodarone for better rhythm control given her persistent atrial fibrillation with multiple prior ablation attempts.  She is not a candidate for Tikosyn given the borderline QTc and 2 prior trials of Tikosyn that failed.  The patient would like to avoid amiodarone for now given the possibility of off target effects.  If the atrial fibrillation episodes become more frequent, could revisit the conversation surrounding amiodarone.   #Hypertension Above goal today.  Recommend checking blood pressures 1-2 times per week at home and recording the values.  Recommend bringing these recordings to the primary care physician.   Follow-up 1 year with APP.   Medication Adjustments/Labs and Tests Ordered: Current medicines are reviewed at length with the patient today.  Concerns regarding medicines are outlined above.   No orders of the defined types were placed in this encounter.  No orders of the defined types were placed in this encounter.  I,Mathew Stumpf,acting as a Neurosurgeon for Lanier Prude, MD.,have documented all relevant documentation on the behalf of Lanier Prude, MD,as directed by  Lanier Prude, MD while in the presence of Lanier Prude, MD.  I, Lanier Prude, MD, have reviewed all documentation for this visit. The documentation on 12/26/22 for the exam, diagnosis, procedures, and orders are all accurate and complete.   Signed, Steffanie Dunn, MD, [facility], [facility] 12/26/2022 10:21 AM    Electrophysiology [facility]

## 2022-12-26 NOTE — Patient Instructions (Signed)
Medication Instructions:  Your physician recommends that you continue on your current medications as directed. Please refer to the Current Medication list given to you today.  *If you need a refill on your cardiac medications before your next appointment, please call your pharmacy*  Follow-Up: At [facility], you and your health needs are our priority.  As part of our continuing mission to provide you with exceptional heart care, we have created designated Provider Care Teams.  These Care Teams include your primary Cardiologist (physician) and Advanced Practice Providers (APPs -  Physician Assistants and Nurse Practitioners) who all work together to provide you with the care you need, when you need it.  Your next appointment:   1 year(s)  Provider:   You will see one of the following Advanced Practice Providers on your designated Care Team:   Renee Ursuy, PA-C Michael "Andy" Tillery, PA-C Suzann Riddle, NP  

## 2023-01-08 ENCOUNTER — Encounter (HOSPITAL_COMMUNITY): Payer: Self-pay | Admitting: *Deleted

## 2023-01-22 ENCOUNTER — Other Ambulatory Visit (HOSPITAL_BASED_OUTPATIENT_CLINIC_OR_DEPARTMENT_OTHER): Payer: Self-pay

## 2023-01-22 ENCOUNTER — Encounter (HOSPITAL_BASED_OUTPATIENT_CLINIC_OR_DEPARTMENT_OTHER): Payer: Self-pay | Admitting: Pulmonary Disease

## 2023-01-22 ENCOUNTER — Ambulatory Visit (HOSPITAL_BASED_OUTPATIENT_CLINIC_OR_DEPARTMENT_OTHER): Payer: Medicare Other | Admitting: Pulmonary Disease

## 2023-01-22 ENCOUNTER — Ambulatory Visit (INDEPENDENT_AMBULATORY_CARE_PROVIDER_SITE_OTHER): Payer: Medicare Other | Admitting: Pulmonary Disease

## 2023-01-22 VITALS — BP 140/66 | HR 64 | Ht 69.0 in | Wt 172.8 lb

## 2023-01-22 DIAGNOSIS — J4489 Other specified chronic obstructive pulmonary disease: Secondary | ICD-10-CM

## 2023-01-22 LAB — PULMONARY FUNCTION TEST
DL/VA % pred: 137 %
DL/VA: 5.41 ml/min/mmHg/L
DLCO cor % pred: 101 %
DLCO cor: 22.67 ml/min/mmHg
DLCO unc % pred: 101 %
DLCO unc: 22.53 ml/min/mmHg
FEF 25-75 Post: 1.06 L/sec
FEF 25-75 Pre: 0.85 L/sec
FEF2575-%Change-Post: 25 %
FEF2575-%Pred-Post: 58 %
FEF2575-%Pred-Pre: 47 %
FEV1-%Change-Post: 7 %
FEV1-%Pred-Post: 61 %
FEV1-%Pred-Pre: 57 %
FEV1-Post: 1.53 L
FEV1-Pre: 1.43 L
FEV1FVC-%Change-Post: -5 %
FEV1FVC-%Pred-Pre: 90 %
FEV6-%Change-Post: 7 %
FEV6-%Pred-Post: 71 %
FEV6-%Pred-Pre: 66 %
FEV6-Post: 2.27 L
FEV6-Pre: 2.11 L
FEV6FVC-%Change-Post: 0 %
FEV6FVC-%Pred-Post: 104 %
FEV6FVC-%Pred-Pre: 104 %
FVC-%Change-Post: 13 %
FVC-%Pred-Post: 72 %
FVC-%Pred-Pre: 63 %
FVC-Post: 2.41 L
FVC-Pre: 2.12 L
Post FEV1/FVC ratio: 64 %
Post FEV6/FVC ratio: 99 %
Pre FEV1/FVC ratio: 68 %
Pre FEV6/FVC Ratio: 100 %
RV % pred: 178 %
RV: 4.65 L
TLC % pred: 121 %
TLC: 7.02 L

## 2023-01-22 MED ORDER — LEVALBUTEROL TARTRATE 45 MCG/ACT IN AERO: 2.0000 | INHALATION_SPRAY | Freq: Four times a day (QID) | RESPIRATORY_TRACT | 5 refills | Status: DC | PRN

## 2023-01-22 NOTE — Progress Notes (Signed)
Full PFT Performed Today. 

## 2023-01-22 NOTE — Patient Instructions (Signed)
Full PFT Performed Today. 

## 2023-01-22 NOTE — Patient Instructions (Addendum)
COPD-asthma overlap  --CONTINUE Trelegy 200-62.5-25 mcg ONE puff ONCE a day. --ORDER/CONTINUE flutter valve twice a day AS NEEDED --CONTINUE Xopenex nebulizers twice a day followed by flutter valve. REFILL --CONTINUE singulair 10 mg daily --Consider pulmonary rehab  Follow-up with me in 3 months

## 2023-01-22 NOTE — Progress Notes (Signed)
Subjective:   PATIENT ID: Christine Moore GENDER: female DOB: 05/21/1946, MRN: [id number]   HPI  Chief Complaint  Patient presents with   Follow-up    Lots of new things to discuss with you   Reason for Visit: Follow-up COPD/asthma  Ms. Alvesta Odell is a 77 year old female with asthma, atrial fibrillation s/p ablations and cardioversions, HTN, HLD and chronic headaches who presents as new consult for asthma/COPD management.  Synopsis: She was diagnosed with asthma as a teen and COPD in 2019. She is on Trelegy, xopenex and Singulair. She reports that she has had pneumonia four times from Aug 2021- Aug 2022. Has persistent productive cough that previously interrupted her ability to speak and be in public.   [id number] Aug - Established care with [facility]. Exacerbation in 10/2021 2023 Exacerbation in Jan and May. ED visit in December for CAP c/b atrial fib. Using flutter valve and nebs.   01/22/23 Since our last visit she was treated for exacerbation in May. She has been to the ED with for atrial fibrillation, triggered by URI. She wishes to better control her lung issues to minimize her heart issues. She is compliant with her Trelegy daily and has been needing nebulizer daily. She reports sometimes a productive cough. Has sensation of mucous stuck in her throat. Denies fevers or chills. No using mucinex  Asthma Control Test ACT Total Score  02/06/2022 10:08 AM 22    Social History: Former smoker. Smoked 1 ppd x 10 years. Quit in 1985. Retired Pharmacist, hospital Significant second smoke exposure Mom had asthma Moving in with her sister  Past Medical History:  Diagnosis Date   Atrial fibrillation ([place])    Atrial flutter ([facility])    COPD (chronic obstructive pulmonary disease) ([place])      Family History  Problem Relation Age of Onset   Melanoma Sister    Breast cancer Sister 50   Endometrial cancer Sister      Social History   Occupational History   Not on file  Tobacco Use    Smoking status: Former    Packs/day: 1.00    Years: 22.00    Total pack years: 22.00    Types: Cigarettes    Start date: 1963    Quit date: 1985    Years since quitting: 39.1   Smokeless tobacco: Never  Substance and Sexual Activity   Alcohol use: Yes    Comment: drinks 1 drink per day of all the above   Drug use: Never   Sexual activity: Not on file    Allergies  Allergen Reactions   Penicillins     Unknown reaction    Quinapril Swelling    Swelling of face    Latex Itching   Tessalon [Benzonatate] Swelling    Lip swelling. No throat swelling. Improved after stopping and taking benadryl   Formaldehyde Itching     Outpatient Medications Prior to Visit  Medication Sig Dispense Refill   acyclovir (ZOVIRAX) 400 MG tablet Take 400 mg by mouth 3 (three) times daily as needed (fever blisters).     atorvastatin (LIPITOR) 10 MG tablet Take 10 mg by mouth at bedtime.     chlorpheniramine (CHLOR-TRIMETON) 4 MG tablet Take 4 mg by mouth See admin instructions. Take 4 mg twice daily, may increase to 4 times daily as needed for allergies     Cholecalciferol (VITAMIN D) 50 MCG (2000 UT) tablet Take 2,000 Units by mouth 2 (two) times daily.  clobetasol ointment (TEMOVATE) [id number]-123456789 % Apply 1 application  topically daily as needed (eczema).     diltiazem (TIAZAC) 240 MG 24 hr capsule Take 1 capsule (240 mg total) by mouth daily. 90 capsule 3   ELIQUIS 5 MG TABS tablet Take 1 tablet (5 mg total) by mouth 2 (two) times daily. 60 tablet 5   Fluticasone-Umeclidin-Vilant (TRELEGY ELLIPTA) 200-62.5-25 MCG/INH AEPB Inhale 1 puff into the lungs daily.     hydrocortisone 2.5 % cream Apply 1 Application topically daily as needed (facial eczema).     Ketotifen Fumarate (ALAWAY OP) Place 1 drop into both eyes daily as needed (allergies).     levalbuterol (XOPENEX HFA) 45 MCG/ACT inhaler Inhale 2 puffs into the lungs every 4 (four) hours as needed for shortness of breath or wheezing.     levalbuterol  (XOPENEX) 0.63 MG/3ML nebulizer solution Take 3 mLs (0.63 mg total) by nebulization every 6 (six) hours as needed for wheezing or shortness of breath. 360 mL 12   losartan (COZAAR) 50 MG tablet Take 1 tablet (50 mg total) by mouth 2 (two) times daily. 180 tablet 2   Lutein-Zeaxanthin 25-5 MG CAPS Take 1 capsule by mouth at bedtime.     montelukast (SINGULAIR) 10 MG tablet Take 10 mg by mouth at bedtime.     naproxen sodium (ALEVE) 220 MG tablet Take 220 mg by mouth daily as needed (headaches).     omeprazole (PRILOSEC OTC) 20 MG tablet Take 20 mg by mouth daily before breakfast.     Probiotic Product (PROBIOTIC-10 PO) Take 1 tablet by mouth daily.     sodium chloride (OCEAN) 0.65 % SOLN nasal spray Place 1 spray into both nostrils as needed for congestion.     levothyroxine (SYNTHROID) 50 MCG tablet Take 50 mcg by mouth daily.     No facility-administered medications prior to visit.    Review of Systems  Constitutional:  Negative for chills, diaphoresis, fever, malaise/fatigue and weight loss.  HENT:  Negative for congestion.   Respiratory:  Positive for sputum production. Negative for cough, hemoptysis, shortness of breath and wheezing.   Cardiovascular:  Positive for palpitations. Negative for chest pain and leg swelling.     Objective:   Vitals:   01/22/23 0933  BP: (!) 140/66  Pulse: 64  SpO2: 96%  Weight: 172 lb 12.8 oz (78.4 kg)  Height: '5\' 9"'$  (1.753 m)  SpO2: 96 % O2 Device: None (Room air)  Physical Exam: General: Well-appearing, no acute distress HENT: [place], AT Eyes: EOMI, no scleral icterus Respiratory: Clear to auscultation bilaterally.  No crackles, wheezing or rales Cardiovascular: RRR, -M/R/G, no JVD Extremities:-Edema,-tenderness Neuro: AAO x4, CNII-XII grossly intact Psych: Normal mood, normal affect  Data Reviewed:  Imaging: CTA 05/08/21 - No pulmonary embolus, patchy bilateral lower lobe airspace consolidation left> right, 4 mm right upper lobe perifissural  nodule CXR 12/24/2021-right perihilar pneumonia  PFT: 01/26/23 FVC 2.41 (72%) FEV1 1.53 (61%) Ratio 64  TLC 121% TV 178% RV/TLC 141% DLCO 101% Interpretation: Moderate obstructive defect with significant bronchodilator response in FVC. Hyperinflation and air trapping present.  Labs: Alpha 1-antitrypsin MZ - 91 (LLN 101) Immunoglobulins G/A/M - normal range Eosinophil 05/22/21 - 0    Assessment & Plan:   Discussion: 77 year old female former smoker with COPD-asthma overlap and recurrent exacerbations +/- pneumonia at least 2-3 times annually. Last treated for pneumonia in 10/2022, two months ago. Discussed clinical course and management of COPD/asthma including bronchodilator regimen and action plan for exacerbation.  COPD-asthma overlap  --CONTINUE Trelegy 200-62.5-25 mcg ONE puff ONCE a day. --ORDER/CONTINUE flutter valve twice a day AS NEEDED --CONTINUE Xopenex nebulizers twice a day followed by flutter valve. REFILL --CONTINUE singulair 10 mg daily --REFER pulmonary rehab  Hx recurrent pneumonia Dense consolidation in LLL. Likely area high risk for recurrent pneumonia --In the future if you develop s/sx concerning for pneumonia, will need two week course of antibiotics --Will consider bronchoscopy if symptoms persistent  **If needs CT need in the future, she has used alprazolam 1-2 mg PRN for anxiety**  Health Maintenance Immunization History  Administered Date(s) Administered   Influenza Inj Mdck Quad Pf 10/21/2013, 09/01/2019, 09/10/2020   Influenza Split 10/16/2004, 11/08/2009, 09/28/2013   Influenza, High Dose Seasonal PF 10/17/2016, 09/15/2017, 08/29/2021   Influenza, Seasonal, Injecte, Preservative Fre 09/23/2012   Influenza,inj,Quad PF,6+ Mos 09/18/2014, 09/21/2015   Influenza,inj,quad, With Preservative 08/31/2017, 10/07/2017, 09/07/2018, 09/02/2019   Influenza-Unspecified 10/21/2013   Moderna Sars-Covid-2 Vaccination 12/20/2019, 01/17/2020, 10/08/2020, 04/02/2021    Pfizer Covid-19 Vaccine Bivalent Booster 74yr & up 09/29/2021   Pneumococcal Conjugate-13 05/28/2015, 05/27/2016   Pneumococcal Polysaccharide-23 12/21/2008, 12/01/2013, 05/31/2014   Tdap 04/03/2017   Zoster, Live 12/01/2013   CT Lung Screen - discuss for CT  Orders Placed This Encounter  Procedures   AMB referral to pulmonary rehabilitation    Referral Priority:   Routine    Referral Type:   Consultation    Number of Visits Requested:   1   Flutter valve    DME: [facility]  Needs new flutter valve    Standing Status:   Future    Standing Expiration Date:   01/23/2024   Meds ordered this encounter  Medications   levalbuterol (XOPENEX HFA) 45 MCG/ACT inhaler    Sig: Inhale 2 puffs into the lungs 4 (four) times daily as needed for shortness of breath or wheezing.    Dispense:  1 each    Refill:  5   Return in about 3 months (around 04/22/2023).  I have spent a total time of 31-minutes on the day of the appointment including chart review, data review, collecting history, coordinating care and discussing medical diagnosis and plan with the patient/family. Past medical history, allergies, medications were reviewed. Pertinent imaging, labs and tests included in this note have been reviewed and interpreted independently by me.  [place] MD [facility] 01/22/2023 10:13 AM  Office Number [phone]

## 2023-01-23 ENCOUNTER — Encounter (HOSPITAL_BASED_OUTPATIENT_CLINIC_OR_DEPARTMENT_OTHER): Payer: Self-pay | Admitting: Pulmonary Disease

## 2023-01-23 NOTE — Telephone Encounter (Signed)
Mychart sent by pt:  Christine Moore  P [facility] Clinical Pool (supporting Chi Rodman Pickle, MD)7 hours ago (6:21 AM)   SM As we were talking yesterday, you suggested that I use a salt water gargle to help my throat.  After I got home I remembered it, but didn't remember any details like how much salt, how often to do it, how long to gargle, etc.  When we talk today, please fill me in on these details.  Thanks, [name]      Dr. Loanne Drilling, please advise.

## 2023-01-26 ENCOUNTER — Encounter (HOSPITAL_BASED_OUTPATIENT_CLINIC_OR_DEPARTMENT_OTHER): Payer: Self-pay | Admitting: Pulmonary Disease

## 2023-01-26 ENCOUNTER — Telehealth (HOSPITAL_COMMUNITY): Payer: Self-pay

## 2023-01-26 NOTE — Telephone Encounter (Signed)
Received referral from Dr. Loanne Drilling for this pt to participate in Pulmonary Rehab with the diagnosis of COPD stage 2. Clinical review of pt follow up appt on 01/22/23 Pulmonary office note. Pt appropriate for scheduling for Pulmonary rehab. Will forward to support staff for scheduling and verification of insurance eligibility/benefits with pt consent.   Sheppard Plumber Cardiac and Pul[facility] Rehab

## 2023-02-04 ENCOUNTER — Other Ambulatory Visit: Payer: Self-pay | Admitting: Cardiology

## 2023-02-06 ENCOUNTER — Encounter (HOSPITAL_COMMUNITY): Payer: Self-pay

## 2023-02-13 ENCOUNTER — Telehealth (HOSPITAL_COMMUNITY): Payer: Self-pay

## 2023-02-13 NOTE — Telephone Encounter (Signed)
Pt called to confirm PR orientation for 3/18. Pt stated she will be there

## 2023-02-16 ENCOUNTER — Encounter (HOSPITAL_COMMUNITY): Payer: Self-pay

## 2023-02-16 ENCOUNTER — Encounter (HOSPITAL_COMMUNITY)
Admission: RE | Admit: 2023-02-16 | Discharge: 2023-02-16 | Disposition: A | Discharge status: Home / Self Care | Payer: Medicare Other | Source: Ambulatory Visit | Attending: Pulmonary Disease | Admitting: Pulmonary Disease

## 2023-02-16 VITALS — BP 144/64 | HR 66 | Ht 69.0 in | Wt 173.9 lb

## 2023-02-16 DIAGNOSIS — Z5189 Encounter for other specified aftercare: Secondary | ICD-10-CM | POA: Insufficient documentation

## 2023-02-16 DIAGNOSIS — J449 Chronic obstructive pulmonary disease, unspecified: Secondary | ICD-10-CM | POA: Diagnosis present

## 2023-02-16 DIAGNOSIS — L309 Dermatitis, unspecified: Secondary | ICD-10-CM | POA: Insufficient documentation

## 2023-02-16 HISTORY — DX: Hypothyroidism, unspecified: E03.9

## 2023-02-16 HISTORY — DX: Dermatitis, unspecified: L30.9

## 2023-02-16 HISTORY — DX: Unspecified urinary incontinence: R32

## 2023-02-16 HISTORY — DX: Hyperlipidemia, unspecified: E78.5

## 2023-02-16 NOTE — Progress Notes (Addendum)
Christine Moore 77 y.o. female Pulmonary Rehab Orientation Note  This patient who was referred to Pulmonary Rehab by Dr. Loanne Drilling with the diagnosis of COPD 2 arrived today in Cardiac and Pulmonary Rehab. She  arrived ambulatory with normal gait. She  does not carry portable oxygen.  Per patient, Christine Moore never uses oxygen. Color good, skin warm and dry. Patient is oriented to time and place. Patient's medical history, psychosocial health, and medications reviewed.   Psychosocial assessment reveals patient lives with alone. Christine Moore is currently retired. Patient hobbies include watching tv, spending time with others, and reading. Patient reports her stress Moore is high. Areas of stress/anxiety include  her sister's cancer that has metastasized and is "uncurable" . Patient does exhibit signs of depression. Signs of depression include anxiety and sadness and difficulty falling asleep. PHQ2/9 score 1/6. Christine Moore shows good  coping skills with positive outlook on life. Offered emotional support and reassurance. At this time Christine Moore has declined a referral to see a therapist/counselor. We will continue to monitor and evaluate progress toward psychosocial goal(s) of decreased stress and anxiety.   Physical assessment reveals patient is alert and oriented x 4.  Heart rate is normal, but per patient she goes in and out of Afib/Aflutter. Breath sounds diminished, moderate inspiratory wheezing heard both upper lobes with rhonchi, diminshed bases, with noted non-productive chronic cough. Bowel sounds present x4 quads. Pt denies abdominal discomfort, nausea, vomiting or diarrhea. Grip strength equal, strong. Distal pulses +2; no swelling noted to bilateral lower extremities.   Patient states she follows a regular diet. The patient has been trying to lose weight through a healthy diet and exercise program. Demonstration and practice of PLB using pulse oximeter. Christine Moore is able to return demonstration satisfactorily.  Safety and hand hygiene in the exercise area reviewed with patient. Christine Moore voices understanding of the information reviewed. Department expectations discussed with patient and achievable goals were set. The patient shows enthusiasm about attending the program and we look forward to working with Christine Moore. Christine Moore completed a 6 min walk test today and is scheduled to begin exercise on 02/24/2023 at 1015.   [id number] Christine Ores, RN, BSN

## 2023-02-16 NOTE — Progress Notes (Signed)
Pulmonary Individual Treatment Plan  Patient Details  Name: Christine Moore MRN: [id number] Date of Birth: 1946/08/31 Referring Provider:   April Manson Pulmonary Rehab Walk Test from 02/16/2023 in [facility] [place]  Referring Provider Loanne Drilling       Initial Encounter Date:  Flowsheet Row Pulmonary Rehab Walk Test from 02/16/2023 in [facility]  Date 02/16/23       Visit Diagnosis: Stage 2 moderate COPD by GOLD classification (Wilson)  Patient's Home Medications on Admission:   Current Outpatient Medications:    acyclovir (ZOVIRAX) 400 MG tablet, Take 400 mg by mouth 3 (three) times daily as needed (fever blisters)., Disp: , Rfl:    atorvastatin (LIPITOR) 10 MG tablet, Take 10 mg by mouth at bedtime., Disp: , Rfl:    chlorpheniramine (CHLOR-TRIMETON) 4 MG tablet, Take 4 mg by mouth See admin instructions. Take 4 mg twice daily, may increase to 4 times daily as needed for allergies, Disp: , Rfl:    Cholecalciferol (VITAMIN D) 50 MCG (2000 UT) tablet, Take 2,000 Units by mouth 2 (two) times daily., Disp: , Rfl:    clobetasol ointment (TEMOVATE) [id number] %, Apply 1 application  topically daily as needed (eczema)., Disp: , Rfl:    diltiazem (CARDIZEM CD) 240 MG 24 hr capsule, Take 1 capsule (240 mg total) by mouth daily., Disp: 90 capsule, Rfl: 3   ELIQUIS 5 MG TABS tablet, Take 1 tablet (5 mg total) by mouth 2 (two) times daily., Disp: 60 tablet, Rfl: 5   Fluticasone-Umeclidin-Vilant (TRELEGY ELLIPTA) 200-62.5-25 MCG/INH AEPB, Inhale 1 puff into the lungs daily., Disp: , Rfl:    hydrocortisone 2.5 % cream, Apply 1 Application topically daily as needed (facial eczema)., Disp: , Rfl:    Ketotifen Fumarate (ALAWAY OP), Place 1 drop into both eyes daily as needed (allergies)., Disp: , Rfl:    levalbuterol (XOPENEX HFA) 45 MCG/ACT inhaler, Inhale 2 puffs into the lungs 4 (four) times daily  as needed for shortness of breath or wheezing., Disp: 1 each, Rfl: 5   levalbuterol (XOPENEX) 0.63 MG/3ML nebulizer solution, Take 3 mLs (0.63 mg total) by nebulization every 6 (six) hours as needed for wheezing or shortness of breath., Disp: 360 mL, Rfl: 12   losartan (COZAAR) 50 MG tablet, Take 1 tablet (50 mg total) by mouth 2 (two) times daily., Disp: 180 tablet, Rfl: 2   Lutein-Zeaxanthin 25-5 MG CAPS, Take 1 capsule by mouth at bedtime., Disp: , Rfl:    montelukast (SINGULAIR) 10 MG tablet, Take 10 mg by mouth at bedtime., Disp: , Rfl:    naproxen sodium (ALEVE) 220 MG tablet, Take 220 mg by mouth daily as needed (headaches)., Disp: , Rfl:    omeprazole (PRILOSEC OTC) 20 MG tablet, Take 20 mg by mouth daily before breakfast., Disp: , Rfl:    Probiotic Product (PROBIOTIC-10 PO), Take 1 tablet by mouth daily., Disp: , Rfl:    sodium chloride (OCEAN) 0.65 % SOLN nasal spray, Place 1 spray into both nostrils as needed for congestion., Disp: , Rfl:    levothyroxine (SYNTHROID) 50 MCG tablet, Take 50 mcg by mouth daily., Disp: , Rfl:   Past Medical History: Past Medical History:  Diagnosis Date   Atrial fibrillation (HCC)    Atrial flutter (HCC)    COPD (chronic obstructive pulmonary disease) (HCC)    Eczema    Hyperlipidemia    Hypothyroid    Urinary  incontinence     Tobacco Use: Social History   Tobacco Use  Smoking Status Former   Packs/day: 1.00   Years: 22.00   Additional pack years: 0.00   Total pack years: 22.00   Types: Cigarettes   Start date: 1963   Quit date: 1985   Years since quitting: 39.2  Smokeless Tobacco Never    Labs: Review Flowsheet        No data to display          Capillary Blood Glucose: No results found for: "GLUCAP"   Pulmonary Assessment Scores:  Pulmonary Assessment Scores     Row Name 02/16/23 1101         ADL UCSD   SOB Score total 28       CAT Score   CAT Score 24       mMRC Score   mMRC Score 3              UCSD: Self-administered rating of dyspnea associated with activities of daily living (ADLs) 6-point scale (0 = "not at all" to 5 = "maximal or unable to do because of breathlessness")  Scoring Scores range from 0 to 120.  Minimally important difference is 5 units  CAT: CAT can identify the health impairment of COPD patients and is better correlated with disease progression.  CAT has a scoring range of zero to 40. The CAT score is classified into four groups of low (less than 10), medium (10 - 20), high (21-30) and very high (31-40) based on the impact level of disease on health status. A CAT score over 10 suggests significant symptoms.  A worsening CAT score could be explained by an exacerbation, poor medication adherence, poor inhaler technique, or progression of COPD or comorbid conditions.  CAT MCID is 2 points  mMRC: mMRC (Modified Medical Research Council) Dyspnea Scale is used to assess the degree of baseline functional disability in patients of respiratory disease due to dyspnea. No minimal important difference is established. A decrease in score of 1 point or greater is considered a positive change.   Pulmonary Function Assessment:  Pulmonary Function Assessment - 02/16/23 1054       Breath   Bilateral Breath Sounds Wheezes;Decreased;Rhonchi    Shortness of Breath Yes             Exercise Target Goals: Exercise Program Goal: Individual exercise prescription set using results from initial 6 min walk test and THRR while considering  patient's activity barriers and safety.   Exercise Prescription Goal: Initial exercise prescription builds to 30-45 minutes a day of aerobic activity, 2-3 days per week.  Home exercise guidelines will be given to patient during program as part of exercise prescription that the participant will acknowledge.  Activity Barriers & Risk Stratification:  Activity Barriers & Cardiac Risk Stratification - 02/16/23 1100       Activity Barriers &  Cardiac Risk Stratification   Activity Barriers Deconditioning;Muscular Weakness;Shortness of Breath;History of Falls;Back Problems             6 Minute Walk:  6 Minute Walk     Row Name 02/16/23 1146         6 Minute Walk   Phase Initial     Distance 940 feet     Walk Time 6 minutes     # of Rest Breaks 0     MPH 1.78     METS 2.15     RPE 7  Perceived Dyspnea  0     VO2 Peak 7.51     Symptoms Yes (comment)     Comments Productive cough     Resting HR 66 bpm     Resting BP 144/64     Resting Oxygen Saturation  95 %     Exercise Oxygen Saturation  during 6 min walk 94 %     Max Ex. HR 118 bpm     Max Ex. BP 136/62     2 Minute Post BP 120/58       Interval HR   1 Minute HR 66     2 Minute HR 99     3 Minute HR 102     4 Minute HR 119     5 Minute HR 104     6 Minute HR 118     2 Minute Post HR 69     Interval Heart Rate? Yes       Interval Oxygen   Interval Oxygen? Yes     Baseline Oxygen Saturation % 95 %     1 Minute Oxygen Saturation % 94 %     1 Minute Liters of Oxygen 0 L     2 Minute Oxygen Saturation % 94 %     2 Minute Liters of Oxygen 0 L     3 Minute Oxygen Saturation % 94 %     3 Minute Liters of Oxygen 0 L     4 Minute Oxygen Saturation % 95 %     4 Minute Liters of Oxygen 0 L     5 Minute Oxygen Saturation % 95 %     5 Minute Liters of Oxygen 0 L     6 Minute Oxygen Saturation % 95 %     6 Minute Liters of Oxygen 0 L     2 Minute Post Oxygen Saturation % 95 %     2 Minute Post Liters of Oxygen 0 L              Oxygen Initial Assessment:  Oxygen Initial Assessment - 02/16/23 1054       Home Oxygen   Home Oxygen Device None    Sleep Oxygen Prescription None    Home Exercise Oxygen Prescription None    Home Resting Oxygen Prescription None             Oxygen Re-Evaluation:   Oxygen Discharge (Final Oxygen Re-Evaluation):   Initial Exercise Prescription:  Initial Exercise Prescription - 02/16/23 1100        Date of Initial Exercise RX and Referring Provider   Date 02/16/23    Referring Provider Loanne Drilling    Expected Discharge Date 05/14/23      Elliptical   Level 1    Speed 1    Minutes 15      Track   Minutes 15    METs 2.15      Prescription Details   Frequency (times per week) 2    Duration Progress to 30 minutes of continuous aerobic without signs/symptoms of physical distress      Intensity   THRR 40-80% of Max Heartrate 56-113    Ratings of Perceived Exertion 11-13    Perceived Dyspnea 0-4      Progression   Progression Continue progressive overload as per policy without signs/symptoms or physical distress.      Resistance Training   Training Prescription Yes    Weight blue bands    Reps  10-15             Perform Capillary Blood Glucose checks as needed.  Exercise Prescription Changes:   Exercise Comments:   Exercise Goals and Review:   Exercise Goals     Row Name 02/16/23 1100             Exercise Goals   Increase Physical Activity Yes       Intervention Provide advice, education, support and counseling about physical activity/exercise needs.;Develop an individualized exercise prescription for aerobic and resistive training based on initial evaluation findings, risk stratification, comorbidities and participant's personal goals.       Expected Outcomes Short Term: Attend rehab on a regular basis to increase amount of physical activity.;Long Term: Exercising regularly at least 3-5 days a week.;Long Term: Add in home exercise to make exercise part of routine and to increase amount of physical activity.       Increase Strength and Stamina Yes       Intervention Provide advice, education, support and counseling about physical activity/exercise needs.;Develop an individualized exercise prescription for aerobic and resistive training based on initial evaluation findings, risk stratification, comorbidities and participant's personal goals.       Expected  Outcomes Short Term: Increase workloads from initial exercise prescription for resistance, speed, and METs.;Short Term: Perform resistance training exercises routinely during rehab and add in resistance training at home;Long Term: Improve cardiorespiratory fitness, muscular endurance and strength as measured by increased METs and functional capacity (6MWT)       Able to understand and use rate of perceived exertion (RPE) scale Yes       Intervention Provide education and explanation on how to use RPE scale       Expected Outcomes Short Term: Able to use RPE daily in rehab to express subjective intensity level;Long Term:  Able to use RPE to guide intensity level when exercising independently       Able to understand and use Dyspnea scale Yes       Intervention Provide education and explanation on how to use Dyspnea scale       Expected Outcomes Short Term: Able to use Dyspnea scale daily in rehab to express subjective sense of shortness of breath during exertion;Long Term: Able to use Dyspnea scale to guide intensity level when exercising independently       Knowledge and understanding of Target Heart Rate Range (THRR) Yes       Intervention Provide education and explanation of THRR including how the numbers were predicted and where they are located for reference       Expected Outcomes Short Term: Able to state/look up THRR;Short Term: Able to use daily as guideline for intensity in rehab;Long Term: Able to use THRR to govern intensity when exercising independently       Understanding of Exercise Prescription Yes       Intervention Provide education, explanation, and written materials on patient's individual exercise prescription       Expected Outcomes Short Term: Able to explain program exercise prescription;Long Term: Able to explain home exercise prescription to exercise independently                Exercise Goals Re-Evaluation :   Discharge Exercise Prescription (Final Exercise  Prescription Changes):   Nutrition:  Target Goals: Understanding of nutrition guidelines, daily intake of sodium 1500mg , cholesterol 200mg , calories 30% from fat and 7% or less from saturated fats, daily to have 5 or more servings of fruits and vegetables.  Biometrics:    Nutrition Therapy Plan and Nutrition Goals:   Nutrition Assessments:  MEDIFICTS Score Key: ?70 Need to make dietary changes  40-70 Heart Healthy Diet ? 40 Therapeutic Level Cholesterol Diet   Picture Your Plate Scores: [id number] Unhealthy dietary pattern with much room for improvement. 41-50 Dietary pattern unlikely to meet recommendations for good health and room for improvement. 51-60 More healthful dietary pattern, with some room for improvement.  >60 Healthy dietary pattern, although there may be some specific behaviors that could be improved.    Nutrition Goals Re-Evaluation:   Nutrition Goals Discharge (Final Nutrition Goals Re-Evaluation):   Psychosocial: Target Goals: Acknowledge presence or absence of significant depression and/or stress, maximize coping skills, provide positive support system. Participant is able to verbalize types and ability to use techniques and skills needed for reducing stress and depression.  Initial Review & Psychosocial Screening:  Initial Psych Review & Screening - 02/16/23 1106       Barriers   Psychosocial barriers to participate in program --      Screening Interventions   Interventions --             Quality of Life Scores:  Scores of 19 and below usually indicate a poorer quality of life in these areas.  A difference of  2-3 points is a clinically meaningful difference.  A difference of 2-3 points in the total score of the Quality of Life Index has been associated with significant improvement in overall quality of life, self-image, physical symptoms, and general health in studies assessing change in quality of life.  PHQ-9: Review Flowsheet        02/16/2023 09/23/2021  Depression screen PHQ 2/9  Decreased Interest 0 0  Down, Depressed, Hopeless 1 0  PHQ - 2 Score 1 0  Altered sleeping 1 1  Tired, decreased energy 1 3  Change in appetite 2 3  Feeling bad or failure about yourself  0 0  Trouble concentrating 1 1  Moving slowly or fidgety/restless 0 0  Suicidal thoughts 0 0  PHQ-9 Score 6 8  Difficult doing work/chores Somewhat difficult -   Interpretation of Total Score  Total Score Depression Severity:  1-4 = Minimal depression, 5-9 = Mild depression, 10-14 = Moderate depression, 15-19 = Moderately severe depression, 20-27 = Severe depression   Psychosocial Evaluation and Intervention:  Psychosocial Evaluation - 02/16/23 1056       Psychosocial Evaluation & Interventions   Interventions Relaxation education;Encouraged to exercise with the program and follow exercise prescription    Comments Pt is depressed and stressed about her sister's health. She stated she has a hard time sleeping because she is up worrying or thinking about things. She doesn't like to drive far distances, also concerned when she goes in/out of Afib and/or Aflutter    Expected Outcomes For Alyxia to participated in [place] without any psychosocial barriers or concerns    Continue Psychosocial Services  Follow up required by staff   Will continue and monitor Laurian for any psychosocial needs that may arise            Psychosocial Re-Evaluation:   Psychosocial Discharge (Final Psychosocial Re-Evaluation):   Education: Education Goals: Education classes will be provided on a weekly basis, covering required topics. Participant will state understanding/return demonstration of topics presented.  Learning Barriers/Preferences:  Learning Barriers/Preferences - 02/16/23 1056       Learning Barriers/Preferences   Learning Barriers Hearing;Sight    Learning Preferences Written Material;Group Instruction;Individual Instruction  Education Topics: Introduction to Pulmonary Rehab Group instruction provided by PowerPoint, verbal discussion, and written material to support subject matter. Instructor reviews what Pulmonary Rehab is, the purpose of the program, and how patients are referred.     Know Your Numbers Group instruction that is supported by a PowerPoint presentation. Instructor discusses importance of knowing and understanding resting, exercise, and post-exercise oxygen saturation, heart rate, and blood pressure. Oxygen saturation, heart rate, blood pressure, rating of perceived exertion, and dyspnea are reviewed along with a normal range for these values.    Exercise for the Pulmonary Patient Group instruction that is supported by a PowerPoint presentation. Instructor discusses benefits of exercise, core components of exercise, frequency, duration, and intensity of an exercise routine, importance of utilizing pulse oximetry during exercise, safety while exercising, and options of places to exercise outside of rehab.    MET Level  Group instruction provided by PowerPoint, verbal discussion, and written material to support subject matter. Instructor reviews what METs are and how to increase METs.    Pulmonary Medications Verbally interactive group education provided by instructor with focus on inhaled medications and proper administration.   Anatomy and Physiology of the Respiratory System Group instruction provided by PowerPoint, verbal discussion, and written material to support subject matter. Instructor reviews respiratory cycle and anatomical components of the respiratory system and their functions. Instructor also reviews differences in obstructive and restrictive respiratory diseases with examples of each.    Oxygen Safety Group instruction provided by PowerPoint, verbal discussion, and written material to support subject matter. There is an overview of "What is Oxygen" and "Why do we need it".   Instructor also reviews how to create a safe environment for oxygen use, the importance of using oxygen as prescribed, and the risks of noncompliance. There is a brief discussion on traveling with oxygen and resources the patient may utilize.   Oxygen Use Group instruction provided by PowerPoint, verbal discussion, and written material to discuss how supplemental oxygen is prescribed and different types of oxygen supply systems. Resources for more information are provided.    Breathing Techniques Group instruction that is supported by demonstration and informational handouts. Instructor discusses the benefits of pursed lip and diaphragmatic breathing and detailed demonstration on how to perform both.     Risk Factor Reduction Group instruction that is supported by a PowerPoint presentation. Instructor discusses the definition of a risk factor, different risk factors for pulmonary disease, and how the heart and lungs work together.   MD Day A group question and answer session with a medical doctor that allows participants to ask questions that relate to their pulmonary disease state.   Nutrition for the Pulmonary Patient Group instruction provided by PowerPoint slides, verbal discussion, and written materials to support subject matter. The instructor gives an explanation and review of healthy diet recommendations, which includes a discussion on weight management, recommendations for fruit and vegetable consumption, as well as protein, fluid, caffeine, fiber, sodium, sugar, and alcohol. Tips for eating when patients are short of breath are discussed.    Other Education Group or individual verbal, written, or video instructions that support the educational goals of the pulmonary rehab program.    Knowledge Questionnaire Score:  Knowledge Questionnaire Score - 02/16/23 1132       Knowledge Questionnaire Score   Pre Score 17/18             Core Components/Risk Factors/Patient Goals  at Admission:  Personal Goals and Risk Factors at Admission - 02/16/23 1057  Core Components/Risk Factors/Patient Goals on Admission    Weight Management Weight Loss    Improve shortness of breath with ADL's Yes    Intervention Provide education, individualized exercise plan and daily activity instruction to help decrease symptoms of SOB with activities of daily living.    Expected Outcomes Short Term: Improve cardiorespiratory fitness to achieve a reduction of symptoms when performing ADLs;Long Term: Be able to perform more ADLs without symptoms or delay the onset of symptoms    Increase knowledge of respiratory medications and ability to use respiratory devices properly  Yes    Intervention Provide education and demonstration as needed of appropriate use of medications, inhalers, and oxygen therapy.    Expected Outcomes Short Term: Achieves understanding of medications use. Understands that oxygen is a medication prescribed by physician. Demonstrates appropriate use of inhaler and oxygen therapy.;Long Term: Maintain appropriate use of medications, inhalers, and oxygen therapy.    Stress Yes    Intervention Offer individual and/or small group education and counseling on adjustment to heart disease, stress management and health-related lifestyle change. Teach and support self-help strategies.;Refer participants experiencing significant psychosocial distress to appropriate mental health specialists for further evaluation and treatment. When possible, include family members and significant others in education/counseling sessions.    Expected Outcomes Short Term: Participant demonstrates changes in health-related behavior, relaxation and other stress management skills, ability to obtain effective social support, and compliance with psychotropic medications if prescribed.;Long Term: Emotional wellbeing is indicated by absence of clinically significant psychosocial distress or social isolation.              Core Components/Risk Factors/Patient Goals Review:    Core Components/Risk Factors/Patient Goals at Discharge (Final Review):    ITP Comments:   Comments: Dr. Rodman Pickle is Medical Director for Pulmonary Rehab at [facility].

## 2023-02-16 NOTE — Progress Notes (Addendum)
Christine Moore 77 y.o. female  Initial Psychosocial Assessment  Pt psychosocial assessment reveals pt lives alone. Pt is currently retired. Pt hobbies include watching tv, spending time with others, and reading. Pt reports her stress level is moderate. Areas of stress/anxiety include her sister who has metastatic cancer that she says is "uncurable". Pt does exhibit signs of depression. Signs of depression include anxiety and sadness and difficulty falling asleep. Currently, patient declines a referral to see a counselor/therapist. Pt shows fair  coping skills with positive outlook. Offered emotional support and reassurance. We will continue to monitor and evaluate progress toward her psychosocial goals.  Goals: Improved management of stress, anxiety, and depression Improved coping skills Help patient work toward returning to meaningful activities that improve patient's QOL and are attainable with patient's lung disease   02/16/2023 11:46 AM

## 2023-02-24 ENCOUNTER — Encounter (HOSPITAL_COMMUNITY)
Admission: RE | Admit: 2023-02-24 | Discharge: 2023-02-24 | Disposition: A | Discharge status: Home / Self Care | Payer: Medicare Other | Source: Ambulatory Visit | Attending: Pulmonary Disease | Admitting: Pulmonary Disease

## 2023-02-24 DIAGNOSIS — J449 Chronic obstructive pulmonary disease, unspecified: Secondary | ICD-10-CM

## 2023-02-24 NOTE — Progress Notes (Signed)
Daily Session Note  Patient Details  Name: Christine Moore MRN: [id number] Date of Birth: 1945-12-14 Referring Provider:   April Manson Pulmonary Rehab Walk Test from 02/16/2023 in [facility], [facility], & [facility]  Referring Provider Loanne Drilling       Encounter Date: 02/24/2023  Check In:  Session Check In - 02/24/23 1205       Check-In   Supervising physician immediately available to respond to emergencies [facility] MD immediately available    Physician(s) Nevada Crane, PA    Location [facility]    Staff Present Janine Ores, RN, Quentin Ore, MS, ACSM-CEP, Exercise Physiologist;Randi Yevonne Pax, ACSM-CEP, Exercise Physiologist;Casey Tamala Julian, RT    Virtual Visit No    Medication changes reported     No    Fall or balance concerns reported    No    Tobacco Cessation No Change    Warm-up and Cool-down Performed as group-led instruction    Resistance Training Performed No    VAD Patient? No    PAD/SET Patient? No      Pain Assessment   Currently in Pain? No/denies    Multiple Pain Sites No             Capillary Blood Glucose: No results found for this or any previous visit (from the past 24 hour(s)).    Social History   Tobacco Use  Smoking Status Former   Packs/day: 1.00   Years: 22.00   Additional pack years: 0.00   Total pack years: 22.00   Types: Cigarettes   Start date: 1963   Quit date: 1985   Years since quitting: 39.2  Smokeless Tobacco Never    Goals Met:  Proper associated with RPD/PD & O2 Sat Exercise tolerated well No report of concerns or symptoms today Strength training completed today  Goals Unmet:  Not Applicable  Comments: Service time is from 1016 to 1145.    Dr. Rodman Pickle is Medical Director for Pulmonary Rehab at [facility].

## 2023-02-26 ENCOUNTER — Encounter (HOSPITAL_COMMUNITY)
Admission: RE | Admit: 2023-02-26 | Discharge: 2023-02-26 | Disposition: A | Discharge status: Home / Self Care | Payer: Medicare Other | Source: Ambulatory Visit | Attending: Pulmonary Disease | Admitting: Pulmonary Disease

## 2023-02-26 DIAGNOSIS — J449 Chronic obstructive pulmonary disease, unspecified: Secondary | ICD-10-CM

## 2023-02-26 NOTE — Progress Notes (Signed)
Daily Session Note  Patient Details  Name: Christine Moore MRN: [id number] Date of Birth: Feb 26, 1946 Referring Provider:   April Manson Pulmonary Rehab Walk Test from 02/16/2023 in [facility] for Heart, Vascular, & [place]  Referring Provider Loanne Drilling       Encounter Date: 02/26/2023  Check In:  Session Check In - 02/26/23 1134       Check-In   Supervising physician immediately available to respond to emergencies [facility] immediately available    Physician(s) Gala Romney    [facility]    Staff Present Janine Ores, RN, Quentin Ore, MS, ACSM-CEP, Exercise Physiologist;Randi Yevonne Pax, ACSM-CEP, Exercise Physiologist;Rael Yo Benjamine Mola, MS, Exercise Physiologist;Johnny Starleen Blue, MS, Exercise Physiologist    Virtual Visit No    Medication changes reported     No    Fall or balance concerns reported    No    Tobacco Cessation No Change    Warm-up and Cool-down Performed as group-led instruction    Resistance Training Performed Yes    VAD Patient? No    PAD/SET Patient? No      Pain Assessment   Currently in Pain? No/denies    Multiple Pain Sites No             Capillary Blood Glucose: No results found for this or any previous visit (from the past 24 hour(s)).    Social History   Tobacco Use  Smoking Status Former   Packs/day: 1.00   Years: 22.00   Additional pack years: 0.00   Total pack years: 22.00   Types: Cigarettes   Start date: 1963   Quit date: 1985   Years since quitting: 39.2  Smokeless Tobacco Never    Goals Met:  Proper associated with RPD/PD & O2 Sat Independence with exercise equipment Exercise tolerated well No report of concerns or symptoms today Strength training completed today  Goals Unmet:  Not Applicable  Comments: Service time is from 1004 to 1140.    Dr. Rodman Pickle is Medical Director for Pulmonary Rehab at [facility].

## 2023-03-03 ENCOUNTER — Encounter (HOSPITAL_COMMUNITY)
Admission: RE | Admit: 2023-03-03 | Discharge: 2023-03-03 | Disposition: A | Discharge status: Home / Self Care | Payer: Medicare Other | Source: Ambulatory Visit | Attending: Pulmonary Disease | Admitting: Pulmonary Disease

## 2023-03-03 VITALS — Wt 171.5 lb

## 2023-03-03 DIAGNOSIS — J449 Chronic obstructive pulmonary disease, unspecified: Secondary | ICD-10-CM | POA: Diagnosis present

## 2023-03-03 NOTE — Progress Notes (Signed)
Daily Session Note  Patient Details  Name: Christine Moore MRN: [id number] Date of Birth: 1946-04-14 Referring Provider:   April Manson Pulmonary Rehab Walk Test from 02/16/2023 in [facility] for Heart, Vascular, & [place]  Referring Provider Loanne Drilling       Encounter Date: 03/03/2023  Check In:  Session Check In - 03/03/23 1033       Check-In   Supervising physician immediately available to respond to emergencies [facility] immediately available    Physician(s) Ambrose Pancoast, NP    [facility]    Staff Present Elmon Else, MS, ACSM-CEP, Exercise Physiologist;Randi Yevonne Pax, ACSM-CEP, Exercise Physiologist;Charlann Wayne Lazarus Salines, RN, BSN    Virtual Visit No    Medication changes reported     No    Fall or balance concerns reported    No    Tobacco Cessation No Change    Warm-up and Cool-down Performed as group-led instruction    Resistance Training Performed Yes    VAD Patient? No    PAD/SET Patient? No      Pain Assessment   Currently in Pain? No/denies    Multiple Pain Sites No             Capillary Blood Glucose: No results found for this or any previous visit (from the past 24 hour(s)).   Exercise Prescription Changes - 03/03/23 1500       Response to Exercise   Blood Pressure (Admit) 142/68    Blood Pressure (Exercise) 152/70    Blood Pressure (Exit) 130/62    Heart Rate (Admit) 69 bpm    Heart Rate (Exercise) 85 bpm    Heart Rate (Exit) 70 bpm    Oxygen Saturation (Admit) 93 %    Oxygen Saturation (Exercise) 95 %    Oxygen Saturation (Exit) 92 %    Rating of Perceived Exertion (Exercise) 13    Perceived Dyspnea (Exercise) 2    Duration Continue with 30 min of aerobic exercise without signs/symptoms of physical distress.    Intensity THRR unchanged      Resistance Training   Training Prescription Yes    Weight blue bands    Reps 10-15    Time 10 Minutes      Treadmill   MPH 2    Grade  0    Minutes 15    METs 2.53      Recumbant Elliptical   Level 2    Minutes 15    METs 3.8             Social History   Tobacco Use  Smoking Status Former   Packs/day: 1.00   Years: 22.00   Additional pack years: 0.00   Total pack years: 22.00   Types: Cigarettes   Start date: 1963   Quit date: 1985   Years since quitting: 39.2  Smokeless Tobacco Never    Goals Met:  Proper associated with RPD/PD & O2 Sat Independence with exercise equipment Exercise tolerated well No report of concerns or symptoms today Strength training completed today  Goals Unmet:  Not Applicable  Comments: Service time is from 1000 to 1440.    Dr. Rodman Pickle is Medical Director for Pulmonary Rehab at [facility].

## 2023-03-05 ENCOUNTER — Encounter (HOSPITAL_COMMUNITY)
Admission: RE | Admit: 2023-03-05 | Discharge: 2023-03-05 | Disposition: A | Discharge status: Home / Self Care | Payer: Medicare Other | Source: Ambulatory Visit | Attending: Pulmonary Disease | Admitting: Pulmonary Disease

## 2023-03-05 DIAGNOSIS — J449 Chronic obstructive pulmonary disease, unspecified: Secondary | ICD-10-CM

## 2023-03-05 NOTE — Progress Notes (Signed)
Daily Session Note  Patient Details  Name: Christine Moore MRN: [id number] Date of Birth: 1946-05-26 Referring Provider:   April Manson Pulmonary Rehab Walk Test from 02/16/2023 in [facility], V[facility], & [facility]  Referring Provider Loanne Drilling       Encounter Date: 03/05/2023  Check In:  Session Check In - 03/05/23 1141       Check-In   Supervising physician immediately available to respond to emergencies [facility] immediately available    Physician(s) Odie Sera, NP    [facility]    Staff Present Samantha [place], RD, Perlie Mayo, RN, BSN;Randi Reeve BS, ACSM-CEP, Exercise Physiologist;Jetta Gilford Rile BS, ACSM-CEP, Exercise Physiologist;Kaylee Rosana Hoes, MS, ACSM-CEP, Exercise Physiologist;Chaye Misch Tamala Julian, RT    Virtual Visit No    Medication changes reported     No    Fall or balance concerns reported    No    Tobacco Cessation No Change    Warm-up and Cool-down Performed as group-led instruction    Resistance Training Performed Yes    VAD Patient? No    PAD/SET Patient? No      Pain Assessment   Currently in Pain? No/denies    Multiple Pain Sites No             Capillary Blood Glucose: No results found for this or any previous visit (from the past 24 hour(s)).    Social History   Tobacco Use  Smoking Status Former   Packs/day: 1.00   Years: 22.00   Additional pack years: 0.00   Total pack years: 22.00   Types: Cigarettes   Start date: 1963   Quit date: 1985   Years since quitting: 39.2  Smokeless Tobacco Never    Goals Met:  Proper associated with RPD/PD & O2 Sat Independence with exercise equipment Exercise tolerated well No report of concerns or symptoms today Strength training completed today  Goals Unmet:  Not Applicable  Comments: Service time is from 1008 to 1140.    Dr. Rodman Pickle is Medical Director for Pulmonary Rehab at [facility].

## 2023-03-10 ENCOUNTER — Encounter (HOSPITAL_COMMUNITY)
Admission: RE | Admit: 2023-03-10 | Discharge: 2023-03-10 | Disposition: A | Discharge status: Home / Self Care | Payer: Medicare Other | Source: Ambulatory Visit | Attending: Pulmonary Disease | Admitting: Pulmonary Disease

## 2023-03-10 DIAGNOSIS — J449 Chronic obstructive pulmonary disease, unspecified: Secondary | ICD-10-CM

## 2023-03-10 NOTE — Progress Notes (Signed)
Daily Session Note  Patient Details  Name: Christine Moore MRN: [id number] Date of Birth: 08/07/1946 Referring Provider:   [name] Pulmonary Rehab Walk Test from 02/16/2023 in [facility], Vascular, & [facility]  Referring Provider [name]       Encounter Date: 03/10/2023  Check In:  Session Check In - 03/10/23 1201       Check-In   Supervising physician immediately available to respond to emergencies [facility] immediately available    Physician(s) Oris Drone, NP    [facility]    Staff Present Samantha Bel[place], RD, Dutch Gray, RN, BSN;Randi Reeve BS, ACSM-CEP, Exercise Physiologist;Kaylee Earlene Plater, MS, ACSM-CEP, Exercise Physiologist;Keisy Strickler Marcille Buffy, RN, BSN    Virtual Visit No    Medication changes reported     No    Fall or balance concerns reported    No    Tobacco Cessation No Change    Warm-up and Cool-down Performed as group-led instruction    Resistance Training Performed Yes    VAD Patient? No    PAD/SET Patient? No      Pain Assessment   Currently in Pain? No/denies    Multiple Pain Sites No             Capillary Blood Glucose: No results found for this or any previous visit (from the past 24 hour(s)).    Social History   Tobacco Use  Smoking Status Former   Packs/day: 1.00   Years: 22.00   Additional pack years: 0.00   Total pack years: 22.00   Types: Cigarettes   Start date: 1963   Quit date: 1985   Years since quitting: 39.2  Smokeless Tobacco Never    Goals Met:  Proper associated with RPD/PD & O2 Sat Independence with exercise equipment Exercise tolerated well No report of concerns or symptoms today Strength training completed today  Goals Unmet:  Not Applicable  Comments: Service time is from 1003 to 1140.    Dr. Mechele Collin is Medical Director for Pulmonary Rehab at [facility].

## 2023-03-11 NOTE — Progress Notes (Signed)
Pulmonary Individual Treatment Plan  Patient Details  Name: OTHA Moore MRN: [id number] Date of Birth: 02/19/1946 Referring Provider:   [name] Pulmonary Rehab Walk Test from 02/16/2023 in [facility]  Referring Provider [name]       Initial Encounter Date:  Flowsheet Row Pulmonary Rehab Walk Test from 02/16/2023 in [facility], [facility]  Date 02/16/23       Visit Diagnosis: Stage 2 moderate COPD by GOLD classification  Patient's Home Medications on Admission:   Current Outpatient Medications:    acyclovir (ZOVIRAX) 400 MG tablet, Take 400 mg by mouth 3 (three) times daily as needed (fever blisters)., Disp: , Rfl:    atorvastatin (LIPITOR) 10 MG tablet, Take 10 mg by mouth at bedtime., Disp: , Rfl:    chlorpheniramine (CHLOR-TRIMETON) 4 MG tablet, Take 4 mg by mouth See admin instructions. Take 4 mg twice daily, may increase to 4 times daily as needed for allergies, Disp: , Rfl:    Cholecalciferol (VITAMIN D) 50 MCG (2000 UT) tablet, Take 2,000 Units by mouth 2 (two) times daily., Disp: , Rfl:    clobetasol ointment (TEMOVATE) 0.05 %, Apply 1 application  topically daily as needed (eczema)., Disp: , Rfl:    diltiazem (CARDIZEM CD) 240 MG 24 hr capsule, Take 1 capsule (240 mg total) by mouth daily., Disp: 90 capsule, Rfl: 3   ELIQUIS 5 MG TABS tablet, Take 1 tablet (5 mg total) by mouth 2 (two) times daily., Disp: 60 tablet, Rfl: 5   Fluticasone-Umeclidin-Vilant (TRELEGY ELLIPTA) 200-62.5-25 MCG/INH AEPB, Inhale 1 puff into the lungs daily., Disp: , Rfl:    hydrocortisone 2.5 % cream, Apply 1 Application topically daily as needed (facial eczema)., Disp: , Rfl:    Ketotifen Fumarate (ALAWAY OP), Place 1 drop into both eyes daily as needed (allergies)., Disp: , Rfl:    levalbuterol (XOPENEX HFA) 45 MCG/ACT inhaler, Inhale 2 puffs into the lungs 4 (four) times daily as  needed for shortness of breath or wheezing., Disp: 1 each, Rfl: 5   levalbuterol (XOPENEX) 0.63 MG/3ML nebulizer solution, Take 3 mLs (0.63 mg total) by nebulization every 6 (six) hours as needed for wheezing or shortness of breath., Disp: 360 mL, Rfl: 12   levothyroxine (SYNTHROID) 50 MCG tablet, Take 50 mcg by mouth daily., Disp: , Rfl:    losartan (COZAAR) 50 MG tablet, Take 1 tablet (50 mg total) by mouth 2 (two) times daily., Disp: 180 tablet, Rfl: 2   Lutein-Zeaxanthin 25-5 MG CAPS, Take 1 capsule by mouth at bedtime., Disp: , Rfl:    montelukast (SINGULAIR) 10 MG tablet, Take 10 mg by mouth at bedtime., Disp: , Rfl:    naproxen sodium (ALEVE) 220 MG tablet, Take 220 mg by mouth daily as needed (headaches)., Disp: , Rfl:    omeprazole (PRILOSEC OTC) 20 MG tablet, Take 20 mg by mouth daily before breakfast., Disp: , Rfl:    Probiotic Product (PROBIOTIC-10 PO), Take 1 tablet by mouth daily., Disp: , Rfl:    sodium chloride (OCEAN) 0.65 % SOLN nasal spray, Place 1 spray into both nostrils as needed for congestion., Disp: , Rfl:   Past Medical History: Past Medical History:  Diagnosis Date   Atrial fibrillation (HCC)    Atrial flutter (HCC)    COPD (chronic obstructive pulmonary disease) (HCC)    Eczema    Hyperlipidemia    Hypothyroid    Urinary incontinence  Tobacco Use: Social History   Tobacco Use  Smoking Status Former   Packs/day: 1.00   Years: 22.00   Additional pack years: 0.00   Total pack years: 22.00   Types: Cigarettes   Start date: 1963   Quit date: 1985   Years since quitting: 39.2  Smokeless Tobacco Never    Labs: Review Flowsheet        No data to display          Capillary Blood Glucose: No results found for: "GLUCAP"   Pulmonary Assessment Scores:  Pulmonary Assessment Scores     Row Name 02/16/23 1101         ADL UCSD   SOB Score total 28       CAT Score   CAT Score 24       mMRC Score   mMRC Score 3              UCSD: Self-administered rating of dyspnea associated with activities of daily living (ADLs) 6-point scale (0 = "not at all" to 5 = "maximal or unable to do because of breathlessness")  Scoring Scores range from 0 to 120.  Minimally important difference is 5 units  CAT: CAT can identify the health impairment of COPD patients and is better correlated with disease progression.  CAT has a scoring range of zero to 40. The CAT score is classified into four groups of low (less than 10), medium (10 - 20), high (21-30) and very high (31-40) based on the impact level of disease on health status. A CAT score over 10 suggests significant symptoms.  A worsening CAT score could be explained by an exacerbation, poor medication adherence, poor inhaler technique, or progression of COPD or comorbid conditions.  CAT MCID is 2 points  mMRC: mMRC (Modified Medical Research Council) Dyspnea Scale is used to assess the degree of baseline functional disability in patients of respiratory disease due to dyspnea. No minimal important difference is established. A decrease in score of 1 point or greater is considered a positive change.   Pulmonary Function Assessment:  Pulmonary Function Assessment - 02/16/23 1054       Breath   Bilateral Breath Sounds Wheezes;Decreased;Rhonchi    Shortness of Breath Yes             Exercise Target Goals: Exercise Program Goal: Individual exercise prescription set using results from initial 6 min walk test and THRR while considering  patient's activity barriers and safety.   Exercise Prescription Goal: Initial exercise prescription builds to 30-45 minutes a day of aerobic activity, 2-3 days per week.  Home exercise guidelines will be given to patient during program as part of exercise prescription that the participant will acknowledge.  Activity Barriers & Risk Stratification:  Activity Barriers & Cardiac Risk Stratification - 02/16/23 1100       Activity Barriers &  Cardiac Risk Stratification   Activity Barriers Deconditioning;Muscular Weakness;Shortness of Breath;History of Falls;Back Problems             6 Minute Walk:  6 Minute Walk     Row Name 02/16/23 1146         6 Minute Walk   Phase Initial     Distance 940 feet     Walk Time 6 minutes     # of Rest Breaks 0     MPH 1.78     METS 2.15     RPE 7     Perceived Dyspnea  0  VO2 Peak 7.51     Symptoms Yes (comment)     Comments Productive cough     Resting HR 66 bpm     Resting BP 144/64     Resting Oxygen Saturation  95 %     Exercise Oxygen Saturation  during 6 min walk 94 %     Max Ex. HR 118 bpm     Max Ex. BP 136/62     2 Minute Post BP 120/58       Interval HR   1 Minute HR 66     2 Minute HR 99     3 Minute HR 102     4 Minute HR 119     5 Minute HR 104     6 Minute HR 118     2 Minute Post HR 69     Interval Heart Rate? Yes       Interval Oxygen   Interval Oxygen? Yes     Baseline Oxygen Saturation % 95 %     1 Minute Oxygen Saturation % 94 %     1 Minute Liters of Oxygen 0 L     2 Minute Oxygen Saturation % 94 %     2 Minute Liters of Oxygen 0 L     3 Minute Oxygen Saturation % 94 %     3 Minute Liters of Oxygen 0 L     4 Minute Oxygen Saturation % 95 %     4 Minute Liters of Oxygen 0 L     5 Minute Oxygen Saturation % 95 %     5 Minute Liters of Oxygen 0 L     6 Minute Oxygen Saturation % 95 %     6 Minute Liters of Oxygen 0 L     2 Minute Post Oxygen Saturation % 95 %     2 Minute Post Liters of Oxygen 0 L              Oxygen Initial Assessment:  Oxygen Initial Assessment - 02/16/23 1054       Home Oxygen   Home Oxygen Device None    Sleep Oxygen Prescription None    Home Exercise Oxygen Prescription None    Home Resting Oxygen Prescription None             Oxygen Re-Evaluation:  Oxygen Re-Evaluation     Row Name 03/03/23 0909             Program Oxygen Prescription   Program Oxygen Prescription None         Home  Oxygen   Home Oxygen Device None       Sleep Oxygen Prescription None       Home Exercise Oxygen Prescription None       Home Resting Oxygen Prescription None       Compliance with Home Oxygen Use No         Goals/Expected Outcomes   Short Term Goals To learn and exhibit compliance with exercise, home and travel O2 prescription;To learn and understand importance of maintaining oxygen saturations>88%;To learn and demonstrate proper use of respiratory medications;To learn and demonstrate proper pursed lip breathing techniques or other breathing techniques. ;To learn and understand importance of monitoring SPO2 with pulse oximeter and demonstrate accurate use of the pulse oximeter.       Long  Term Goals Exhibits compliance with exercise, home  and travel O2 prescription;Verbalizes importance of monitoring SPO2 with pulse oximeter  and return demonstration;Maintenance of O2 saturations>88%;Exhibits proper breathing techniques, such as pursed lip breathing or other method taught during program session;Compliance with respiratory medication;Demonstrates proper use of MDI's       Goals/Expected Outcomes Compliance and understanding of oxygen saturation monitoring and breathing techniques to decrease shortness of breath.                Oxygen Discharge (Final Oxygen Re-Evaluation):  Oxygen Re-Evaluation - 03/03/23 0909       Program Oxygen Prescription   Program Oxygen Prescription None      Home Oxygen   Home Oxygen Device None    Sleep Oxygen Prescription None    Home Exercise Oxygen Prescription None    Home Resting Oxygen Prescription None    Compliance with Home Oxygen Use No      Goals/Expected Outcomes   Short Term Goals To learn and exhibit compliance with exercise, home and travel O2 prescription;To learn and understand importance of maintaining oxygen saturations>88%;To learn and demonstrate proper use of respiratory medications;To learn and demonstrate proper pursed lip breathing  techniques or other breathing techniques. ;To learn and understand importance of monitoring SPO2 with pulse oximeter and demonstrate accurate use of the pulse oximeter.    Long  Term Goals Exhibits compliance with exercise, home  and travel O2 prescription;Verbalizes importance of monitoring SPO2 with pulse oximeter and return demonstration;Maintenance of O2 saturations>88%;Exhibits proper breathing techniques, such as pursed lip breathing or other method taught during program session;Compliance with respiratory medication;Demonstrates proper use of MDI's    Goals/Expected Outcomes Compliance and understanding of oxygen saturation monitoring and breathing techniques to decrease shortness of breath.             Initial Exercise Prescription:  Initial Exercise Prescription - 02/16/23 1100       Date of Initial Exercise RX and Referring Provider   Date 02/16/23    Referring Provider [name]    Expected Discharge Date 05/14/23      Elliptical   Level 1    Speed 1    Minutes 15      Track   Minutes 15    METs 2.15      Prescription Details   Frequency (times per week) 2    Duration Progress to 30 minutes of continuous aerobic without signs/symptoms of physical distress      Intensity   THRR 40-80% of Max Heartrate 56-113    Ratings of Perceived Exertion 11-13    Perceived Dyspnea 0-4      Progression   Progression Continue progressive overload as per policy without signs/symptoms or physical distress.      Resistance Training   Training Prescription Yes    Weight blue bands    Reps 10-15             Perform Capillary Blood Glucose checks as needed.  Exercise Prescription Changes:   Exercise Prescription Changes     Row Name 03/03/23 1500             Response to Exercise   Blood Pressure (Admit) 142/68       Blood Pressure (Exercise) 152/70       Blood Pressure (Exit) 130/62       Heart Rate (Admit) 69 bpm       Heart Rate (Exercise) 85 bpm       Heart  Rate (Exit) 70 bpm       Oxygen Saturation (Admit) 93 %       Oxygen Saturation (  Exercise) 95 %       Oxygen Saturation (Exit) 92 %       Rating of Perceived Exertion (Exercise) 13       Perceived Dyspnea (Exercise) 2       Duration Continue with 30 min of aerobic exercise without signs/symptoms of physical distress.       Intensity THRR unchanged         Resistance Training   Training Prescription Yes       Weight blue bands       Reps 10-15       Time 10 Minutes         Treadmill   MPH 2       Grade 0       Minutes 15       METs 2.53         Recumbant Elliptical   Level 2       Minutes 15       METs 3.8                Exercise Comments:   Exercise Comments     Row Name 02/24/23 1544           Exercise Comments Pt completed first day of exercise. Tayana exercised for 15 min on the recumbent elliptical and 8 min on the track. She averaged 2.8 METs at level 1 on the recumbent elliptical and 1.77 METs on the track. Sheccid performed the warmup and cooldown standing without limitations. Discussed METs.                Exercise Goals and Review:   Exercise Goals     Row Name 02/16/23 1100 03/03/23 0907           Exercise Goals   Increase Physical Activity Yes Yes      Intervention Provide advice, education, support and counseling about physical activity/exercise needs.;Develop an individualized exercise prescription for aerobic and resistive training based on initial evaluation findings, risk stratification, comorbidities and participant's personal goals. Provide advice, education, support and counseling about physical activity/exercise needs.;Develop an individualized exercise prescription for aerobic and resistive training based on initial evaluation findings, risk stratification, comorbidities and participant's personal goals.      Expected Outcomes Short Term: Attend rehab on a regular basis to increase amount of physical activity.;Long Term: Exercising  regularly at least 3-5 days a week.;Long Term: Add in home exercise to make exercise part of routine and to increase amount of physical activity. Short Term: Attend rehab on a regular basis to increase amount of physical activity.;Long Term: Exercising regularly at least 3-5 days a week.;Long Term: Add in home exercise to make exercise part of routine and to increase amount of physical activity.      Increase Strength and Stamina Yes Yes      Intervention Provide advice, education, support and counseling about physical activity/exercise needs.;Develop an individualized exercise prescription for aerobic and resistive training based on initial evaluation findings, risk stratification, comorbidities and participant's personal goals. Provide advice, education, support and counseling about physical activity/exercise needs.;Develop an individualized exercise prescription for aerobic and resistive training based on initial evaluation findings, risk stratification, comorbidities and participant's personal goals.      Expected Outcomes Short Term: Increase workloads from initial exercise prescription for resistance, speed, and METs.;Short Term: Perform resistance training exercises routinely during rehab and add in resistance training at home;Long Term: Improve cardiorespiratory fitness, muscular endurance and strength as measured by increased METs and  functional capacity ( ) Short Term: Increase workloads from initial exercise prescription for resistance, speed, and METs.;Short Term: Perform resistance training exercises routinely during rehab and add in resistance training at home;Long Term: Improve cardiorespiratory fitness, muscular endurance and strength as measured by increased METs and functional capacity ( )      Able to understand and use rate of perceived exertion (RPE) scale Yes Yes      Intervention Provide education and explanation on how to use RPE scale Provide education and explanation on how to  use RPE scale      Expected Outcomes Short Term: Able to use RPE daily in rehab to express subjective intensity level;Long Term:  Able to use RPE to guide intensity level when exercising independently Short Term: Able to use RPE daily in rehab to express subjective intensity level;Long Term:  Able to use RPE to guide intensity level when exercising independently      Able to understand and use Dyspnea scale Yes Yes      Intervention Provide education and explanation on how to use Dyspnea scale Provide education and explanation on how to use Dyspnea scale      Expected Outcomes Short Term: Able to use Dyspnea scale daily in rehab to express subjective sense of shortness of breath during exertion;Long Term: Able to use Dyspnea scale to guide intensity level when exercising independently Short Term: Able to use Dyspnea scale daily in rehab to express subjective sense of shortness of breath during exertion;Long Term: Able to use Dyspnea scale to guide intensity level when exercising independently      Knowledge and understanding of Target Heart Rate Range (THRR) Yes Yes      Intervention Provide education and explanation of THRR including how the numbers were predicted and where they are located for reference Provide education and explanation of THRR including how the numbers were predicted and where they are located for reference      Expected Outcomes Short Term: Able to state/look up THRR;Short Term: Able to use daily as guideline for intensity in rehab;Long Term: Able to use THRR to govern intensity when exercising independently Short Term: Able to state/look up THRR;Short Term: Able to use daily as guideline for intensity in rehab;Long Term: Able to use THRR to govern intensity when exercising independently      Understanding of Exercise Prescription Yes Yes      Intervention Provide education, explanation, and written materials on patient's individual exercise prescription Provide education, explanation,  and written materials on patient's individual exercise prescription      Expected Outcomes Short Term: Able to explain program exercise prescription;Long Term: Able to explain home exercise prescription to exercise independently Short Term: Able to explain program exercise prescription;Long Term: Able to explain home exercise prescription to exercise independently               Exercise Goals Re-Evaluation :  Exercise Goals Re-Evaluation     Row Name 03/03/23 0907             Exercise Goal Re-Evaluation   Exercise Goals Review Increase Physical Activity;Able to understand and use Dyspnea scale;Understanding of Exercise Prescription;Increase Strength and Stamina;Knowledge and understanding of Target Heart Rate Range (THRR);Able to understand and use rate of perceived exertion (RPE) scale       Comments Kennede has completed 3 exercise sessions. She exercises for 15 min on the recumbent elliptical and treadmill. Julie-Ann averages 3.8 METs at level 1 on the recumbent elliptical and 2.53 METs on the treadmill. She  performs the warmup and cooldown standing without limitations. Tedi has recently increased her workload for both exercise modes as she tolerates progressions well. She seems to enjoy rehab as she converses with other patients and has a positive attitude. Will continue to monitor and progress as able.       Expected Outcomes Through exercise at rehab and at home, the patient will decrease shortness of breath with daily activities and feel confident in carrying out an exercise regime at home.                Discharge Exercise Prescription (Final Exercise Prescription Changes):  Exercise Prescription Changes - 03/03/23 1500       Response to Exercise   Blood Pressure (Admit) 142/68    Blood Pressure (Exercise) 152/70    Blood Pressure (Exit) 130/62    Heart Rate (Admit) 69 bpm    Heart Rate (Exercise) 85 bpm    Heart Rate (Exit) 70 bpm    Oxygen Saturation (Admit) 93 %     Oxygen Saturation (Exercise) 95 %    Oxygen Saturation (Exit) 92 %    Rating of Perceived Exertion (Exercise) 13    Perceived Dyspnea (Exercise) 2    Duration Continue with 30 min of aerobic exercise without signs/symptoms of physical distress.    Intensity THRR unchanged      Resistance Training   Training Prescription Yes    Weight blue bands    Reps 10-15    Time 10 Minutes      Treadmill   MPH 2    Grade 0    Minutes 15    METs 2.53      Recumbant Elliptical   Level 2    Minutes 15    METs 3.8             Nutrition:  Target Goals: Understanding of nutrition guidelines, daily intake of sodium 1500mg , cholesterol 200mg , calories 30% from fat and 7% or less from saturated fats, daily to have 5 or more servings of fruits and vegetables.  Biometrics:    Nutrition Therapy Plan and Nutrition Goals:  Nutrition Therapy & Goals - 02/24/23 1401       Nutrition Therapy   Diet Heart Healthy Diet    Drug/Food Interactions Statins/Certain Fruits      Personal Nutrition Goals   Nutrition Goal Patient to improve diet quality by using the plate method as a guide for meal planning to include lean protein/plant protein, fruits, vegetables, whole grains, nonfat dairy as part of a well-balanced diet.    Comments Satsuki reports eating three meals per day and eating a wide variety of foods including lean protein, fruits and vegetables. She seldom eats out or fries foods. She reports overeating salt and sweets; however, she is not receptive to cutting back on these things at this time. Encouraged patient to priortize high potassium/high fiber foods including fruits and vegetables. Jannell will continue to benefit from participation in pulmonary rehab for nutrition, exercise, and lifestyle modification.      Intervention Plan   Intervention Prescribe, educate and counsel regarding individualized specific dietary modifications aiming towards targeted core components such as weight,  hypertension, lipid management, diabetes, heart failure and other comorbidities.;Nutrition handout(s) given to patient.    Expected Outcomes Short Term Goal: Understand basic principles of dietary content, such as calories, fat, sodium, cholesterol and nutrients.;Long Term Goal: Adherence to prescribed nutrition plan.             Nutrition Assessments:  Nutrition Assessments - 02/24/23 1412       Rate Your Plate Scores   Pre Score 60            MEDIFICTS Score Key: ?70 Need to make dietary changes  40-70 Heart Healthy Diet ? 40 Therapeutic Level Cholesterol Diet  Flowsheet Row PULMONARY REHAB CHRONIC OBSTRUCTIVE PULMONARY DISEASE from 02/24/2023 in [facility]  Picture Your Plate Total Score on Admission 60      Picture Your Plate Scores: <40 Unhealthy dietary pattern with much room for improvement. 41-50 Dietary pattern unlikely to meet recommendations for good health and room for improvement. 51-60 More healthful dietary pattern, with some room for improvement.  >60 Healthy dietary pattern, although there may be some specific behaviors that could be improved.    Nutrition Goals Re-Evaluation:  Nutrition Goals Re-Evaluation     Row Name 02/24/23 1401             Goals   Current Weight 172 lb 13.5 oz (78.4 kg)       Expected Outcome Jesseka reports eating three meals per day and eating a wide variety of foods including lean protein, fruits and vegetables. She seldom eats out or fries foods. She reports overeating salt and sweets; however, she is not receptive to cutting back on these things at this time. Encouraged patient to priortize high potassium/high fiber foods including fruits and vegetables. Graci will continue to benefit from participation in pulmonary rehab for nutrition, exercise, and lifestyle modification.                Nutrition Goals Discharge (Final Nutrition Goals Re-Evaluation):   Nutrition Goals Re-Evaluation - 02/24/23 1401       Goals   Current Weight 172 lb 13.5 oz (78.4 kg)    Expected Outcome Yashvi reports eating three meals per day and eating a wide variety of foods including lean protein, fruits and vegetables. She seldom eats out or fries foods. She reports overeating salt and sweets; however, she is not receptive to cutting back on these things at this time. Encouraged patient to priortize high potassium/high fiber foods including fruits and vegetables. Tanica will continue to benefit from participation in pulmonary rehab for nutrition, exercise, and lifestyle modification.             Psychosocial: Target Goals: Acknowledge presence or absence of significant depression and/or stress, maximize coping skills, provide positive support system. Participant is able to verbalize types and ability to use techniques and skills needed for reducing stress and depression.  Initial Review & Psychosocial Screening:  Initial Psych Review & Screening - 02/16/23 1106       Barriers   Psychosocial barriers to participate in program --      Screening Interventions   Interventions --             Quality of Life Scores:  Scores of 19 and below usually indicate a poorer quality of life in these areas.  A difference of  2-3 points is a clinically meaningful difference.  A difference of 2-3 points in the total score of the Quality of Life Index has been associated with significant improvement in overall quality of life, self-image, physical symptoms, and general health in studies assessing change in quality of life.  PHQ-9: Review Flowsheet       02/16/2023 09/23/2021  Depression screen PHQ 2/9  Decreased Interest 0 0  Down, Depressed, Hopeless 1 0  PHQ -  2 Score 1 0  Altered sleeping 1 1  Tired, decreased energy 1 3  Change in appetite 2 3  Feeling bad or failure about yourself  0 0  Trouble concentrating 1 1  Moving slowly or fidgety/restless 0 0   Suicidal thoughts 0 0  PHQ-9 Score 6 8  Difficult doing work/chores Somewhat difficult -   Interpretation of Total Score  Total Score Depression Severity:  1-4 = Minimal depression, 5-9 = Mild depression, 10-14 = Moderate depression, 15-19 = Moderately severe depression, 20-27 = Severe depression   Psychosocial Evaluation and Intervention:  Psychosocial Evaluation - 02/16/23 1056       Psychosocial Evaluation & Interventions   Interventions Relaxation education;Encouraged to exercise with the program and follow exercise prescription    Comments Pt is depressed and stressed about her sister's health. She stated she has a hard time sleeping because she is up worrying or thinking about things. She doesn't like to drive far distances, also concerned when she goes in/out of [employer] and/or [employer]    Expected Outcomes For Terri to participated in [place] without any psychosocial barriers or concerns    Continue Psychosocial Services  Follow up required by staff   Will continue and monitor Calina for any psychosocial needs that may arise            Psychosocial Re-Evaluation:  Psychosocial Re-Evaluation     Row Name 03/04/23 0939             Psychosocial Re-Evaluation   Current issues with Current Depression;Current Sleep Concerns;Current Stress Concerns       Comments No change in psychosocial assessment since 02/24/23. Haniah still acknowledges the above issues with no change noted.       Expected Outcomes For Koryn to see a therapist about her sister's terminal diagnosis. She currently declines a referral at this time. Chizara also declines to speak to her PCP about possible medications.       Interventions Stress management education;Encouraged to attend Pulmonary Rehabilitation for the exercise       Continue Psychosocial Services  Follow up required by staff  We will continue to monitor and assess Jacoby's mental health while in the program                Psychosocial  Discharge (Final Psychosocial Re-Evaluation):  Psychosocial Re-Evaluation - 03/04/23 0939       Psychosocial Re-Evaluation   Current issues with Current Depression;Current Sleep Concerns;Current Stress Concerns    Comments No change in psychosocial assessment since 02/24/23. Makaylyn still acknowledges the above issues with no change noted.    Expected Outcomes For Lameshia to see a therapist about her sister's terminal diagnosis. She currently declines a referral at this time. Fiona also declines to speak to her PCP about possible medications.    Interventions Stress management education;Encouraged to attend Pulmonary Rehabilitation for the exercise    Continue Psychosocial Services  Follow up required by staff   We will continue to monitor and assess Kaylana's mental health while in the program            Education: Education Goals: Education classes will be provided on a weekly basis, covering required topics. Participant will state understanding/return demonstration of topics presented.  Learning Barriers/Preferences:  Learning Barriers/Preferences - 02/16/23 1056       Learning Barriers/Preferences   Learning Barriers Hearing;Sight    Learning Preferences Written Material;Group Instruction;Individual Instruction             Education  Topics: Introduction to Pulmonary Rehab Group instruction provided by PowerPoint, verbal discussion, and written material to support subject matter. Instructor reviews what Pulmonary Rehab is, the purpose of the program, and how patients are referred.     Know Your Numbers Group instruction that is supported by a PowerPoint presentation. Instructor discusses importance of knowing and understanding resting, exercise, and post-exercise oxygen saturation, heart rate, and blood pressure. Oxygen saturation, heart rate, blood pressure, rating of perceived exertion, and dyspnea are reviewed along with a normal range for these values.    Exercise  for the Pulmonary Patient Group instruction that is supported by a PowerPoint presentation. Instructor discusses benefits of exercise, core components of exercise, frequency, duration, and intensity of an exercise routine, importance of utilizing pulse oximetry during exercise, safety while exercising, and options of places to exercise outside of rehab.       MET Level  Group instruction provided by PowerPoint, verbal discussion, and written material to support subject matter. Instructor reviews what METs are and how to increase METs.  Flowsheet Row PULMONARY REHAB CHRONIC OBSTRUCTIVE PULMONARY DISEASE from 02/24/2023 in [facility]  Date 02/24/23  Educator EP  Instruction Review Code 1- Verbalizes Understanding       Pulmonary Medications Verbally interactive group education provided by instructor with focus on inhaled medications and proper administration. Flowsheet Row PULMONARY REHAB CHRONIC OBSTRUCTIVE PULMONARY DISEASE from 02/26/2023 in [facility]  Date 02/26/23  Educator RT  Instruction Review Code 1- Verbalizes Understanding       Anatomy and Physiology of the Respiratory System Group instruction provided by PowerPoint, verbal discussion, and written material to support subject matter. Instructor reviews respiratory cycle and anatomical components of the respiratory system and their functions. Instructor also reviews differences in obstructive and restrictive respiratory diseases with examples of each.    Oxygen Safety Group instruction provided by PowerPoint, verbal discussion, and written material to support subject matter. There is an overview of "What is Oxygen" and "Why do we need it".  Instructor also reviews how to create a safe environment for oxygen use, the importance of using oxygen as prescribed, and the risks of noncompliance. There is a brief  discussion on traveling with oxygen and resources the patient may utilize.   Oxygen Use Group instruction provided by PowerPoint, verbal discussion, and written material to discuss how supplemental oxygen is prescribed and different types of oxygen supply systems. Resources for more information are provided.    Breathing Techniques Group instruction that is supported by demonstration and informational handouts. Instructor discusses the benefits of pursed lip and diaphragmatic breathing and detailed demonstration on how to perform both.     Risk Factor Reduction Group instruction that is supported by a PowerPoint presentation. Instructor discusses the definition of a risk factor, different risk factors for pulmonary disease, and how the heart and lungs work together.   MD Day A group question and answer session with a medical doctor that allows participants to ask questions that relate to their pulmonary disease state.   Nutrition for the Pulmonary Patient Group instruction provided by PowerPoint slides, verbal discussion, and written materials to support subject matter. The instructor gives an explanation and review of healthy diet recommendations, which includes a discussion on weight management, recommendations for fruit and vegetable consumption, as well as protein, fluid, caffeine, fiber, sodium, sugar, and alcohol. Tips for eating when patients are short of breath are  discussed.    Other Education Group or individual verbal, written, or video instructions that support the educational goals of the pulmonary rehab program.    Knowledge Questionnaire Score:  Knowledge Questionnaire Score - 02/16/23 1132       Knowledge Questionnaire Score   Pre Score 17/18             Core Components/Risk Factors/Patient Goals at Admission:  Personal Goals and Risk Factors at Admission - 02/16/23 1057       Core Components/Risk Factors/Patient Goals on Admission    Weight Management  Weight Loss    Improve shortness of breath with ADL's Yes    Intervention Provide education, individualized exercise plan and daily activity instruction to help decrease symptoms of SOB with activities of daily living.    Expected Outcomes Short Term: Improve cardiorespiratory fitness to achieve a reduction of symptoms when performing ADLs;Long Term: Be able to perform more ADLs without symptoms or delay the onset of symptoms    Increase knowledge of respiratory medications and ability to use respiratory devices properly  Yes    Intervention Provide education and demonstration as needed of appropriate use of medications, inhalers, and oxygen therapy.    Expected Outcomes Short Term: Achieves understanding of medications use. Understands that oxygen is a medication prescribed by physician. Demonstrates appropriate use of inhaler and oxygen therapy.;Long Term: Maintain appropriate use of medications, inhalers, and oxygen therapy.    Stress Yes    Intervention Offer individual and/or small group education and counseling on adjustment to heart disease, stress management and health-related lifestyle change. Teach and support self-help strategies.;Refer participants experiencing significant psychosocial distress to appropriate mental health specialists for further evaluation and treatment. When possible, include family members and significant others in education/counseling sessions.    Expected Outcomes Short Term: Participant demonstrates changes in health-related behavior, relaxation and other stress management skills, ability to obtain effective social support, and compliance with psychotropic medications if prescribed.;Long Term: Emotional wellbeing is indicated by absence of clinically significant psychosocial distress or social isolation.             Core Components/Risk Factors/Patient Goals Review:   Goals and Risk Factor Review     Row Name 03/04/23 0944             Core Components/Risk  Factors/Patient Goals Review   Personal Goals Review Other;Improve shortness of breath with ADL's;Develop more efficient breathing techniques such as purse lipped breathing and diaphragmatic breathing and practicing self-pacing with activity.;Increase knowledge of respiratory medications and ability to use respiratory devices properly.;Stress  Weight loss       Review Byron has completed 3 PR sessions so far. She is currently exercising on the treadmill and recumbent elliptical. She has increased her workload on the recumbent elliptical and increased her speed on the treadmill. Her oxygen saturations have been stable on room air. She is practicing pursed lip breathing. She knows how to report her Rate of Perceived Exertion and her Dyspnea scale to staff. We are hoping exercise will decrease her stress level. Malala has attended the respiratory medication education class. It is too soon to see any progress in weight loss. She is working with our dietician. We will continue to assess any progress.       Expected Outcomes See admission goals                Core Components/Risk Factors/Patient Goals at Discharge (Final Review):   Goals and Risk Factor Review - 03/04/23 0981  Core Components/Risk Factors/Patient Goals Review   Personal Goals Review Other;Improve shortness of breath with ADL's;Develop more efficient breathing techniques such as purse lipped breathing and diaphragmatic breathing and practicing self-pacing with activity.;Increase knowledge of respiratory medications and ability to use respiratory devices properly.;Stress   Weight loss   Review Channah has completed 3 PR sessions so far. She is currently exercising on the treadmill and recumbent elliptical. She has increased her workload on the recumbent elliptical and increased her speed on the treadmill. Her oxygen saturations have been stable on room air. She is practicing pursed lip breathing. She knows how to report her Rate of  Perceived Exertion and her Dyspnea scale to staff. We are hoping exercise will decrease her stress level. Azaylea has attended the respiratory medication education class. It is too soon to see any progress in weight loss. She is working with our dietician. We will continue to assess any progress.    Expected Outcomes See admission goals             ITP Comments:   Comments: Dr. Mechele Collin is Medical Director for Pulmonary Rehab at [facility].

## 2023-03-12 ENCOUNTER — Encounter (HOSPITAL_COMMUNITY)
Admission: RE | Admit: 2023-03-12 | Discharge: 2023-03-12 | Disposition: A | Discharge status: Home / Self Care | Payer: Medicare Other | Source: Ambulatory Visit | Attending: Pulmonary Disease | Admitting: Pulmonary Disease

## 2023-03-12 DIAGNOSIS — J449 Chronic obstructive pulmonary disease, unspecified: Secondary | ICD-10-CM | POA: Diagnosis not present

## 2023-03-12 NOTE — Progress Notes (Signed)
Daily Session Note  Patient Details  Name: Christine Moore MRN: [id number] Date of Birth: [birth year] Referring Provider:   [name] Pulmonary Rehab Walk Test from 02/16/2023 in [facility]  Referring Provider [name]       Encounter Date: 03/12/2023  Check In:  Session Check In - 03/12/23 1144       Check-In   Supervising physician immediately available to respond to emergencies [facility] MD immediately available    Physician(s) Carlos Levering, NP    Location [facility]    Staff Present Samantha Belarus, RD, Dutch Gray, RN, BSN;Randi Reeve BS, ACSM-CEP, Exercise Physiologist;Kaylee Earlene Plater, MS, ACSM-CEP, Exercise Physiologist;Graylon Amory Marcille Buffy, RN, BSN    Virtual Visit No    Medication changes reported     No    Fall or balance concerns reported    No    Tobacco Cessation No Change    Warm-up and Cool-down Performed as group-led instruction    Resistance Training Performed Yes    VAD Patient? No    PAD/SET Patient? No      Pain Assessment   Currently in Pain? No/denies    Multiple Pain Sites No             Capillary Blood Glucose: No results found for this or any previous visit (from the past 24 hour(s)).    Social History   Tobacco Use  Smoking Status Former   Packs/day: 1.00   Years: 22.00   Additional pack years: 0.00   Total pack years: 22.00   Types: Cigarettes   Start date: 1963   Quit date: 1985   Years since quitting: 39.3  Smokeless Tobacco Never    Goals Met:  Proper associated with RPD/PD & O2 Sat Independence with exercise equipment Exercise tolerated well No report of concerns or symptoms today Strength training completed today  Goals Unmet:  Not Applicable  Comments: Service time is from 1005 to 1150.    Dr. Mechele Collin is Medical Director for Pulmonary Rehab at [facility].

## 2023-03-17 ENCOUNTER — Encounter (HOSPITAL_COMMUNITY)
Admission: RE | Admit: 2023-03-17 | Discharge: 2023-03-17 | Disposition: A | Discharge status: Home / Self Care | Payer: Medicare Other | Source: Ambulatory Visit | Attending: Pulmonary Disease | Admitting: Pulmonary Disease

## 2023-03-17 VITALS — Wt 173.1 lb

## 2023-03-17 DIAGNOSIS — J449 Chronic obstructive pulmonary disease, unspecified: Secondary | ICD-10-CM

## 2023-03-17 NOTE — Progress Notes (Signed)
Daily Session Note  Patient Details  Name: Christine Moore MRN: [id number] Date of Birth: 1946-06-25 Referring Provider:   [name] Pulmonary Rehab Walk Test from 02/16/2023 in [facility], [facility]  Referring Provider [name]       Encounter Date: 03/17/2023  Check In:  Session Check In - 03/17/23 1550       Check-In   Supervising physician immediately available to respond to emergencies [facility] MD immediately available    Physician(s) Jari Favre, PA    [facility]    Staff Present Essie Hart, RN, BSN;Randi Idelle Crouch BS, ACSM-CEP, Exercise Physiologist;Lygia Olaes Earlene Plater, MS, ACSM-CEP, Exercise Physiologist;Casey Katrinka Blazing, RT    Virtual Visit No    Medication changes reported     No    Fall or balance concerns reported    No    Tobacco Cessation No Change    Warm-up and Cool-down Performed as group-led instruction    Resistance Training Performed Yes    VAD Patient? No    PAD/SET Patient? No      Pain Assessment   Currently in Pain? No/denies    Multiple Pain Sites No             Capillary Blood Glucose: No results found for this or any previous visit (from the past 24 hour(s)).   Exercise Prescription Changes - 03/17/23 1500       Response to Exercise   Blood Pressure (Admit) 132/64    Blood Pressure (Exercise) 210/100    Blood Pressure (Exit) 128/72    Heart Rate (Admit) 77 bpm    Heart Rate (Exercise) 98 bpm    Heart Rate (Exit) 62 bpm    Oxygen Saturation (Admit) 98 %    Oxygen Saturation (Exercise) 94 %    Oxygen Saturation (Exit) 94 %    Rating of Perceived Exertion (Exercise) 13    Perceived Dyspnea (Exercise) 2    Duration Continue with 30 min of aerobic exercise without signs/symptoms of physical distress.    Intensity THRR unchanged      Progression   Progression Continue to progress workloads to maintain intensity without signs/symptoms of physical distress.      Resistance  Training   Training Prescription Yes    Weight blue bands    Reps 10-15    Time 10 Minutes      Treadmill   MPH 2.2    Grade 1.5    Minutes 15    METs 3.14      Recumbant Elliptical   Level 2    Minutes 15    METs 4.6             Social History   Tobacco Use  Smoking Status Former   Packs/day: 1.00   Years: 22.00   Additional pack years: 0.00   Total pack years: 22.00   Types: Cigarettes   Start date: 1963   Quit date: 1985   Years since quitting: 39.3  Smokeless Tobacco Never    Goals Met:  Proper associated with RPD/PD & O2 Sat Exercise tolerated well No report of concerns or symptoms today Strength training completed today  Goals Unmet:  Not Applicable  Comments: Service time is from 1018 to 1147.    Dr. Mechele Collin is Medical Director for Pulmonary Rehab at [facility].

## 2023-03-19 ENCOUNTER — Encounter (HOSPITAL_COMMUNITY)
Admission: RE | Admit: 2023-03-19 | Discharge: 2023-03-19 | Disposition: A | Discharge status: Home / Self Care | Payer: Medicare Other | Source: Ambulatory Visit | Attending: Pulmonary Disease | Admitting: Pulmonary Disease

## 2023-03-19 DIAGNOSIS — J449 Chronic obstructive pulmonary disease, unspecified: Secondary | ICD-10-CM | POA: Diagnosis not present

## 2023-03-19 NOTE — Progress Notes (Signed)
Daily Session Note  Patient Details  Name: Christine Moore MRN: [id number] Date of Birth: [birth year] Referring Provider:   [name] Pulmonary Rehab Walk Test from 02/16/2023 in [facility] for [facility], Vascular, & [facility]  Referring Provider [name]       Encounter Date: 03/19/2023  Check In:  Session Check In - 03/19/23 1221       Check-In   Supervising physician immediately available to respond to emergencies [facility] MD immediately available    Physician(s) Eligha Bridegroom NP    [facility]    Staff Present Essie Hart, RN, BSN;Bennetta Rudden Idelle Crouch BS, ACSM-CEP, Exercise Physiologist;Kaylee Earlene Plater, MS, ACSM-CEP, Exercise Physiologist;Casey Hermine Messick Belarus, RD, LDN    Virtual Visit No    Medication changes reported     No    Fall or balance concerns reported    No    Tobacco Cessation No Change    Warm-up and Cool-down Performed as group-led instruction    Resistance Training Performed Yes    VAD Patient? No    PAD/SET Patient? No             Capillary Blood Glucose: No results found for this or any previous visit (from the past 24 hour(s)).    Social History   Tobacco Use  Smoking Status Former   Packs/day: 1.00   Years: 22.00   Additional pack years: 0.00   Total pack years: 22.00   Types: Cigarettes   Start date: 1963   Quit date: 1985   Years since quitting: 39.3  Smokeless Tobacco Never    Goals Met:  Independence with exercise equipment Exercise tolerated well No report of concerns or symptoms today Strength training completed today  Goals Unmet:  Not Applicable  Comments: Service time is from 1018 to 1155.    Dr. Mechele Collin is Medical Director for Pulmonary Rehab at [facility].

## 2023-03-24 ENCOUNTER — Encounter (HOSPITAL_COMMUNITY)
Admission: RE | Admit: 2023-03-24 | Discharge: 2023-03-24 | Disposition: A | Discharge status: Home / Self Care | Payer: Medicare Other | Source: Ambulatory Visit | Attending: Pulmonary Disease | Admitting: Pulmonary Disease

## 2023-03-24 DIAGNOSIS — J449 Chronic obstructive pulmonary disease, unspecified: Secondary | ICD-10-CM | POA: Diagnosis not present

## 2023-03-24 NOTE — Progress Notes (Signed)
Spoke with Rudene Re, RN from [facility] about Christine Moore's elevated BP's while exercising. Per Dr. Docia Chuck pt is to stop exercising if BP increases above 180/110 and can resume after BP decreases.

## 2023-03-24 NOTE — Progress Notes (Signed)
Daily Session Note  Patient Details  Name: ADALI PENNINGS MRN: [id number] Date of Birth: Aug 24, 1946 Referring Provider:   [name] Pulmonary Rehab Walk Test from 02/16/2023 in [facility] for Heart, Vascular, & Lung Health  Referring Provider [name]       Encounter Date: 03/24/2023  Check In:  Session Check In - 03/24/23 1216       Check-In   Supervising physician immediately available to respond to emergencies [facility] MD immediately available    Physician(s) Eligha Bridegroom NP    [facility]    Staff Present Essie Hart, RN, BSN;Randi Idelle Crouch BS, ACSM-CEP, Exercise Physiologist;Kaylee Earlene Plater, MS, ACSM-CEP, Exercise Physiologist;Casey Katrinka Blazing, RT    Virtual Visit No    Medication changes reported     No    Fall or balance concerns reported    No    Tobacco Cessation No Change    Warm-up and Cool-down Performed as group-led instruction    Resistance Training Performed Yes    VAD Patient? No    PAD/SET Patient? No      Pain Assessment   Currently in Pain? No/denies    Multiple Pain Sites No             Capillary Blood Glucose: No results found for this or any previous visit (from the past 24 hour(s)).    Social History   Tobacco Use  Smoking Status Former   Packs/day: 1.00   Years: 22.00   Additional pack years: 0.00   Total pack years: 22.00   Types: Cigarettes   Start date: 1963   Quit date: 1985   Years since quitting: 39.3  Smokeless Tobacco Never    Goals Met:  Independence with exercise equipment Improved SOB with ADL's Exercise tolerated well No report of concerns or symptoms today Strength training completed today  Goals Unmet:  Not Applicable  Comments: Service time is from 1006 to 1150    Dr. Mechele Collin is Medical Director for Pulmonary Rehab at [facility].

## 2023-03-26 ENCOUNTER — Encounter (HOSPITAL_COMMUNITY)
Admission: RE | Admit: 2023-03-26 | Discharge: 2023-03-26 | Disposition: A | Discharge status: Home / Self Care | Payer: Medicare Other | Source: Ambulatory Visit | Attending: Pulmonary Disease | Admitting: Pulmonary Disease

## 2023-03-26 DIAGNOSIS — J449 Chronic obstructive pulmonary disease, unspecified: Secondary | ICD-10-CM

## 2023-03-26 NOTE — Progress Notes (Signed)
Daily Session Note  Patient Details  Name: Christine Moore MRN: [id number] Date of Birth: [birth year] Referring Provider:   [name] Pulmonary Rehab Walk Test from 02/16/2023 in [facility] V[facility], [facility]  Referring Provider [name]       Encounter Date: 03/26/2023  Check In:  Session Check In - 03/26/23 1223       Check-In   Supervising physician immediately available to respond to emergencies [facility] MD immediately available    Physician(s) Bernadene Person, NP    [facility]    Staff Present Essie Hart, RN, BSN;Randi Idelle Crouch BS, ACSM-CEP, Exercise Physiologist;Kaylee Earlene Plater, MS, ACSM-CEP, Exercise Physiologist;Yocelyn Brocious Hermine Messick Belarus, RD, LDN    Virtual Visit No    Medication changes reported     No    Fall or balance concerns reported    No    Tobacco Cessation No Change    Warm-up and Cool-down Performed as group-led instruction    Resistance Training Performed Yes    VAD Patient? No    PAD/SET Patient? No      Pain Assessment   Currently in Pain? No/denies    Multiple Pain Sites No             Capillary Blood Glucose: No results found for this or any previous visit (from the past 24 hour(s)).    Social History   Tobacco Use  Smoking Status Former   Packs/day: 1.00   Years: 22.00   Additional pack years: 0.00   Total pack years: 22.00   Types: Cigarettes   Start date: 1963   Quit date: 1985   Years since quitting: 39.3  Smokeless Tobacco Never    Goals Met:  Proper associated with RPD/PD & O2 Sat Independence with exercise equipment Exercise tolerated well No report of concerns or symptoms today Strength training completed today  Goals Unmet:  Not Applicable  Comments: Service time is from 1005 to 1155.    Dr. Mechele Collin is Medical Director for Pulmonary Rehab at [facility].

## 2023-03-31 ENCOUNTER — Encounter (HOSPITAL_COMMUNITY)
Admission: RE | Admit: 2023-03-31 | Discharge: 2023-03-31 | Disposition: A | Discharge status: Home / Self Care | Payer: Medicare Other | Source: Ambulatory Visit | Attending: Pulmonary Disease | Admitting: Pulmonary Disease

## 2023-03-31 VITALS — Wt 173.3 lb

## 2023-03-31 DIAGNOSIS — J449 Chronic obstructive pulmonary disease, unspecified: Secondary | ICD-10-CM | POA: Diagnosis not present

## 2023-03-31 NOTE — Progress Notes (Signed)
Daily Session Note  Patient Details  Name: Christine Moore MRN: [id number] Date of Birth: 07-25-1946 Referring Provider:   [name] Pulmonary Rehab Walk Test from 02/16/2023 in [facility]  Referring Provider [name]       Encounter Date: 03/31/2023  Check In:  Session Check In - 03/31/23 1200       Check-In   Supervising physician immediately available to respond to emergencies [facility] MD immediately available    Physician(s) [name], NP    Location [facility]    Staff Present Essie Hart, RN, BSN;Randi Idelle Crouch BS, ACSM-CEP, Exercise Physiologist;Dariann Huckaba Earlene Plater, MS, ACSM-CEP, Exercise Physiologist;Casey Hermine Messick Belarus, RD, LDN;Carlette Les Pou, RN, BSN    Virtual Visit No    Medication changes reported     No    Fall or balance concerns reported    No    Tobacco Cessation No Change    Warm-up and Cool-down Performed as group-led Writer Performed Yes    VAD Patient? No    PAD/SET Patient? No      Pain Assessment   Currently in Pain? No/denies    Multiple Pain Sites No             Capillary Blood Glucose: No results found for this or any previous visit (from the past 24 hour(s)).   Exercise Prescription Changes - 03/31/23 1200       Response to Exercise   Blood Pressure (Admit) 116/60    Blood Pressure (Exercise) 172/74    Blood Pressure (Exit) 124/72    Heart Rate (Admit) 61 bpm    Heart Rate (Exercise) 86 bpm    Heart Rate (Exit) 66 bpm    Oxygen Saturation (Admit) 93 %    Oxygen Saturation (Exercise) 95 %    Oxygen Saturation (Exit) 91 %    Rating of Perceived Exertion (Exercise) 13    Perceived Dyspnea (Exercise) 1    Duration Continue with 30 min of aerobic exercise without signs/symptoms of physical distress.    Intensity THRR unchanged      Progression   Progression Continue to progress workloads to maintain intensity without  signs/symptoms of physical distress.      Resistance Training   Training Prescription Yes    Weight blue bands    Reps 10-15    Time 10 Minutes      Treadmill   MPH 2.2    Grade 1.5    Minutes 15    METs 3.14      Recumbant Elliptical   Level 1    Minutes 15    METs 2.1             Social History   Tobacco Use  Smoking Status Former   Packs/day: 1.00   Years: 22.00   Additional pack years: 0.00   Total pack years: 22.00   Types: Cigarettes   Start date: 1963   Quit date: 1985   Years since quitting: 39.3  Smokeless Tobacco Never    Goals Met:  Proper associated with RPD/PD & O2 Sat Independence with exercise equipment Exercise tolerated well No report of concerns or symptoms today Strength training completed today  Goals Unmet:  Not Applicable  Comments: Service time is from 1010 to 1152.    Dr. Mechele Collin is Medical Director for Pulmonary Rehab at [facility].

## 2023-04-02 ENCOUNTER — Encounter (HOSPITAL_COMMUNITY)
Admission: RE | Admit: 2023-04-02 | Discharge: 2023-04-02 | Disposition: A | Discharge status: Home / Self Care | Payer: Medicare Other | Source: Ambulatory Visit | Attending: Pulmonary Disease | Admitting: Pulmonary Disease

## 2023-04-02 DIAGNOSIS — J449 Chronic obstructive pulmonary disease, unspecified: Secondary | ICD-10-CM | POA: Insufficient documentation

## 2023-04-02 NOTE — Progress Notes (Signed)
Daily Session Note  Patient Details  Name: Christine Moore MRN: [id number] Date of Birth: [birth year] Referring Provider:   [name] Pulmonary Rehab Walk Test from 02/16/2023 in [facility]  Referring Provider [name]       Encounter Date: 04/02/2023  Check In:  Session Check In - 04/02/23 1139       Check-In   Supervising physician immediately available to respond to emergencies [facility] immediately available    Physician(s) Edd Fabian, NP    [facility]    Staff Present Essie Hart, RN, BSN;Randi Idelle Crouch BS, ACSM-CEP, Exercise Physiologist;Kaylee Earlene Plater, MS, ACSM-CEP, Exercise Physiologist;Yeily Link Katrinka Blazing, RT    Virtual Visit No    Medication changes reported     Yes    Fall or balance concerns reported    No    Tobacco Cessation No Change    Warm-up and Cool-down Performed as group-led instruction    Resistance Training Performed Yes    VAD Patient? No      Pain Assessment   Currently in Pain? No/denies    Multiple Pain Sites No             Capillary Blood Glucose: No results found for this or any previous visit (from the past 24 hour(s)).    Social History   Tobacco Use  Smoking Status Former   Packs/day: 1.00   Years: 22.00   Additional pack years: 0.00   Total pack years: 22.00   Types: Cigarettes   Start date: 1963   Quit date: 1985   Years since quitting: 39.3  Smokeless Tobacco Never    Goals Met:  Proper associated with RPD/PD & O2 Sat Independence with exercise equipment Exercise tolerated well No report of concerns or symptoms today Strength training completed today  Goals Unmet:  Not Applicable  Comments: Service time is from 1008 to 1143.    Dr. Mechele Collin is Medical Director for Pulmonary Rehab at [facility].

## 2023-04-02 NOTE — Progress Notes (Addendum)
Pt reported having dizzy spells at home intermittently. Pt reports she is scared of driving because of these spells.  Pt stated she had inner ear testing in the past. Instructed pt to start logging B/P's and call PCP about possible migraines due to seeing aura's. Pt denied dizzy spells pre and during exercise. Advised pt to report dizziness at anytime during exercise.

## 2023-04-07 ENCOUNTER — Encounter (HOSPITAL_COMMUNITY)
Admission: RE | Admit: 2023-04-07 | Discharge: 2023-04-07 | Disposition: A | Discharge status: Home / Self Care | Payer: Medicare Other | Source: Ambulatory Visit | Attending: Pulmonary Disease | Admitting: Pulmonary Disease

## 2023-04-07 DIAGNOSIS — J449 Chronic obstructive pulmonary disease, unspecified: Secondary | ICD-10-CM | POA: Diagnosis not present

## 2023-04-07 NOTE — Progress Notes (Signed)
Daily Session Note  Patient Details  Name: Christine Moore MRN: [id number] Date of Birth: Apr 22, 1946 Referring Provider:   [name] Pulmonary Rehab Walk Test from 02/16/2023 in [facility], V[facility], & [facility]  Referring Provider [name]       Encounter Date: 04/07/2023  Check In:  Session Check In - 04/07/23 1220       Check-In   Supervising physician immediately available to respond to emergencies [facility] immediately available    Physician(s) Lavonna Monarch, NP    [facility]    Staff Present Essie Hart, RN, BSN;Larnie Heart Idelle Crouch BS, ACSM-CEP, Exercise Physiologist;Kaylee Earlene Plater, MS, ACSM-CEP, Exercise Physiologist;Casey Katrinka Blazing, Pennelope Bracken, RN, BSN    Virtual Visit No    Medication changes reported     No    Fall or balance concerns reported    No    Tobacco Cessation No Change    Warm-up and Cool-down Performed as group-led instruction    Resistance Training Performed Yes    VAD Patient? No    PAD/SET Patient? No      Pain Assessment   Currently in Pain? No/denies             Capillary Blood Glucose: No results found for this or any previous visit (from the past 24 hour(s)).    Social History   Tobacco Use  Smoking Status Former   Packs/day: 1.00   Years: 22.00   Additional pack years: 0.00   Total pack years: 22.00   Types: Cigarettes   Start date: 1963   Quit date: 1985   Years since quitting: 39.3  Smokeless Tobacco Never    Goals Met:  Independence with exercise equipment Improved SOB with ADL's Exercise tolerated well No report of concerns or symptoms today Strength training completed today  Goals Unmet:  Not Applicable  Comments: Service time is from 1023 to 1150.    Dr. Mechele Collin is Medical Director for Pulmonary Rehab at [facility].

## 2023-04-09 ENCOUNTER — Encounter (HOSPITAL_COMMUNITY)
Admission: RE | Admit: 2023-04-09 | Discharge: 2023-04-09 | Disposition: A | Discharge status: Home / Self Care | Payer: Medicare Other | Source: Ambulatory Visit | Attending: Pulmonary Disease | Admitting: Pulmonary Disease

## 2023-04-09 DIAGNOSIS — J449 Chronic obstructive pulmonary disease, unspecified: Secondary | ICD-10-CM | POA: Diagnosis not present

## 2023-04-14 ENCOUNTER — Encounter (HOSPITAL_COMMUNITY)
Admission: RE | Admit: 2023-04-14 | Discharge: 2023-04-14 | Disposition: A | Discharge status: Home / Self Care | Payer: Medicare Other | Source: Ambulatory Visit | Attending: Pulmonary Disease | Admitting: Pulmonary Disease

## 2023-04-14 VITALS — Wt 173.7 lb

## 2023-04-14 DIAGNOSIS — J449 Chronic obstructive pulmonary disease, unspecified: Secondary | ICD-10-CM

## 2023-04-14 NOTE — Progress Notes (Signed)
Daily Session Note  Patient Details  Name: Christine Moore MRN: [id number] Date of Birth: 1946-09-16 Referring Provider:   [name] Pulmonary Rehab Walk Test from 02/16/2023 in [facility], [facility], [facility]  Referring Provider [name]       Encounter Date: 04/14/2023  Check In:  Session Check In - 04/14/23 1158       Check-In   Supervising physician immediately available to respond to emergencies [facility] MD immediately available    Physician(s) Jari Favre, PA    Location [facility]    Staff Present Samantha Belarus, RD, Dutch Gray, RN, BSN;Randi Reeve BS, ACSM-CEP, Exercise Physiologist;Olinty Peggye Pitt, MS, ACSM-CEP, Exercise Physiologist;Avrie Kedzierski Earlene Plater, MS, ACSM-CEP, Exercise Physiologist;Casey Katrinka Blazing, RT    Virtual Visit No    Medication changes reported     No    Fall or balance concerns reported    No    Tobacco Cessation No Change    Warm-up and Cool-down Performed as group-led instruction    Resistance Training Performed Yes    VAD Patient? No    PAD/SET Patient? No      Pain Assessment   Currently in Pain? No/denies    Multiple Pain Sites No             Capillary Blood Glucose: No results found for this or any previous visit (from the past 24 hour(s)).   Exercise Prescription Changes - 04/14/23 1200       Response to Exercise   Blood Pressure (Admit) 142/60    Blood Pressure (Exercise) 164/70    Blood Pressure (Exit) 120/60    Heart Rate (Admit) 68 bpm    Heart Rate (Exercise) 83 bpm    Heart Rate (Exit) 66 bpm    Oxygen Saturation (Admit) 95 %    Oxygen Saturation (Exercise) 95 %    Oxygen Saturation (Exit) 94 %    Rating of Perceived Exertion (Exercise) 11    Perceived Dyspnea (Exercise) 1    Duration Continue with 30 min of aerobic exercise without signs/symptoms of physical distress.    Intensity THRR unchanged      Progression   Progression Continue to progress workloads to maintain  intensity without signs/symptoms of physical distress.      Resistance Training   Training Prescription Yes    Weight blue bands    Reps 10-15    Time 10 Minutes      Treadmill   MPH 2.2    Grade 1.5    Minutes 15    METs 3.14      Recumbant Elliptical   Level 2    Minutes 15    METs 4.2             Social History   Tobacco Use  Smoking Status Former   Packs/day: 1.00   Years: 22.00   Additional pack years: 0.00   Total pack years: 22.00   Types: Cigarettes   Start date: 1963   Quit date: 1985   Years since quitting: 39.3  Smokeless Tobacco Never    Goals Met:  Proper associated with RPD/PD & O2 Sat Exercise tolerated well No report of concerns or symptoms today Strength training completed today  Goals Unmet:  Not Applicable  Comments: Service time is from 1016 to 1140.    Dr. Mechele Collin is Medical Director for Pulmonary Rehab at [facility].

## 2023-04-15 NOTE — Progress Notes (Signed)
Pulmonary Individual Treatment Plan  Patient Details  Name: Christine Moore MRN: [id number] Date of Birth: Jun 30, 1946 Referring Provider:   [name] Pulmonary Rehab Walk Test from 02/16/2023 in [facility]  Referring Provider [name]       Initial Encounter Date:  Flowsheet Row Pulmonary Rehab Walk Test from 02/16/2023 in [facility]  Date 02/16/23       Visit Diagnosis: Stage 2 moderate COPD by GOLD classification (HCC)  Patient's Home Medications on Admission:   Current Outpatient Medications:    acyclovir (ZOVIRAX) 400 MG tablet, Take 400 mg by mouth 3 (three) times daily as needed (fever blisters)., Disp: , Rfl:    atorvastatin (LIPITOR) 10 MG tablet, Take 10 mg by mouth at bedtime., Disp: , Rfl:    chlorpheniramine (CHLOR-TRIMETON) 4 MG tablet, Take 4 mg by mouth See admin instructions. Take 4 mg twice daily, may increase to 4 times daily as needed for allergies, Disp: , Rfl:    Cholecalciferol (VITAMIN D) 50 MCG (2000 UT) tablet, Take 2,000 Units by mouth 2 (two) times daily., Disp: , Rfl:    clobetasol ointment (TEMOVATE) 0.05 %, Apply 1 application  topically daily as needed (eczema)., Disp: , Rfl:    diltiazem (CARDIZEM CD) 240 MG 24 hr capsule, Take 1 capsule (240 mg total) by mouth daily. (Patient taking differently: Take 240 mg by mouth daily. Pt stated they increased her dose to 240mg  when cards D/Cd her Multaq), Disp: 90 capsule, Rfl: 3   ELIQUIS 5 MG TABS tablet, Take 1 tablet (5 mg total) by mouth 2 (two) times daily., Disp: 60 tablet, Rfl: 5   Fluticasone-Umeclidin-Vilant (TRELEGY ELLIPTA) 200-62.5-25 MCG/INH AEPB, Inhale 1 puff into the lungs daily., Disp: , Rfl:    hydrocortisone 2.5 % cream, Apply 1 Application topically daily as needed (facial eczema)., Disp: , Rfl:    Ketotifen Fumarate (ALAWAY OP), Place 1 drop into both eyes daily as needed  (allergies)., Disp: , Rfl:    levalbuterol (XOPENEX HFA) 45 MCG/ACT inhaler, Inhale 2 puffs into the lungs 4 (four) times daily as needed for shortness of breath or wheezing., Disp: 1 each, Rfl: 5   levalbuterol (XOPENEX) 0.63 MG/3ML nebulizer solution, Take 3 mLs (0.63 mg total) by nebulization every 6 (six) hours as needed for wheezing or shortness of breath., Disp: 360 mL, Rfl: 12   levothyroxine (SYNTHROID) 50 MCG tablet, Take 50 mcg by mouth daily., Disp: , Rfl:    losartan (COZAAR) 50 MG tablet, Take 1 tablet (50 mg total) by mouth 2 (two) times daily., Disp: 180 tablet, Rfl: 2   Lutein-Zeaxanthin 25-5 MG CAPS, Take 1 capsule by mouth at bedtime., Disp: , Rfl:    montelukast (SINGULAIR) 10 MG tablet, Take 10 mg by mouth at bedtime., Disp: , Rfl:    naproxen sodium (ALEVE) 220 MG tablet, Take 220 mg by mouth daily as needed (headaches)., Disp: , Rfl:    omeprazole (PRILOSEC OTC) 20 MG tablet, Take 20 mg by mouth daily before breakfast., Disp: , Rfl:    OVER THE COUNTER MEDICATION, Take 250 mg by mouth daily. Magnesium 250mg , Disp: , Rfl:    OVER THE COUNTER MEDICATION, Take 500 mcg by mouth daily. Vitamin B12, Disp: , Rfl:    Probiotic Product (PROBIOTIC-10 PO), Take 1 tablet by mouth daily., Disp: , Rfl:    sodium chloride (OCEAN) 0.65 % SOLN nasal spray, Place 1  spray into both nostrils as needed for congestion., Disp: , Rfl:   Past Medical History: Past Medical History:  Diagnosis Date   Atrial fibrillation (HCC)    Atrial flutter (HCC)    COPD (chronic obstructive pulmonary disease) (HCC)    Eczema    Hyperlipidemia    Hypothyroid    Urinary incontinence     Tobacco Use: Social History   Tobacco Use  Smoking Status Former   Packs/day: 1.00   Years: 22.00   Additional pack years: 0.00   Total pack years: 22.00   Types: Cigarettes   Start date: 1963   Quit date: 1985   Years since quitting: 39.3  Smokeless Tobacco Never    Labs: Review Flowsheet        No data to  display          Capillary Blood Glucose: No results found for: "GLUCAP"   Pulmonary Assessment Scores:  Pulmonary Assessment Scores     Row Name 02/16/23 1101         ADL UCSD   SOB Score total 28       CAT Score   CAT Score 24       mMRC Score   mMRC Score 3             UCSD: Self-administered rating of dyspnea associated with activities of daily living (ADLs) 6-point scale (0 = "not at all" to 5 = "maximal or unable to do because of breathlessness")  Scoring Scores range from 0 to 120.  Minimally important difference is 5 units  CAT: CAT can identify the health impairment of COPD patients and is better correlated with disease progression.  CAT has a scoring range of zero to 40. The CAT score is classified into four groups of low (less than 10), medium (10 - 20), high (21-30) and very high (31-40) based on the impact level of disease on health status. A CAT score over 10 suggests significant symptoms.  A worsening CAT score could be explained by an exacerbation, poor medication adherence, poor inhaler technique, or progression of COPD or comorbid conditions.  CAT MCID is 2 points  mMRC: mMRC (Modified Medical Research Council) Dyspnea Scale is used to assess the degree of baseline functional disability in patients of respiratory disease due to dyspnea. No minimal important difference is established. A decrease in score of 1 point or greater is considered a positive change.   Pulmonary Function Assessment:  Pulmonary Function Assessment - 02/16/23 1054       Breath   Bilateral Breath Sounds Wheezes;Decreased;Rhonchi    Shortness of Breath Yes             Exercise Target Goals: Exercise Program Goal: Individual exercise prescription set using results from initial 6 min walk test and THRR while considering  patient's activity barriers and safety.   Exercise Prescription Goal: Initial exercise prescription builds to 30-45 minutes a day of aerobic  activity, 2-3 days per week.  Home exercise guidelines will be given to patient during program as part of exercise prescription that the participant will acknowledge.  Activity Barriers & Risk Stratification:  Activity Barriers & Cardiac Risk Stratification - 02/16/23 1100       Activity Barriers & Cardiac Risk Stratification   Activity Barriers Deconditioning;Muscular Weakness;Shortness of Breath;History of Falls;Back Problems             6 Minute Walk:  6 Minute Walk     Row Name 02/16/23 1146  6 Minute Walk   Phase Initial     Distance 940 feet     Walk Time 6 minutes     # of Rest Breaks 0     MPH 1.78     METS 2.15     RPE 7     Perceived Dyspnea  0     VO2 Peak 7.51     Symptoms Yes (comment)     Comments Productive cough     Resting HR 66 bpm     Resting BP 144/64     Resting Oxygen Saturation  95 %     Exercise Oxygen Saturation  during 6 min walk 94 %     Max Ex. HR 118 bpm     Max Ex. BP 136/62     2 Minute Post BP 120/58       Interval HR   1 Minute HR 66     2 Minute HR 99     3 Minute HR 102     4 Minute HR 119     5 Minute HR 104     6 Minute HR 118     2 Minute Post HR 69     Interval Heart Rate? Yes       Interval Oxygen   Interval Oxygen? Yes     Baseline Oxygen Saturation % 95 %     1 Minute Oxygen Saturation % 94 %     1 Minute Liters of Oxygen 0 L     2 Minute Oxygen Saturation % 94 %     2 Minute Liters of Oxygen 0 L     3 Minute Oxygen Saturation % 94 %     3 Minute Liters of Oxygen 0 L     4 Minute Oxygen Saturation % 95 %     4 Minute Liters of Oxygen 0 L     5 Minute Oxygen Saturation % 95 %     5 Minute Liters of Oxygen 0 L     6 Minute Oxygen Saturation % 95 %     6 Minute Liters of Oxygen 0 L     2 Minute Post Oxygen Saturation % 95 %     2 Minute Post Liters of Oxygen 0 L              Oxygen Initial Assessment:  Oxygen Initial Assessment - 02/16/23 1054       Home Oxygen   Home Oxygen Device None     Sleep Oxygen Prescription None    Home Exercise Oxygen Prescription None    Home Resting Oxygen Prescription None             Oxygen Re-Evaluation:  Oxygen Re-Evaluation     Row Name 03/03/23 0909 04/08/23 1127           Program Oxygen Prescription   Program Oxygen Prescription None None        Home Oxygen   Home Oxygen Device None None      Sleep Oxygen Prescription None None      Home Exercise Oxygen Prescription None None      Home Resting Oxygen Prescription None None      Compliance with Home Oxygen Use No No        Goals/Expected Outcomes   Short Term Goals To learn and exhibit compliance with exercise, home and travel O2 prescription;To learn and understand importance of maintaining oxygen saturations>88%;To learn and demonstrate proper use  of respiratory medications;To learn and demonstrate proper pursed lip breathing techniques or other breathing techniques. ;To learn and understand importance of monitoring SPO2 with pulse oximeter and demonstrate accurate use of the pulse oximeter. To learn and exhibit compliance with exercise, home and travel O2 prescription;To learn and understand importance of maintaining oxygen saturations>88%;To learn and demonstrate proper use of respiratory medications;To learn and demonstrate proper pursed lip breathing techniques or other breathing techniques. ;To learn and understand importance of monitoring SPO2 with pulse oximeter and demonstrate accurate use of the pulse oximeter.      Long  Term Goals Exhibits compliance with exercise, home  and travel O2 prescription;Verbalizes importance of monitoring SPO2 with pulse oximeter and return demonstration;Maintenance of O2 saturations>88%;Exhibits proper breathing techniques, such as pursed lip breathing or other method taught during program session;Compliance with respiratory medication;Demonstrates proper use of MDI's Exhibits compliance with exercise, home  and travel O2 prescription;Verbalizes  importance of monitoring SPO2 with pulse oximeter and return demonstration;Maintenance of O2 saturations>88%;Exhibits proper breathing techniques, such as pursed lip breathing or other method taught during program session;Compliance with respiratory medication;Demonstrates proper use of MDI's      Goals/Expected Outcomes Compliance and understanding of oxygen saturation monitoring and breathing techniques to decrease shortness of breath. Compliance and understanding of oxygen saturation monitoring and breathing techniques to decrease shortness of breath.               Oxygen Discharge (Final Oxygen Re-Evaluation):  Oxygen Re-Evaluation - 04/08/23 1127       Program Oxygen Prescription   Program Oxygen Prescription None      Home Oxygen   Home Oxygen Device None    Sleep Oxygen Prescription None    Home Exercise Oxygen Prescription None    Home Resting Oxygen Prescription None    Compliance with Home Oxygen Use No      Goals/Expected Outcomes   Short Term Goals To learn and exhibit compliance with exercise, home and travel O2 prescription;To learn and understand importance of maintaining oxygen saturations>88%;To learn and demonstrate proper use of respiratory medications;To learn and demonstrate proper pursed lip breathing techniques or other breathing techniques. ;To learn and understand importance of monitoring SPO2 with pulse oximeter and demonstrate accurate use of the pulse oximeter.    Long  Term Goals Exhibits compliance with exercise, home  and travel O2 prescription;Verbalizes importance of monitoring SPO2 with pulse oximeter and return demonstration;Maintenance of O2 saturations>88%;Exhibits proper breathing techniques, such as pursed lip breathing or other method taught during program session;Compliance with respiratory medication;Demonstrates proper use of MDI's    Goals/Expected Outcomes Compliance and understanding of oxygen saturation monitoring and breathing techniques to  decrease shortness of breath.             Initial Exercise Prescription:  Initial Exercise Prescription - 02/16/23 1100       Date of Initial Exercise RX and Referring Provider   Date 02/16/23    Referring Provider [name]    Expected Discharge Date 05/14/23      Elliptical   Level 1    Speed 1    Minutes 15      Track   Minutes 15    METs 2.15      Prescription Details   Frequency (times per week) 2    Duration Progress to 30 minutes of continuous aerobic without signs/symptoms of physical distress      Intensity   THRR 40-80% of Max Heartrate 56-113    Ratings of Perceived Exertion 11-13  Perceived Dyspnea 0-4      Progression   Progression Continue progressive overload as per policy without signs/symptoms or physical distress.      Resistance Training   Training Prescription Yes    Weight blue bands    Reps 10-15             Perform Capillary Blood Glucose checks as needed.  Exercise Prescription Changes:   Exercise Prescription Changes     Row Name 03/03/23 1500 03/17/23 1500 03/31/23 1200 04/14/23 1200       Response to Exercise   Blood Pressure (Admit) 142/68 132/64 116/60 142/60    Blood Pressure (Exercise) 152/70 210/100 172/74 164/70    Blood Pressure (Exit) 130/62 128/72 124/72 120/60    Heart Rate (Admit) 69 bpm 77 bpm 61 bpm 68 bpm    Heart Rate (Exercise) 85 bpm 98 bpm 86 bpm 83 bpm    Heart Rate (Exit) 70 bpm 62 bpm 66 bpm 66 bpm    Oxygen Saturation (Admit) 93 % 98 % 93 % 95 %    Oxygen Saturation (Exercise) 95 % 94 % 95 % 95 %    Oxygen Saturation (Exit) 92 % 94 % 91 % 94 %    Rating of Perceived Exertion (Exercise) 13 13 13 11     Perceived Dyspnea (Exercise) 2 2 1 1     Duration Continue with 30 min of aerobic exercise without signs/symptoms of physical distress. Continue with 30 min of aerobic exercise without signs/symptoms of physical distress. Continue with 30 min of aerobic exercise without signs/symptoms of physical  distress. Continue with 30 min of aerobic exercise without signs/symptoms of physical distress.    Intensity THRR unchanged THRR unchanged THRR unchanged THRR unchanged      Progression   Progression -- Continue to progress workloads to maintain intensity without signs/symptoms of physical distress. Continue to progress workloads to maintain intensity without signs/symptoms of physical distress. Continue to progress workloads to maintain intensity without signs/symptoms of physical distress.      Resistance Training   Training Prescription Yes Yes Yes Yes    Weight blue bands blue bands blue bands blue bands    Reps 10-15 10-15 10-15 10-15    Time 10 Minutes 10 Minutes 10 Minutes 10 Minutes      Treadmill   MPH 2 2.2 2.2 2.2    Grade 0 1.5 1.5 1.5    Minutes 15 15 15 15     METs 2.53 3.14 3.14 3.14      Recumbant Elliptical   Level 2 2 1 2     Minutes 15 15 15 15     METs 3.8 4.6 2.1 4.2             Exercise Comments:   Exercise Comments     Row Name 02/24/23 1544           Exercise Comments Pt completed first day of exercise. Christine Moore exercised for 15 min on the recumbent elliptical and 8 min on the track. She averaged 2.8 METs at level 1 on the recumbent elliptical and 1.77 METs on the track. Christine Moore performed the warmup and cooldown standing without limitations. Discussed METs.                Exercise Goals and Review:   Exercise Goals     Row Name 02/16/23 1100 03/03/23 0907 04/08/23 1125         Exercise Goals   Increase Physical Activity Yes Yes Yes  Intervention Provide advice, education, support and counseling about physical activity/exercise needs.;Develop an individualized exercise prescription for aerobic and resistive training based on initial evaluation findings, risk stratification, comorbidities and participant's personal goals. Provide advice, education, support and counseling about physical activity/exercise needs.;Develop an individualized  exercise prescription for aerobic and resistive training based on initial evaluation findings, risk stratification, comorbidities and participant's personal goals. Provide advice, education, support and counseling about physical activity/exercise needs.;Develop an individualized exercise prescription for aerobic and resistive training based on initial evaluation findings, risk stratification, comorbidities and participant's personal goals.     Expected Outcomes Short Term: Attend rehab on a regular basis to increase amount of physical activity.;Long Term: Exercising regularly at least 3-5 days a week.;Long Term: Add in home exercise to make exercise part of routine and to increase amount of physical activity. Short Term: Attend rehab on a regular basis to increase amount of physical activity.;Long Term: Exercising regularly at least 3-5 days a week.;Long Term: Add in home exercise to make exercise part of routine and to increase amount of physical activity. Short Term: Attend rehab on a regular basis to increase amount of physical activity.;Long Term: Exercising regularly at least 3-5 days a week.;Long Term: Add in home exercise to make exercise part of routine and to increase amount of physical activity.     Increase Strength and Stamina Yes Yes Yes     Intervention Provide advice, education, support and counseling about physical activity/exercise needs.;Develop an individualized exercise prescription for aerobic and resistive training based on initial evaluation findings, risk stratification, comorbidities and participant's personal goals. Provide advice, education, support and counseling about physical activity/exercise needs.;Develop an individualized exercise prescription for aerobic and resistive training based on initial evaluation findings, risk stratification, comorbidities and participant's personal goals. Provide advice, education, support and counseling about physical activity/exercise needs.;Develop  an individualized exercise prescription for aerobic and resistive training based on initial evaluation findings, risk stratification, comorbidities and participant's personal goals.     Expected Outcomes Short Term: Increase workloads from initial exercise prescription for resistance, speed, and METs.;Short Term: Perform resistance training exercises routinely during rehab and add in resistance training at home;Long Term: Improve cardiorespiratory fitness, muscular endurance and strength as measured by increased METs and functional capacity ( ) Short Term: Increase workloads from initial exercise prescription for resistance, speed, and METs.;Short Term: Perform resistance training exercises routinely during rehab and add in resistance training at home;Long Term: Improve cardiorespiratory fitness, muscular endurance and strength as measured by increased METs and functional capacity ( ) Short Term: Increase workloads from initial exercise prescription for resistance, speed, and METs.;Short Term: Perform resistance training exercises routinely during rehab and add in resistance training at home;Long Term: Improve cardiorespiratory fitness, muscular endurance and strength as measured by increased METs and functional capacity ( )     Able to understand and use rate of perceived exertion (RPE) scale Yes Yes Yes     Intervention Provide education and explanation on how to use RPE scale Provide education and explanation on how to use RPE scale Provide education and explanation on how to use RPE scale     Expected Outcomes Short Term: Able to use RPE daily in rehab to express subjective intensity level;Long Term:  Able to use RPE to guide intensity level when exercising independently Short Term: Able to use RPE daily in rehab to express subjective intensity level;Long Term:  Able to use RPE to guide intensity level when exercising independently Short Term: Able to use RPE daily in rehab to express subjective  intensity level;Long Term:  Able to use RPE to guide intensity level when exercising independently     Able to understand and use Dyspnea scale Yes Yes Yes     Intervention Provide education and explanation on how to use Dyspnea scale Provide education and explanation on how to use Dyspnea scale Provide education and explanation on how to use Dyspnea scale     Expected Outcomes Short Term: Able to use Dyspnea scale daily in rehab to express subjective sense of shortness of breath during exertion;Long Term: Able to use Dyspnea scale to guide intensity level when exercising independently Short Term: Able to use Dyspnea scale daily in rehab to express subjective sense of shortness of breath during exertion;Long Term: Able to use Dyspnea scale to guide intensity level when exercising independently Short Term: Able to use Dyspnea scale daily in rehab to express subjective sense of shortness of breath during exertion;Long Term: Able to use Dyspnea scale to guide intensity level when exercising independently     Knowledge and understanding of Target Heart Rate Range (THRR) Yes Yes Yes     Intervention Provide education and explanation of THRR including how the numbers were predicted and where they are located for reference Provide education and explanation of THRR including how the numbers were predicted and where they are located for reference Provide education and explanation of THRR including how the numbers were predicted and where they are located for reference     Expected Outcomes Short Term: Able to state/look up THRR;Short Term: Able to use daily as guideline for intensity in rehab;Long Term: Able to use THRR to govern intensity when exercising independently Short Term: Able to state/look up THRR;Short Term: Able to use daily as guideline for intensity in rehab;Long Term: Able to use THRR to govern intensity when exercising independently Short Term: Able to state/look up THRR;Short Term: Able to use daily as  guideline for intensity in rehab;Long Term: Able to use THRR to govern intensity when exercising independently     Understanding of Exercise Prescription Yes Yes Yes     Intervention Provide education, explanation, and written materials on patient's individual exercise prescription Provide education, explanation, and written materials on patient's individual exercise prescription Provide education, explanation, and written materials on patient's individual exercise prescription     Expected Outcomes Short Term: Able to explain program exercise prescription;Long Term: Able to explain home exercise prescription to exercise independently Short Term: Able to explain program exercise prescription;Long Term: Able to explain home exercise prescription to exercise independently Short Term: Able to explain program exercise prescription;Long Term: Able to explain home exercise prescription to exercise independently              Exercise Goals Re-Evaluation :  Exercise Goals Re-Evaluation     Row Name 03/03/23 0907 04/08/23 1125           Exercise Goal Re-Evaluation   Exercise Goals Review Increase Physical Activity;Able to understand and use Dyspnea scale;Understanding of Exercise Prescription;Increase Strength and Stamina;Knowledge and understanding of Target Heart Rate Range (THRR);Able to understand and use rate of perceived exertion (RPE) scale Increase Physical Activity;Able to understand and use Dyspnea scale;Understanding of Exercise Prescription;Increase Strength and Stamina;Knowledge and understanding of Target Heart Rate Range (THRR);Able to understand and use rate of perceived exertion (RPE) scale      Comments Christine Moore has completed 3 exercise sessions. She exercises for 15 min on the recumbent elliptical and treadmill. Christine Moore averages 3.8 METs at level 1 on the recumbent elliptical and  2.53 METs on the treadmill. She performs the warmup and cooldown standing without limitations. Christine Moore has  recently increased her workload for both exercise modes as she tolerates progressions well. She seems to enjoy rehab as she converses with other patients and has a positive attitude. Will continue to monitor and progress as able. Christine Moore has completed 13 exercise sessions. She exercises for 15 min on the recumbent elliptical and treadmill. Christine Moore averages 3.8 METs at level 1 on the recumbent elliptical and 2.53 METs on the treadmill. She performs the warmup and cooldown standing without limitations. Christine Moore has been regressed due to her exercise blood pressure as this has made it difficult to increase workload. Blood pressure has remained in a normal range but she has not returned to her originial workload. Will continue to monitor and progress as able.      Expected Outcomes Through exercise at rehab and at home, the patient will decrease shortness of breath with daily activities and feel confident in carrying out an exercise regime at home. Through exercise at rehab and at home, the patient will decrease shortness of breath with daily activities and feel confident in carrying out an exercise regime at home.               Discharge Exercise Prescription (Final Exercise Prescription Changes):  Exercise Prescription Changes - 04/14/23 1200       Response to Exercise   Blood Pressure (Admit) 142/60    Blood Pressure (Exercise) 164/70    Blood Pressure (Exit) 120/60    Heart Rate (Admit) 68 bpm    Heart Rate (Exercise) 83 bpm    Heart Rate (Exit) 66 bpm    Oxygen Saturation (Admit) 95 %    Oxygen Saturation (Exercise) 95 %    Oxygen Saturation (Exit) 94 %    Rating of Perceived Exertion (Exercise) 11    Perceived Dyspnea (Exercise) 1    Duration Continue with 30 min of aerobic exercise without signs/symptoms of physical distress.    Intensity THRR unchanged      Progression   Progression Continue to progress workloads to maintain intensity without signs/symptoms of physical distress.       Resistance Training   Training Prescription Yes    Weight blue bands    Reps 10-15    Time 10 Minutes      Treadmill   MPH 2.2    Grade 1.5    Minutes 15    METs 3.14      Recumbant Elliptical   Level 2    Minutes 15    METs 4.2             Nutrition:  Target Goals: Understanding of nutrition guidelines, daily intake of sodium 1500mg , cholesterol 200mg , calories 30% from fat and 7% or less from saturated fats, daily to have 5 or more servings of fruits and vegetables.  Biometrics:    Nutrition Therapy Plan and Nutrition Goals:  Nutrition Therapy & Goals - 03/24/23 1149       Nutrition Therapy   Diet Heart Healthy Diet    Drug/Food Interactions Statins/Certain Fruits      Personal Nutrition Goals   Nutrition Goal Patient to improve diet quality by using the plate method as a guide for meal planning to include lean protein/plant protein, fruits, vegetables, whole grains, nonfat dairy as part of a well-balanced diet.    Comments Goals in progress. Christine Moore reports eating three meals per day and eating a wide variety of foods including  lean protein, fruits and vegetables. She seldom eats out or fries foods. She reports overeating salt and sweets; however, she is not receptive to cutting back on these things at this time. Encouraged patient to priortize high potassium/high fiber foods including fruits and vegetables; she remains receptive to this. She has maintained her weight since starting with our program. Christine Moore will continue to benefit from participation in pulmonary rehab for nutrition, exercise, and lifestyle modification.      Intervention Plan   Intervention Prescribe, educate and counsel regarding individualized specific dietary modifications aiming towards targeted core components such as weight, hypertension, lipid management, diabetes, heart failure and other comorbidities.;Nutrition handout(s) given to patient.    Expected Outcomes Short Term Goal: Understand  basic principles of dietary content, such as calories, fat, sodium, cholesterol and nutrients.;Long Term Goal: Adherence to prescribed nutrition plan.             Nutrition Assessments:  Nutrition Assessments - 02/24/23 1412       Rate Your Plate Scores   Pre Score 60            MEDIFICTS Score Key: ?70 Need to make dietary changes  40-70 Heart Healthy Diet ? 40 Therapeutic Level Cholesterol Diet  Flowsheet Row PULMONARY REHAB CHRONIC OBSTRUCTIVE PULMONARY DISEASE from 02/24/2023 in [facility] Heart, Vascular, & Lung Health  Picture Your Plate Total Score on Admission 60      Picture Your Plate Scores: <16 Unhealthy dietary pattern with much room for improvement. 41-50 Dietary pattern unlikely to meet recommendations for good health and room for improvement. 51-60 More healthful dietary pattern, with some room for improvement.  >60 Healthy dietary pattern, although there may be some specific behaviors that could be improved.    Nutrition Goals Re-Evaluation:  Nutrition Goals Re-Evaluation     Row Name 02/24/23 1401 03/24/23 1149           Goals   Current Weight 172 lb 13.5 oz (78.4 kg) 173 lb 11.6 oz (78.8 kg)      Comment -- no new labs; most recent labs lipids WNL      Expected Outcome Christine Moore reports eating three meals per day and eating a wide variety of foods including lean protein, fruits and vegetables. She seldom eats out or fries foods. She reports overeating salt and sweets; however, she is not receptive to cutting back on these things at this time. Encouraged patient to priortize high potassium/high fiber foods including fruits and vegetables. Sia will continue to benefit from participation in pulmonary rehab for nutrition, exercise, and lifestyle modification. Goals in progress. Christine Moore reports eating three meals per day and eating a wide variety of foods including lean protein, fruits and vegetables. She seldom eats out or  fries foods. She reports overeating salt and sweets; however, she is not receptive to cutting back on these things at this time. Encouraged patient to priortize high potassium/high fiber foods including fruits and vegetables; she remains receptive to this. She has maintained her weight since starting with our program. Christine Moore will continue to benefit from participation in pulmonary rehab for nutrition, exercise, and lifestyle modification.               Nutrition Goals Discharge (Final Nutrition Goals Re-Evaluation):  Nutrition Goals Re-Evaluation - 03/24/23 1149       Goals   Current Weight 173 lb 11.6 oz (78.8 kg)    Comment no new labs; most recent labs lipids WNL    Expected  Outcome Goals in progress. Christine Moore reports eating three meals per day and eating a wide variety of foods including lean protein, fruits and vegetables. She seldom eats out or fries foods. She reports overeating salt and sweets; however, she is not receptive to cutting back on these things at this time. Encouraged patient to priortize high potassium/high fiber foods including fruits and vegetables; she remains receptive to this. She has maintained her weight since starting with our program. Christine Moore will continue to benefit from participation in pulmonary rehab for nutrition, exercise, and lifestyle modification.             Psychosocial: Target Goals: Acknowledge presence or absence of significant depression and/or stress, maximize coping skills, provide positive support system. Participant is able to verbalize types and ability to use techniques and skills needed for reducing stress and depression.  Initial Review & Psychosocial Screening:  Initial Psych Review & Screening - 02/16/23 1106       Barriers   Psychosocial barriers to participate in program --      Screening Interventions   Interventions --             Quality of Life Scores:  Scores of 19 and below usually indicate a poorer quality of  life in these areas.  A difference of  2-3 points is a clinically meaningful difference.  A difference of 2-3 points in the total score of the Quality of Life Index has been associated with significant improvement in overall quality of life, self-image, physical symptoms, and general health in studies assessing change in quality of life.  PHQ-9: Review Flowsheet       02/16/2023 09/23/2021  Depression screen PHQ 2/9  Decreased Interest 0 0  Down, Depressed, Hopeless 1 0  PHQ - 2 Score 1 0  Altered sleeping 1 1  Tired, decreased energy 1 3  Change in appetite 2 3  Feeling bad or failure about yourself  0 0  Trouble concentrating 1 1  Moving slowly or fidgety/restless 0 0  Suicidal thoughts 0 0  PHQ-9 Score 6 8  Difficult doing work/chores Somewhat difficult -   Interpretation of Total Score  Total Score Depression Severity:  1-4 = Minimal depression, 5-9 = Mild depression, 10-14 = Moderate depression, 15-19 = Moderately severe depression, 20-27 = Severe depression   Psychosocial Evaluation and Intervention:  Psychosocial Evaluation - 02/16/23 1056       Psychosocial Evaluation & Interventions   Interventions Relaxation education;Encouraged to exercise with the program and follow exercise prescription    Comments Pt is depressed and stressed about her sister's health. She stated she has a hard time sleeping because she is up worrying or thinking about things. She doesn't like to drive far distances, also concerned when she goes in/out of [employer] and/or [employer]    Expected Outcomes For Kadijah to participated in [place] without any psychosocial barriers or concerns    Continue Psychosocial Services  Follow up required by staff   Will continue and monitor Christine Moore for any psychosocial needs that may arise            Psychosocial Re-Evaluation:  Psychosocial Re-Evaluation     Row Name 03/04/23 0939 04/06/23 1113           Psychosocial Re-Evaluation   Current issues with Current  Depression;Current Sleep Concerns;Current Stress Concerns Current Depression;Current Sleep Concerns;Current Stress Concerns      Comments No change in psychosocial assessment since 02/24/23. Noha still acknowledges the above issues with no change  noted. Maraki is still stressed and depressed about her sister's diagnosis. She is still having a hard time falling asleep at night. Cohen still declines a referral to a therapist. Tishanna has been encouraged to speak to her PCP about her mental health. Resources have been provided to [facility] for stress management and better sleep.      Expected Outcomes For Christine Moore to see a therapist about her sister's terminal diagnosis. She currently declines a referral at this time. Christine Moore also declines to speak to her PCP about possible medications. For Christine Moore to attend [facility] and to use positive coping skills to mange her stress and depression. We would also like for Christine Moore to get better sleep through a bedtime routine.      Interventions Stress management education;Encouraged to attend Pulmonary Rehabilitation for the exercise Stress management education;Encouraged to attend Pulmonary Rehabilitation for the exercise      Continue Psychosocial Services  Follow up required by staff  We will continue to monitor and assess Sangeeta's mental health while in the program Follow up required by staff  We will continue to monitor and assess Deshia's mental health while in the program               Psychosocial Discharge (Final Psychosocial Re-Evaluation):  Psychosocial Re-Evaluation - 04/06/23 1113       Psychosocial Re-Evaluation   Current issues with Current Depression;Current Sleep Concerns;Current Stress Concerns    Comments Christine Moore is still stressed and depressed about her sister's diagnosis. She is still having a hard time falling asleep at night. Christine Moore still declines a referral to a therapist. Christine Moore has been encouraged to speak to her PCP about her  mental health. Resources have been provided to [place] for stress management and better sleep.    Expected Outcomes For Christine Moore to attend [facility] and to use positive coping skills to mange her stress and depression. We would also like for Christine Moore to get better sleep through a bedtime routine.    Interventions Stress management education;Encouraged to attend Pulmonary Rehabilitation for the exercise    Continue Psychosocial Services  Follow up required by staff   We will continue to monitor and assess Cuma's mental health while in the program            Education: Education Goals: Education classes will be provided on a weekly basis, covering required topics. Participant will state understanding/return demonstration of topics presented.  Learning Barriers/Preferences:  Learning Barriers/Preferences - 02/16/23 1056       Learning Barriers/Preferences   Learning Barriers Hearing;Sight    Learning Preferences Written Material;Group Instruction;Individual Instruction             Education Topics: Introduction to Pulmonary Rehab Group instruction provided by PowerPoint, verbal discussion, and written material to support subject matter. Instructor reviews what Pulmonary Rehab is, the purpose of the program, and how patients are referred.     Know Your Numbers Group instruction that is supported by a PowerPoint presentation. Instructor discusses importance of knowing and understanding resting, exercise, and post-exercise oxygen saturation, heart rate, and blood pressure. Oxygen saturation, heart rate, blood pressure, rating of perceived exertion, and dyspnea are reviewed along with a normal range for these values.  Flowsheet Row PULMONARY REHAB CHRONIC OBSTRUCTIVE PULMONARY DISEASE from 03/12/2023 in [facility] for Heart, Vascular, & Lung Health  Date 03/12/23  Educator EP  Instruction Review Code 1- Verbalizes Understanding       Exercise for the  Pulmonary Patient Group instruction  that is supported by a PowerPoint presentation. Instructor discusses benefits of exercise, core components of exercise, frequency, duration, and intensity of an exercise routine, importance of utilizing pulse oximetry during exercise, safety while exercising, and options of places to exercise outside of rehab.  Flowsheet Row PULMONARY REHAB CHRONIC OBSTRUCTIVE PULMONARY DISEASE from 03/05/2023 in [facility] for Heart, Vascular, & Lung Health  Date 03/05/23  Educator EP  Instruction Review Code 1- Verbalizes Understanding          MET Level  Group instruction provided by PowerPoint, verbal discussion, and written material to support subject matter. Instructor reviews what METs are and how to increase METs.  Flowsheet Row PULMONARY REHAB CHRONIC OBSTRUCTIVE PULMONARY DISEASE from 04/02/2023 in [facility] for Heart, Vascular, & Lung Health  Date 04/02/23  Educator EP  Instruction Review Code 1- Verbalizes Understanding       Pulmonary Medications Verbally interactive group education provided by instructor with focus on inhaled medications and proper administration. Flowsheet Row PULMONARY REHAB CHRONIC OBSTRUCTIVE PULMONARY DISEASE from 02/26/2023 in [facility] for Heart, Vascular, & Lung Health  Date 02/26/23  Educator RT  Instruction Review Code 1- Verbalizes Understanding       Anatomy and Physiology of the Respiratory System Group instruction provided by PowerPoint, verbal discussion, and written material to support subject matter. Instructor reviews respiratory cycle and anatomical components of the respiratory system and their functions. Instructor also reviews differences in obstructive and restrictive respiratory diseases with examples of each.    Oxygen Safety Group instruction provided by PowerPoint, verbal discussion, and written material to support subject matter.  There is an overview of "What is Oxygen" and "Why do we need it".  Instructor also reviews how to create a safe environment for oxygen use, the importance of using oxygen as prescribed, and the risks of noncompliance. There is a brief discussion on traveling with oxygen and resources the patient may utilize. Flowsheet Row PULMONARY REHAB CHRONIC OBSTRUCTIVE PULMONARY DISEASE from 03/19/2023 in [facility] for Heart, Vascular, & Lung Health  Date 03/19/23  Educator RN  Instruction Review Code 1- Verbalizes Understanding       Oxygen Use Group instruction provided by PowerPoint, verbal discussion, and written material to discuss how supplemental oxygen is prescribed and different types of oxygen supply systems. Resources for more information are provided.  Flowsheet Row PULMONARY REHAB CHRONIC OBSTRUCTIVE PULMONARY DISEASE from 03/26/2023 in [facility] for Heart, Vascular, & Lung Health  Date 03/26/23  Educator RT  Instruction Review Code 1- Verbalizes Understanding       Breathing Techniques Group instruction that is supported by demonstration and informational handouts. Instructor discusses the benefits of pursed lip and diaphragmatic breathing and detailed demonstration on how to perform both.     Risk Factor Reduction Group instruction that is supported by a PowerPoint presentation. Instructor discusses the definition of a risk factor, different risk factors for pulmonary disease, and how the heart and lungs work together.   MD Day A group question and answer session with a medical doctor that allows participants to ask questions that relate to their pulmonary disease state.   Nutrition for the Pulmonary Patient Group instruction provided by PowerPoint slides, verbal discussion, and written materials to support subject matter. The instructor gives an explanation and review of healthy diet recommendations, which includes a discussion on  weight management, recommendations for fruit and vegetable consumption, as well as protein, fluid,  caffeine, fiber, sodium, sugar, and alcohol. Tips for eating when patients are short of breath are discussed.    Other Education Group or individual verbal, written, or video instructions that support the educational goals of the pulmonary rehab program.    Knowledge Questionnaire Score:  Knowledge Questionnaire Score - 02/16/23 1132       Knowledge Questionnaire Score   Pre Score 17/18             Core Components/Risk Factors/Patient Goals at Admission:  Personal Goals and Risk Factors at Admission - 02/16/23 1057       Core Components/Risk Factors/Patient Goals on Admission    Weight Management Weight Loss    Improve shortness of breath with ADL's Yes    Intervention Provide education, individualized exercise plan and daily activity instruction to help decrease symptoms of SOB with activities of daily living.    Expected Outcomes Short Term: Improve cardiorespiratory fitness to achieve a reduction of symptoms when performing ADLs;Long Term: Be able to perform more ADLs without symptoms or delay the onset of symptoms    Increase knowledge of respiratory medications and ability to use respiratory devices properly  Yes    Intervention Provide education and demonstration as needed of appropriate use of medications, inhalers, and oxygen therapy.    Expected Outcomes Short Term: Achieves understanding of medications use. Understands that oxygen is a medication prescribed by physician. Demonstrates appropriate use of inhaler and oxygen therapy.;Long Term: Maintain appropriate use of medications, inhalers, and oxygen therapy.    Stress Yes    Intervention Offer individual and/or small group education and counseling on adjustment to heart disease, stress management and health-related lifestyle change. Teach and support self-help strategies.;Refer participants experiencing significant  psychosocial distress to appropriate mental health specialists for further evaluation and treatment. When possible, include family members and significant others in education/counseling sessions.    Expected Outcomes Short Term: Participant demonstrates changes in health-related behavior, relaxation and other stress management skills, ability to obtain effective social support, and compliance with psychotropic medications if prescribed.;Long Term: Emotional wellbeing is indicated by absence of clinically significant psychosocial distress or social isolation.             Core Components/Risk Factors/Patient Goals Review:   Goals and Risk Factor Review     Row Name 03/04/23 0944 04/06/23 1245           Core Components/Risk Factors/Patient Goals Review   Personal Goals Review Other;Improve shortness of breath with ADL's;Develop more efficient breathing techniques such as purse lipped breathing and diaphragmatic breathing and practicing self-pacing with activity.;Increase knowledge of respiratory medications and ability to use respiratory devices properly.;Stress  Weight loss Improve shortness of breath with ADL's;Develop more efficient breathing techniques such as purse lipped breathing and diaphragmatic breathing and practicing self-pacing with activity.;Increase knowledge of respiratory medications and ability to use respiratory devices properly.;Stress;Weight Management/Obesity      Review Christine Moore has completed 3 PR sessions so far. She is currently exercising on the treadmill and recumbent elliptical. She has increased her workload on the recumbent elliptical and increased her speed on the treadmill. Her oxygen saturations have been stable on room air. She is practicing pursed lip breathing. She knows how to report her Rate of Perceived Exertion and her Dyspnea scale to staff. We are hoping exercise will decrease her stress level. Christine Moore has attended the respiratory medication education class.  It is too soon to see any progress in weight loss. She is working with our dietician. We will continue  to assess any progress. Sakhia hasn't made any progress on her goal of losing weight. She has been working with our dietitian. (See dietician notes) She has met her goal of increasing her knowledge of respiratory meds and using them properly. She has correctly stated when to use her inhaler and has properly demonstrated it with our respiratory therapist. She has also met her goal of developing more efficient breathing techniques and self-pacing. Cazandra can initiate pursed lip breathing when she is short of breath. Priti has been resistant to reaching out for professional mental help for her stress. Resources have been provided.      Expected Outcomes See admission goals See admission goals               Core Components/Risk Factors/Patient Goals at Discharge (Final Review):   Goals and Risk Factor Review - 04/06/23 1245       Core Components/Risk Factors/Patient Goals Review   Personal Goals Review Improve shortness of breath with ADL's;Develop more efficient breathing techniques such as purse lipped breathing and diaphragmatic breathing and practicing self-pacing with activity.;Increase knowledge of respiratory medications and ability to use respiratory devices properly.;Stress;Weight Management/Obesity    Review Shaian hasn't made any progress on her goal of losing weight. She has been working with our dietitian. (See dietician notes) She has met her goal of increasing her knowledge of respiratory meds and using them properly. She has correctly stated when to use her inhaler and has properly demonstrated it with our respiratory therapist. She has also met her goal of developing more efficient breathing techniques and self-pacing. Dafna can initiate pursed lip breathing when she is short of breath. Merrilee has been resistant to reaching out for professional mental help for her stress.  Resources have been provided.    Expected Outcomes See admission goals             ITP Comments:Pt is making expected progress toward Pulmonary Rehab goals after completing 15 sessions. Recommend continued exercise, life style modification, education, and utilization of breathing techniques to increase stamina and strength, while also decreasing shortness of breath with exertion.  Dr. Mechele Collin is Medical Director for Pulmonary Rehab at [facility].     Comments: Dr. Mechele Collin is Medical Director for Pulmonary Rehab at [facility].

## 2023-04-16 ENCOUNTER — Encounter (HOSPITAL_COMMUNITY)
Admission: RE | Admit: 2023-04-16 | Discharge: 2023-04-16 | Disposition: A | Discharge status: Home / Self Care | Payer: Medicare Other | Source: Ambulatory Visit | Attending: Pulmonary Disease | Admitting: Pulmonary Disease

## 2023-04-16 DIAGNOSIS — J449 Chronic obstructive pulmonary disease, unspecified: Secondary | ICD-10-CM

## 2023-04-16 NOTE — Progress Notes (Signed)
Daily Session Note  Patient Details  Name: Christine Moore MRN: [id number] Date of Birth: October 27, 1946 Referring Provider:   [name] Pulmonary Rehab Walk Test from 02/16/2023 in [facility] for Heart, Vascular, & Lung Health  Referring Provider [name]       Encounter Date: 04/16/2023  Check In:  Session Check In - 04/16/23 1201       Check-In   Supervising physician immediately available to respond to emergencies [facility] MD immediately available    Physician(s) Jari Favre, PA    Location [facility]    Staff Present Samantha Belarus, RD, Dutch Gray, RN, BSN;Randi Reeve BS, ACSM-CEP, Exercise Physiologist;Ruford Dudzinski Earlene Plater, MS, ACSM-CEP, Exercise Physiologist;Casey Katrinka Blazing, Pennelope Bracken, RN, BSN    Virtual Visit No    Medication changes reported     No    Fall or balance concerns reported    No    Tobacco Cessation No Change    Warm-up and Cool-down Performed as group-led instruction    Resistance Training Performed Yes    VAD Patient? No    PAD/SET Patient? No      Pain Assessment   Currently in Pain? No/denies    Multiple Pain Sites No             Capillary Blood Glucose: No results found for this or any previous visit (from the past 24 hour(s)).    Social History   Tobacco Use  Smoking Status Former   Packs/day: 1.00   Years: 22.00   Additional pack years: 0.00   Total pack years: 22.00   Types: Cigarettes   Start date: 1963   Quit date: 1985   Years since quitting: 39.3  Smokeless Tobacco Never    Goals Met:  Proper associated with RPD/PD & O2 Sat Independence with exercise equipment Exercise tolerated well No report of concerns or symptoms today Strength training completed today  Goals Unmet:  Not Applicable  Comments: Service time is from 1011 to 1150.    Dr. Mechele Collin is Medical Director for Pulmonary Rehab at [facility].

## 2023-04-21 ENCOUNTER — Encounter (HOSPITAL_COMMUNITY)
Admission: RE | Admit: 2023-04-21 | Discharge: 2023-04-21 | Disposition: A | Discharge status: Home / Self Care | Payer: Medicare Other | Source: Ambulatory Visit | Attending: Pulmonary Disease | Admitting: Pulmonary Disease

## 2023-04-21 DIAGNOSIS — J449 Chronic obstructive pulmonary disease, unspecified: Secondary | ICD-10-CM | POA: Diagnosis not present

## 2023-04-21 NOTE — Progress Notes (Signed)
Daily Session Note  Patient Details  Name: Christine Moore MRN: [id number] Date of Birth: 10-07-1946 Referring Provider:   [name] Pulmonary Rehab Walk Test from 02/16/2023 in [facility] for Heart, Vascular, & Lung Health  Referring Provider [name]       Encounter Date: 04/21/2023  Check In:  Session Check In - 04/21/23 1205       Check-In   Supervising physician immediately available to respond to emergencies [facility] immediately available    Physician(s) Bernadene Person, NP    [facility]    Staff Present Essie Hart, RN, BSN;Randi Idelle Crouch BS, ACSM-CEP, Exercise Physiologist;Jessicamarie Amiri Earlene Plater, MS, ACSM-CEP, Exercise Physiologist;Casey Marcille Buffy, RN, BSN    Virtual Visit No    Medication changes reported     No    Fall or balance concerns reported    No    Tobacco Cessation No Change    Warm-up and Cool-down Performed as group-led instruction    Resistance Training Performed Yes    VAD Patient? No    PAD/SET Patient? No      Pain Assessment   Currently in Pain? No/denies    Multiple Pain Sites No             Capillary Blood Glucose: No results found for this or any previous visit (from the past 24 hour(s)).    Social History   Tobacco Use  Smoking Status Former   Packs/day: 1.00   Years: 22.00   Additional pack years: 0.00   Total pack years: 22.00   Types: Cigarettes   Start date: 1963   Quit date: 1985   Years since quitting: 39.4  Smokeless Tobacco Never    Goals Met:  Proper associated with RPD/PD & O2 Sat Independence with exercise equipment Exercise tolerated well No report of concerns or symptoms today Strength training completed today  Goals Unmet:  Not Applicable  Comments: Service time is from 1007 to 1147.    Dr. Mechele Collin is Medical Director for Pulmonary Rehab at [facility].

## 2023-04-21 NOTE — Progress Notes (Signed)
Home Exercise Prescription I have reviewed a Home Exercise Prescription with Christine Moore. Christine Moore is not currently exercising at home. I encouraged her to exercise 3-5 days/wk for 30 min/day. She seemed hesistant to the frequency, but I reminded her that her graduation date is coming up. The increase in frequency is in consideration of her graduation day. She understood and agreed to try to start exercising at home. I am unsure how motivated Christine Moore is to exercise on her own. She did request a referral to the [facility] PREP program. Will put in a [facility] PREP referral. The patient stated that their goals were to reduce coughing and feel better. We reviewed exercise guidelines, target heart rate during exercise, RPE Scale, weather conditions, endpoints for exercise, warmup and cool down. The patient is encouraged to come to me with any questions. I will continue to follow up with the patient to assist them with progression and safety. Spent 15 min with patient discussing home exercise plan and goals  Joya San, MS, ACSM-CEP 04/21/2023 3:55 PM

## 2023-04-22 ENCOUNTER — Ambulatory Visit (HOSPITAL_BASED_OUTPATIENT_CLINIC_OR_DEPARTMENT_OTHER): Payer: Medicare Other | Admitting: Pulmonary Disease

## 2023-04-22 ENCOUNTER — Encounter (HOSPITAL_BASED_OUTPATIENT_CLINIC_OR_DEPARTMENT_OTHER): Payer: Self-pay | Admitting: Pulmonary Disease

## 2023-04-22 VITALS — BP 154/68 | HR 66 | Temp 98.2°F | Ht 69.0 in | Wt 174.0 lb

## 2023-04-22 DIAGNOSIS — J4489 Other specified chronic obstructive pulmonary disease: Secondary | ICD-10-CM | POA: Diagnosis not present

## 2023-04-22 NOTE — Patient Instructions (Addendum)
COPD-asthma overlap  --CONTINUE Trelegy 200-62.5-25 mcg ONE puff ONCE a day. --Re-ORDER flutter valve twice a day AS NEEDED --CONTINUE Xopenex nebulizers twice a day followed by flutter valve. REFILL --CONTINUE singulair 10 mg daily --Complete pulmonary rehab. Agree with starting [employer]. Consider starting exercise program at [facility] in the future --Provided PFT results. Reviewed and consistent with COPD/asthma

## 2023-04-22 NOTE — Progress Notes (Signed)
Subjective:   PATIENT ID: Christine Moore GENDER: female DOB: Apr 09, 1946, MRN: [id number]   HPI  Chief Complaint  Patient presents with   Follow-up    Follow up. Patient states things are going well but she has some things she wants to discuss with the doctor.    Reason for Visit: Follow-up COPD/asthma  Christine Moore is a 77 year old female with asthma, atrial fibrillation s/p ablations and cardioversions, HTN, HLD and chronic headaches who presents for follow-up asthma/COPD management.  Synopsis: She was diagnosed with asthma as a teen and COPD in 2019. She is on Trelegy, xopenex and Singulair. She reports that she has had pneumonia four times from Aug 2021- Aug 2022. Has persistent productive cough that previously interrupted her ability to speak and be in public.   2022 Aug - Established care with [facility]. Exacerbation in 10/2021 2023 Exacerbation in Jan and May. ED visit in December for CAP c/b atrial fib. Using flutter valve and nebs.   01/22/23 Since our last visit she was treated for exacerbation in May. She has been to the ED with for atrial fibrillation, triggered by URI. She wishes to better control her lung issues to minimize her heart issues. She is compliant with her Trelegy daily and has been needing nebulizer daily. She reports sometimes a productive cough. Has sensation of mucous stuck in her throat. Denies fevers or chills. No using mucinex  04/22/23 Since our last visit she is overall doing well. Does still have cough during the day during church and will keep her up at night. Uses xopenex four times a week. Does have nasal congestion. Does not use flonase due to loss of smell. Has taken PPI for >2 months. Has not tried any OTC cough syrups. Has been participating pulmonary rehab and feels more fit. She is near graduation and looking for options after this. Considering [facility]. Reports no respiratory illness since Dec 2023 and attributes this to  exercising.  Asthma Control Test ACT Total Score  02/06/2022 10:08 AM 22   Social History: Former smoker. Smoked 1 ppd x 10 years. Quit in 1985. Retired Runner, broadcasting/film/video Significant second smoke exposure Mom had asthma Moving in with her sister  Past Medical History:  Diagnosis Date   Atrial fibrillation (HCC)    Atrial flutter (HCC)    COPD (chronic obstructive pulmonary disease) (HCC)    Eczema    Hyperlipidemia    Hypothyroid    Urinary incontinence      Family History  Problem Relation Age of Onset   Melanoma Sister    Breast cancer Sister 4   Endometrial cancer Sister      Social History   Occupational History   Not on file  Tobacco Use   Smoking status: Former    Packs/day: 1.00    Years: 22.00    Additional pack years: 0.00    Total pack years: 22.00    Types: Cigarettes    Start date: 1963    Quit date: 1985    Years since quitting: 39.4   Smokeless tobacco: Never  Vaping Use   Vaping Use: Never used  Substance and Sexual Activity   Alcohol use: Yes    Comment: drinks 1 drink per day of all the above   Drug use: Never   Sexual activity: Not Currently    Allergies  Allergen Reactions   Penicillins     Unknown reaction    Quinapril Swelling    Swelling of  face    Latex Itching   Tessalon [Benzonatate] Swelling    Lip swelling. No throat swelling. Improved after stopping and taking benadryl   Formaldehyde Itching     Outpatient Medications Prior to Visit  Medication Sig Dispense Refill   acyclovir (ZOVIRAX) 400 MG tablet Take 400 mg by mouth 3 (three) times daily as needed (fever blisters).     atorvastatin (LIPITOR) 10 MG tablet Take 10 mg by mouth at bedtime.     chlorpheniramine (CHLOR-TRIMETON) 4 MG tablet Take 4 mg by mouth See admin instructions. Take 4 mg twice daily, may increase to 4 times daily as needed for allergies     Cholecalciferol (VITAMIN D) 50 MCG (2000 UT) tablet Take 2,000 Units by mouth 2 (two) times daily.     clobetasol  ointment (TEMOVATE) 0.05 % Apply 1 application  topically daily as needed (eczema).     diltiazem (CARDIZEM CD) 240 MG 24 hr capsule Take 1 capsule (240 mg total) by mouth daily. (Patient taking differently: Take 240 mg by mouth daily. Pt stated they increased her dose to 240mg  when cards D/Cd her Multaq) 90 capsule 3   ELIQUIS 5 MG TABS tablet Take 1 tablet (5 mg total) by mouth 2 (two) times daily. 60 tablet 5   Fluticasone-Umeclidin-Vilant (TRELEGY ELLIPTA) 200-62.5-25 MCG/INH AEPB Inhale 1 puff into the lungs daily.     hydrocortisone 2.5 % cream Apply 1 Application topically daily as needed (facial eczema).     Ketotifen Fumarate (ALAWAY OP) Place 1 drop into both eyes daily as needed (allergies).     levalbuterol (XOPENEX HFA) 45 MCG/ACT inhaler Inhale 2 puffs into the lungs 4 (four) times daily as needed for shortness of breath or wheezing. 1 each 5   levalbuterol (XOPENEX) 0.63 MG/3ML nebulizer solution Take 3 mLs (0.63 mg total) by nebulization every 6 (six) hours as needed for wheezing or shortness of breath. 360 mL 12   losartan (COZAAR) 50 MG tablet Take 1 tablet (50 mg total) by mouth 2 (two) times daily. 180 tablet 2   Lutein-Zeaxanthin 25-5 MG CAPS Take 1 capsule by mouth at bedtime.     montelukast (SINGULAIR) 10 MG tablet Take 10 mg by mouth at bedtime.     naproxen sodium (ALEVE) 220 MG tablet Take 220 mg by mouth daily as needed (headaches).     omeprazole (PRILOSEC OTC) 20 MG tablet Take 20 mg by mouth daily before breakfast.     OVER THE COUNTER MEDICATION Take 250 mg by mouth daily. Magnesium 250mg      OVER THE COUNTER MEDICATION Take 500 mcg by mouth daily. Vitamin B12     Probiotic Product (PROBIOTIC-10 PO) Take 1 tablet by mouth daily.     sodium chloride (OCEAN) 0.65 % SOLN nasal spray Place 1 spray into both nostrils as needed for congestion.     levothyroxine (SYNTHROID) 50 MCG tablet Take 50 mcg by mouth daily.     No facility-administered medications prior to visit.     Review of Systems  Constitutional:  Negative for chills, diaphoresis, fever, malaise/fatigue and weight loss.  HENT:  Positive for congestion.   Respiratory:  Negative for cough, hemoptysis, sputum production, shortness of breath and wheezing.   Cardiovascular:  Negative for chest pain, palpitations and leg swelling.     Objective:   Vitals:   04/22/23 0956  BP: (!) 154/68  Pulse: 66  Temp: 98.2 F (36.8 C)  TempSrc: Oral  SpO2: 95%  Weight: 78.9 kg  Height: 5\' 9"  (1.753 m)  SpO2: 95 % O2 Device: None (Room air)  Physical Exam: General: Well-appearing, no acute distress HENT: [place], AT Eyes: EOMI, no scleral icterus Respiratory: Clear to auscultation bilaterally.  No crackles, wheezing or rales Cardiovascular: RRR, -M/R/G, no JVD Extremities:-Edema,-tenderness Neuro: AAO x4, CNII-XII grossly intact Psych: Normal mood, normal affect  Data Reviewed:  Imaging: CTA 05/08/21 - No pulmonary embolus, patchy bilateral lower lobe airspace consolidation left> right, 4 mm right upper lobe perifissural nodule CXR 12/24/2021-right perihilar pneumonia  PFT: 01/26/23 FVC 2.41 (72%) FEV1 1.53 (61%) Ratio 64  TLC 121% TV 178% RV/TLC 141% DLCO 101% Interpretation: Moderate obstructive defect with significant bronchodilator response in FVC. Hyperinflation and air trapping present.  Labs: Alpha 1-antitrypsin MZ - 91 (LLN 101) Immunoglobulins G/A/M - normal range Eosinophil 05/22/21 - 0    Assessment & Plan:   Discussion: 77 year old female former smoker with COPD-asthma overlap and recurrent exacerbations +/- pneumonia at least 2-3 times annually. Last treated for pneumonia 10/2022. Discussed clinical course and management of COPD/asthma including bronchodilator regimen, preventive care including vaccinations and action plan for exacerbation.  COPD-asthma overlap  --CONTINUE Trelegy 200-62.5-25 mcg ONE puff ONCE a day. --Re-ORDER flutter valve twice a day AS NEEDED --CONTINUE  Xopenex nebulizers twice a day followed by flutter valve. REFILL --CONTINUE singulair 10 mg daily --Complete pulmonary rehab  Hx recurrent pneumonia Dense consolidation in LLL. Likely area high risk for recurrent pneumonia --In the future if you develop s/sx concerning for pneumonia, will need two week course of antibiotics --Will consider bronchoscopy if symptoms persistent  **If needs CT need in the future, she has used alprazolam 1-2 mg PRN for anxiety**  Health Maintenance Immunization History  Administered Date(s) Administered   Influenza Inj Mdck Quad Pf 10/21/2013, 09/01/2019, 09/10/2020   Influenza Split 10/16/2004, 11/08/2009, 09/28/2013   Influenza, High Dose Seasonal PF 10/17/2016, 09/15/2017, 08/29/2021, 08/21/2022   Influenza, Seasonal, Injecte, Preservative Fre 09/23/2012   Influenza,inj,Quad PF,6+ Mos 09/18/2014, 09/21/2015   Influenza,inj,quad, With Preservative 08/31/2017, 10/07/2017, 09/07/2018, 09/02/2019   Influenza-Unspecified 10/21/2013   Moderna Sars-Covid-2 Vaccination 12/20/2019, 01/17/2020, 10/08/2020, 04/02/2021   PNEUMOCOCCAL CONJUGATE-20 02/19/2023   Pfizer Covid-19 Vaccine Bivalent Booster 75yrs & up 09/29/2021   Pneumococcal Conjugate-13 05/28/2015, 05/27/2016   Pneumococcal Polysaccharide-23 12/21/2008, 12/01/2013, 05/31/2014   Tdap 04/03/2017, 02/19/2023   Zoster, Live 12/01/2013   CT Lung Screen - discuss for CT  No orders of the defined types were placed in this encounter.  No orders of the defined types were placed in this encounter.  Return in about 3 months (around 07/23/2023).  I have spent a total time of 35-minutes on the day of the appointment including chart review, data review, collecting history, coordinating care and discussing medical diagnosis and plan with the patient/family. Past medical history, allergies, medications were reviewed. Pertinent imaging, labs and tests included in this note have been reviewed and interpreted  independently by me.  Atha Mcbain Mechele Collin, MD [facility] 04/22/2023 1:21 PM  Office Number [phone]

## 2023-04-23 ENCOUNTER — Encounter (HOSPITAL_COMMUNITY)
Admission: RE | Admit: 2023-04-23 | Discharge: 2023-04-23 | Disposition: A | Discharge status: Home / Self Care | Payer: Medicare Other | Source: Ambulatory Visit | Attending: Pulmonary Disease | Admitting: Pulmonary Disease

## 2023-04-23 DIAGNOSIS — J449 Chronic obstructive pulmonary disease, unspecified: Secondary | ICD-10-CM

## 2023-04-23 NOTE — Progress Notes (Signed)
Daily Session Note  Patient Details  Name: Christine Moore MRN: [id number] Date of Birth: 24-Apr-1946 Referring Provider:   [name] Pulmonary Rehab Walk Test from 02/16/2023 in [facility] for Heart, Vascular, & Lung Health  Referring Provider [name]       Encounter Date: 04/23/2023  Check In:  Session Check In - 04/23/23 1421       Check-In   Supervising physician immediately available to respond to emergencies [facility] MD immediately available    Physician(s) [name], NP    [facility]    Staff Present Essie Hart, RN, BSN;Randi Idelle Crouch BS, ACSM-CEP, Exercise Physiologist;Kaylee Earlene Plater, MS, ACSM-CEP, Exercise Physiologist;Ji Feldner Marcille Buffy, RN, BSN    Virtual Visit No    Medication changes reported     No    Fall or balance concerns reported    No    Tobacco Cessation No Change    Warm-up and Cool-down Performed as group-led instruction    Resistance Training Performed Yes    VAD Patient? No    PAD/SET Patient? No      Pain Assessment   Currently in Pain? No/denies    Multiple Pain Sites No             Capillary Blood Glucose: No results found for this or any previous visit (from the past 24 hour(s)).    Social History   Tobacco Use  Smoking Status Former   Packs/day: 1.00   Years: 22.00   Additional pack years: 0.00   Total pack years: 22.00   Types: Cigarettes   Start date: 1963   Quit date: 1985   Years since quitting: 39.4  Smokeless Tobacco Never    Goals Met:  Proper associated with RPD/PD & O2 Sat Independence with exercise equipment Exercise tolerated well No report of concerns or symptoms today Strength training completed today  Goals Unmet:  Not Applicable  Comments: Service time is from 1011 to 1200.    Dr. Mechele Collin is Medical Director for Pulmonary Rehab at [facility].

## 2023-04-28 ENCOUNTER — Encounter (HOSPITAL_COMMUNITY)
Admission: RE | Admit: 2023-04-28 | Discharge: 2023-04-28 | Disposition: A | Discharge status: Home / Self Care | Payer: Medicare Other | Source: Ambulatory Visit | Attending: Pulmonary Disease | Admitting: Pulmonary Disease

## 2023-04-28 VITALS — Wt 174.8 lb

## 2023-04-28 DIAGNOSIS — J449 Chronic obstructive pulmonary disease, unspecified: Secondary | ICD-10-CM

## 2023-04-28 NOTE — Progress Notes (Signed)
Daily Session Note  Patient Details  Name: Christine Moore MRN: [id number] Date of Birth: 1946/05/17 Referring Provider:   [name] Pulmonary Rehab Walk Test from 02/16/2023 in [facility] for Heart, Vascular, & Lung Health  Referring Provider [name]       Encounter Date: 04/28/2023  Check In:  Session Check In - 04/28/23 1157       Check-In   Supervising physician immediately available to respond to emergencies [facility] MD immediately available    Physician(s) [name], NP    [facility]    Staff Present Essie Hart, RN, BSN;Randi Idelle Crouch BS, ACSM-CEP, Exercise Physiologist;Kaylee Earlene Plater, MS, ACSM-CEP, Exercise Physiologist;Antiono Ettinger Marcille Buffy, RN, BSN    Virtual Visit No    Medication changes reported     No    Fall or balance concerns reported    No    Tobacco Cessation No Change    Warm-up and Cool-down Performed as group-led instruction    Resistance Training Performed Yes    VAD Patient? No    PAD/SET Patient? No      Pain Assessment   Currently in Pain? No/denies    Multiple Pain Sites No             Capillary Blood Glucose: No results found for this or any previous visit (from the past 24 hour(s)).   Exercise Prescription Changes - 04/28/23 1200       Response to Exercise   Blood Pressure (Admit) 140/60    Blood Pressure (Exercise) 166/82    Blood Pressure (Exit) 136/70    Heart Rate (Admit) 63 bpm    Heart Rate (Exercise) 77 bpm    Heart Rate (Exit) 69 bpm    Oxygen Saturation (Admit) 95 %    Oxygen Saturation (Exercise) 94 %    Oxygen Saturation (Exit) 94 %    Rating of Perceived Exertion (Exercise) 11    Perceived Dyspnea (Exercise) 0    Duration Continue with 30 min of aerobic exercise without signs/symptoms of physical distress.    Intensity THRR unchanged      Progression   Progression Continue to progress workloads to maintain intensity without signs/symptoms of physical  distress.      Resistance Training   Training Prescription Yes    Weight blue bands    Reps 10-15    Time 10 Minutes      Treadmill   MPH 2.2    Grade 1.5    Minutes 15    METs 3.14      Recumbant Elliptical   Level 2    Minutes 15    METs 4.8             Social History   Tobacco Use  Smoking Status Former   Packs/day: 1.00   Years: 22.00   Additional pack years: 0.00   Total pack years: 22.00   Types: Cigarettes   Start date: 1963   Quit date: 1985   Years since quitting: 39.4  Smokeless Tobacco Never    Goals Met:  Proper associated with RPD/PD & O2 Sat Independence with exercise equipment Exercise tolerated well No report of concerns or symptoms today Strength training completed today  Goals Unmet:  Not Applicable  Comments: Service time is from 1012 to 1140.    Dr. Mechele Collin is Medical Director for Pulmonary Rehab at [facility].

## 2023-04-30 ENCOUNTER — Encounter (HOSPITAL_COMMUNITY)
Admission: RE | Admit: 2023-04-30 | Discharge: 2023-04-30 | Disposition: A | Discharge status: Home / Self Care | Payer: Medicare Other | Source: Ambulatory Visit | Attending: Pulmonary Disease | Admitting: Pulmonary Disease

## 2023-04-30 DIAGNOSIS — J449 Chronic obstructive pulmonary disease, unspecified: Secondary | ICD-10-CM | POA: Diagnosis not present

## 2023-04-30 NOTE — Progress Notes (Addendum)
Daily Session Note  Patient Details  Name: Christine Moore MRN: [id number] Date of Birth: 1946-02-21 Referring Provider:   [name] Pulmonary Rehab Walk Test from 02/16/2023 in [facility] for Heart, Vascular, & Lung Health  Referring Provider [name]       Encounter Date: 04/30/2023  Check In:  Session Check In - 04/30/23 1219       Check-In   Supervising physician immediately available to respond to emergencies [facility] MD immediately available    Physician(s) Edd Fabian    [facility]    Staff Present Essie Hart, RN, BSN;Randi Idelle Crouch BS, ACSM-CEP, Exercise Physiologist;Kaylee Earlene Plater, MS, ACSM-CEP, Exercise Physiologist;Venna Berberich Chester Holstein, MS, Exercise Physiologist    Virtual Visit No    Medication changes reported     No    Fall or balance concerns reported    No    Tobacco Cessation No Change    Warm-up and Cool-down Performed as group-led instruction    Resistance Training Performed Yes    VAD Patient? No    PAD/SET Patient? No      Pain Assessment   Currently in Pain? No/denies    Multiple Pain Sites No             Capillary Blood Glucose: No results found for this or any previous visit (from the past 24 hour(s)).    Social History   Tobacco Use  Smoking Status Former   Packs/day: 1.00   Years: 22.00   Additional pack years: 0.00   Total pack years: 22.00   Types: Cigarettes   Start date: 1963   Quit date: 1985   Years since quitting: 39.4  Smokeless Tobacco Never    Goals Met:  Proper associated with RPD/PD & O2 Sat Independence with exercise equipment Exercise tolerated well No report of concerns or symptoms today Strength training completed today  Goals Unmet:  Not Applicable  Comments: Service time is from 1010 to 1145.    Dr. Mechele Collin is Medical Director for Pulmonary Rehab at [facility].

## 2023-05-05 ENCOUNTER — Encounter (HOSPITAL_COMMUNITY): Payer: Medicare Other

## 2023-05-07 ENCOUNTER — Encounter (HOSPITAL_COMMUNITY)
Admission: RE | Admit: 2023-05-07 | Discharge: 2023-05-07 | Disposition: A | Discharge status: Home / Self Care | Payer: Medicare Other | Source: Ambulatory Visit | Attending: Pulmonary Disease | Admitting: Pulmonary Disease

## 2023-05-07 VITALS — Wt 170.2 lb

## 2023-05-07 DIAGNOSIS — J449 Chronic obstructive pulmonary disease, unspecified: Secondary | ICD-10-CM | POA: Insufficient documentation

## 2023-05-07 NOTE — Progress Notes (Signed)
Received message from pt that she arrived home safely and she took her Cardizem and is relaxing in the recliner. Will follow up on tomorrow. Alanson Aly, BSN Cardiac and Emergency planning/management officer

## 2023-05-07 NOTE — Progress Notes (Addendum)
Called to the gym floor by Durel Salts RT who is concerned regarding pt heart rate on the second station - treadmill.. Pt was removed from the treadmill and is sitting in a chair.  Her 122 with pulse ox 95%.Elevated her legs.   Heart rate appears irregular/regular. Pt with past medical history of afib/flutter and has undergone several ablations and DCCV.   Placed pt on telemetry monitor which showed afib. Pt bp 118/66 and she complains of feeling "bad" and some shortness of breath difficulty to tell with COPD.  Pt escorted to the treatment room. Pt who was initially was not aware she was in afib is very aware and states that every time my heart does this I feel real "bad".  Unable to describe any particular feeling just bad. Last episode was in February.  Reviewed medications, pt compliant with her med and has not missed any doses.  Called Dr. Chales Abrahams, [facility].  Spoke to [employer].  Updated on todays events as well as the ER visit on yesterday. Chyrl Civatte conveyed that Dr. Chales Abrahams and Young Berry PA are not in the office today.  While on speaker phone with Nashai and myself felt as this was not a new occurrence  and medication wise she is on what she is able to tolerate.  Multaqis listed on her medication list however pt was not able to tolerate after taking it for 2 days. Cardizem CD was increased back to the original dose of 240 mg from the 120 mg dose she was doing while on Multaq.  Sahian generally takes her Cardizem in the evening around 8pm.  Advised to go ahead and take it when she gets home. Has a pulse oximeter she can use to track her heart rate, she is also able to palpate her pulse to tell when she is out of rhythm.  Kasidi feels able to drive herself  home. Chyrl Civatte will talk with Dr. Olen Cordial on tomorrow and contact the patient for next steps which could possibly be a pacemaker with AV nodule ablation.  I ambulated pt around the gym floor, initially felt dizzy  upon standing but this cleared  quickly.  Heart rate ranged form 80-90's.  Advised pt to contact me upon arriving home and at anytime during the drive she should feel unsafe, to please pull over to the side of road and alert 911. Pt to hold on returning to [place] until next steps can be determined and hold on starting the Prednisone burst pack until she hears back from Dr. Chales Abrahams office.  Exit BP 136/66, HR 78 and O2 sat 95%. Pt verbalized understanding and was escorted via wheelchair to her car. Alanson Aly, BSN Cardiac and Emergency planning/management officer

## 2023-05-07 NOTE — Progress Notes (Signed)
Christine Moore who is in today for pulmonary rehab, alerted staff that she had an ER Visit on 6/5 at [facility] for chest tightness, low back pain, fatigue and diaphoresis.. I reviewed notes in [facility]. Felt to be COPD exacerbation and no cardiac ischemia noted. Nebulizer treatment helped and she was given a prescription for prednisone burst.  She has not picked this up from the pharmacy and plans to right after exercise.Talked with her regarding COPD exacerbation signs and symptoms. Pt sees cardiologist -Dr. Chales Abrahams on 6/14, he manages her afib/flutter.  Advised pt to alert Dr. [name] by Earleen Reaper messaging regarding her trip to the ER.  Verbalized understanding. Alanson Aly, BSN Cardiac and Emergency planning/management officer

## 2023-05-08 ENCOUNTER — Other Ambulatory Visit: Payer: Self-pay | Admitting: Pulmonary Disease

## 2023-05-08 ENCOUNTER — Telehealth (HOSPITAL_COMMUNITY): Payer: Self-pay | Admitting: *Deleted

## 2023-05-08 NOTE — Telephone Encounter (Signed)
Called Shylie inquiring of her well being today and whether Dr. Chales Abrahams office contact her for next steps.  Message left for pt to return my call. Alanson Aly, BSN Cardiac and Emergency planning/management officer

## 2023-05-12 ENCOUNTER — Encounter (HOSPITAL_COMMUNITY): Payer: Medicare Other

## 2023-05-13 ENCOUNTER — Other Ambulatory Visit: Payer: Self-pay | Admitting: Cardiology

## 2023-05-13 ENCOUNTER — Telehealth (HOSPITAL_COMMUNITY): Payer: Self-pay

## 2023-05-13 DIAGNOSIS — I4819 Other persistent atrial fibrillation: Secondary | ICD-10-CM

## 2023-05-13 NOTE — Telephone Encounter (Signed)
Called Christine Moore to check on her and inquire about Dr. Chales Abrahams. She stated that she is waiting for his approval to come back to pulmonary rehab. I mentioned I would extend her since we are waiting for Dr. Marcille Blanco approval. [name] hopes to return 6/18.

## 2023-05-13 NOTE — Telephone Encounter (Addendum)
Eliquis 5mg  refill request received. Patient is 77 years old, weight-77.2kg, Crea-0.69 on 10/31/22, Diagnosis-Afib, and last seen by Dr. Lalla Brothers on 12/26/22. Dose is appropriate based on dosing criteria. Will send in refill to requested pharmacy.    Spoke with pt and she is planning to continue with [facility] Dr. Chales Abrahams. Advised will send in a 30 day supply and update the pharmacy and let them know.   Spoke with Baxter Hire at [employer] and updated her that the pt is now being managed by Dr. Dyane Dustman with [facility] and only a 30 day supply is being sent.

## 2023-05-13 NOTE — Progress Notes (Signed)
Pulmonary Individual Treatment Plan  Patient Details  Name: Christine Moore MRN: [id number] Date of Birth: Jun 30, 1946 Referring Provider:   [name] Pulmonary Rehab Walk Test from 02/16/2023 in [facility]  Referring Provider [name]       Initial Encounter Date:  Flowsheet Row Pulmonary Rehab Walk Test from 02/16/2023 in [facility]  Date 02/16/23       Visit Diagnosis: Stage 2 moderate COPD by GOLD classification (HCC)  Patient's Home Medications on Admission:   Current Outpatient Medications:    acyclovir (ZOVIRAX) 400 MG tablet, Take 400 mg by mouth 3 (three) times daily as needed (fever blisters)., Disp: , Rfl:    atorvastatin (LIPITOR) 10 MG tablet, Take 10 mg by mouth at bedtime., Disp: , Rfl:    chlorpheniramine (CHLOR-TRIMETON) 4 MG tablet, Take 4 mg by mouth See admin instructions. Take 4 mg twice daily, may increase to 4 times daily as needed for allergies, Disp: , Rfl:    Cholecalciferol (VITAMIN D) 50 MCG (2000 UT) tablet, Take 2,000 Units by mouth 2 (two) times daily., Disp: , Rfl:    clobetasol ointment (TEMOVATE) 0.05 %, Apply 1 application  topically daily as needed (eczema)., Disp: , Rfl:    diltiazem (CARDIZEM CD) 240 MG 24 hr capsule, Take 1 capsule (240 mg total) by mouth daily. (Patient taking differently: Take 240 mg by mouth daily. Pt stated they increased her dose to 240mg  when cards D/Cd her Multaq), Disp: 90 capsule, Rfl: 3   ELIQUIS 5 MG TABS tablet, Take 1 tablet (5 mg total) by mouth 2 (two) times daily., Disp: 60 tablet, Rfl: 5   Fluticasone-Umeclidin-Vilant (TRELEGY ELLIPTA) 200-62.5-25 MCG/INH AEPB, Inhale 1 puff into the lungs daily., Disp: , Rfl:    hydrocortisone 2.5 % cream, Apply 1 Application topically daily as needed (facial eczema)., Disp: , Rfl:    Ketotifen Fumarate (ALAWAY OP), Place 1 drop into both eyes daily as needed  (allergies)., Disp: , Rfl:    levalbuterol (XOPENEX HFA) 45 MCG/ACT inhaler, Inhale 2 puffs into the lungs 4 (four) times daily as needed for shortness of breath or wheezing., Disp: 1 each, Rfl: 5   levalbuterol (XOPENEX) 0.63 MG/3ML nebulizer solution, Take 3 mLs (0.63 mg total) by nebulization every 6 (six) hours as needed for wheezing or shortness of breath., Disp: 360 mL, Rfl: 12   levothyroxine (SYNTHROID) 50 MCG tablet, Take 50 mcg by mouth daily., Disp: , Rfl:    losartan (COZAAR) 50 MG tablet, Take 1 tablet (50 mg total) by mouth 2 (two) times daily., Disp: 180 tablet, Rfl: 2   Lutein-Zeaxanthin 25-5 MG CAPS, Take 1 capsule by mouth at bedtime., Disp: , Rfl:    montelukast (SINGULAIR) 10 MG tablet, Take 10 mg by mouth at bedtime., Disp: , Rfl:    naproxen sodium (ALEVE) 220 MG tablet, Take 220 mg by mouth daily as needed (headaches)., Disp: , Rfl:    omeprazole (PRILOSEC OTC) 20 MG tablet, Take 20 mg by mouth daily before breakfast., Disp: , Rfl:    OVER THE COUNTER MEDICATION, Take 250 mg by mouth daily. Magnesium 250mg , Disp: , Rfl:    OVER THE COUNTER MEDICATION, Take 500 mcg by mouth daily. Vitamin B12, Disp: , Rfl:    Probiotic Product (PROBIOTIC-10 PO), Take 1 tablet by mouth daily., Disp: , Rfl:    sodium chloride (OCEAN) 0.65 % SOLN nasal spray, Place 1  spray into both nostrils as needed for congestion., Disp: , Rfl:   Past Medical History: Past Medical History:  Diagnosis Date   Atrial fibrillation (HCC)    Atrial flutter (HCC)    COPD (chronic obstructive pulmonary disease) (HCC)    Eczema    Hyperlipidemia    Hypothyroid    Urinary incontinence     Tobacco Use: Social History   Tobacco Use  Smoking Status Former   Packs/day: 1.00   Years: 22.00   Additional pack years: 0.00   Total pack years: 22.00   Types: Cigarettes   Start date: 1963   Quit date: 1985   Years since quitting: 39.4  Smokeless Tobacco Never    Labs: Review Flowsheet        No data to  display          Capillary Blood Glucose: No results found for: "GLUCAP"   Pulmonary Assessment Scores:  Pulmonary Assessment Scores     Row Name 02/16/23 1101         ADL UCSD   SOB Score total 28       CAT Score   CAT Score 24       mMRC Score   mMRC Score 3             UCSD: Self-administered rating of dyspnea associated with activities of daily living (ADLs) 6-point scale (0 = "not at all" to 5 = "maximal or unable to do because of breathlessness")  Scoring Scores range from 0 to 120.  Minimally important difference is 5 units  CAT: CAT can identify the health impairment of COPD patients and is better correlated with disease progression.  CAT has a scoring range of zero to 40. The CAT score is classified into four groups of low (less than 10), medium (10 - 20), high (21-30) and very high (31-40) based on the impact level of disease on health status. A CAT score over 10 suggests significant symptoms.  A worsening CAT score could be explained by an exacerbation, poor medication adherence, poor inhaler technique, or progression of COPD or comorbid conditions.  CAT MCID is 2 points  mMRC: mMRC (Modified Medical Research Council) Dyspnea Scale is used to assess the degree of baseline functional disability in patients of respiratory disease due to dyspnea. No minimal important difference is established. A decrease in score of 1 point or greater is considered a positive change.   Pulmonary Function Assessment:  Pulmonary Function Assessment - 02/16/23 1054       Breath   Bilateral Breath Sounds Wheezes;Decreased;Rhonchi    Shortness of Breath Yes             Exercise Target Goals: Exercise Program Goal: Individual exercise prescription set using results from initial 6 min walk test and THRR while considering  patient's activity barriers and safety.   Exercise Prescription Goal: Initial exercise prescription builds to 30-45 minutes a day of aerobic  activity, 2-3 days per week.  Home exercise guidelines will be given to patient during program as part of exercise prescription that the participant will acknowledge.  Activity Barriers & Risk Stratification:  Activity Barriers & Cardiac Risk Stratification - 02/16/23 1100       Activity Barriers & Cardiac Risk Stratification   Activity Barriers Deconditioning;Muscular Weakness;Shortness of Breath;History of Falls;Back Problems             6 Minute Walk:  6 Minute Walk     Row Name 02/16/23 1146  6 Minute Walk   Phase Initial     Distance 940 feet     Walk Time 6 minutes     # of Rest Breaks 0     MPH 1.78     METS 2.15     RPE 7     Perceived Dyspnea  0     VO2 Peak 7.51     Symptoms Yes (comment)     Comments Productive cough     Resting HR 66 bpm     Resting BP 144/64     Resting Oxygen Saturation  95 %     Exercise Oxygen Saturation  during 6 min walk 94 %     Max Ex. HR 118 bpm     Max Ex. BP 136/62     2 Minute Post BP 120/58       Interval HR   1 Minute HR 66     2 Minute HR 99     3 Minute HR 102     4 Minute HR 119     5 Minute HR 104     6 Minute HR 118     2 Minute Post HR 69     Interval Heart Rate? Yes       Interval Oxygen   Interval Oxygen? Yes     Baseline Oxygen Saturation % 95 %     1 Minute Oxygen Saturation % 94 %     1 Minute Liters of Oxygen 0 L     2 Minute Oxygen Saturation % 94 %     2 Minute Liters of Oxygen 0 L     3 Minute Oxygen Saturation % 94 %     3 Minute Liters of Oxygen 0 L     4 Minute Oxygen Saturation % 95 %     4 Minute Liters of Oxygen 0 L     5 Minute Oxygen Saturation % 95 %     5 Minute Liters of Oxygen 0 L     6 Minute Oxygen Saturation % 95 %     6 Minute Liters of Oxygen 0 L     2 Minute Post Oxygen Saturation % 95 %     2 Minute Post Liters of Oxygen 0 L              Oxygen Initial Assessment:  Oxygen Initial Assessment - 02/16/23 1054       Home Oxygen   Home Oxygen Device None     Sleep Oxygen Prescription None    Home Exercise Oxygen Prescription None    Home Resting Oxygen Prescription None             Oxygen Re-Evaluation:  Oxygen Re-Evaluation     Row Name 03/03/23 4098 04/08/23 1127 05/06/23 0949         Program Oxygen Prescription   Program Oxygen Prescription None None None       Home Oxygen   Home Oxygen Device None None None     Sleep Oxygen Prescription None None None     Home Exercise Oxygen Prescription None None None     Home Resting Oxygen Prescription None None None     Compliance with Home Oxygen Use No No No       Goals/Expected Outcomes   Short Term Goals To learn and exhibit compliance with exercise, home and travel O2 prescription;To learn and understand importance of maintaining oxygen saturations>88%;To learn and demonstrate proper use  of respiratory medications;To learn and demonstrate proper pursed lip breathing techniques or other breathing techniques. ;To learn and understand importance of monitoring SPO2 with pulse oximeter and demonstrate accurate use of the pulse oximeter. To learn and exhibit compliance with exercise, home and travel O2 prescription;To learn and understand importance of maintaining oxygen saturations>88%;To learn and demonstrate proper use of respiratory medications;To learn and demonstrate proper pursed lip breathing techniques or other breathing techniques. ;To learn and understand importance of monitoring SPO2 with pulse oximeter and demonstrate accurate use of the pulse oximeter. To learn and exhibit compliance with exercise, home and travel O2 prescription;To learn and understand importance of maintaining oxygen saturations>88%;To learn and demonstrate proper use of respiratory medications;To learn and demonstrate proper pursed lip breathing techniques or other breathing techniques. ;To learn and understand importance of monitoring SPO2 with pulse oximeter and demonstrate accurate use of the pulse oximeter.      Long  Term Goals Exhibits compliance with exercise, home  and travel O2 prescription;Verbalizes importance of monitoring SPO2 with pulse oximeter and return demonstration;Maintenance of O2 saturations>88%;Exhibits proper breathing techniques, such as pursed lip breathing or other method taught during program session;Compliance with respiratory medication;Demonstrates proper use of MDI's Exhibits compliance with exercise, home  and travel O2 prescription;Verbalizes importance of monitoring SPO2 with pulse oximeter and return demonstration;Maintenance of O2 saturations>88%;Exhibits proper breathing techniques, such as pursed lip breathing or other method taught during program session;Compliance with respiratory medication;Demonstrates proper use of MDI's Exhibits compliance with exercise, home  and travel O2 prescription;Verbalizes importance of monitoring SPO2 with pulse oximeter and return demonstration;Maintenance of O2 saturations>88%;Exhibits proper breathing techniques, such as pursed lip breathing or other method taught during program session;Compliance with respiratory medication;Demonstrates proper use of MDI's     Goals/Expected Outcomes Compliance and understanding of oxygen saturation monitoring and breathing techniques to decrease shortness of breath. Compliance and understanding of oxygen saturation monitoring and breathing techniques to decrease shortness of breath. Compliance and understanding of oxygen saturation monitoring and breathing techniques to decrease shortness of breath.              Oxygen Discharge (Final Oxygen Re-Evaluation):  Oxygen Re-Evaluation - 05/06/23 0949       Program Oxygen Prescription   Program Oxygen Prescription None      Home Oxygen   Home Oxygen Device None    Sleep Oxygen Prescription None    Home Exercise Oxygen Prescription None    Home Resting Oxygen Prescription None    Compliance with Home Oxygen Use No      Goals/Expected Outcomes   Short  Term Goals To learn and exhibit compliance with exercise, home and travel O2 prescription;To learn and understand importance of maintaining oxygen saturations>88%;To learn and demonstrate proper use of respiratory medications;To learn and demonstrate proper pursed lip breathing techniques or other breathing techniques. ;To learn and understand importance of monitoring SPO2 with pulse oximeter and demonstrate accurate use of the pulse oximeter.    Long  Term Goals Exhibits compliance with exercise, home  and travel O2 prescription;Verbalizes importance of monitoring SPO2 with pulse oximeter and return demonstration;Maintenance of O2 saturations>88%;Exhibits proper breathing techniques, such as pursed lip breathing or other method taught during program session;Compliance with respiratory medication;Demonstrates proper use of MDI's    Goals/Expected Outcomes Compliance and understanding of oxygen saturation monitoring and breathing techniques to decrease shortness of breath.             Initial Exercise Prescription:  Initial Exercise Prescription - 02/16/23 1100  Date of Initial Exercise RX and Referring Provider   Date 02/16/23    Referring Provider [name]    Expected Discharge Date 05/14/23      Elliptical   Level 1    Speed 1    Minutes 15      Track   Minutes 15    METs 2.15      Prescription Details   Frequency (times per week) 2    Duration Progress to 30 minutes of continuous aerobic without signs/symptoms of physical distress      Intensity   THRR 40-80% of Max Heartrate 56-113    Ratings of Perceived Exertion 11-13    Perceived Dyspnea 0-4      Progression   Progression Continue progressive overload as per policy without signs/symptoms or physical distress.      Resistance Training   Training Prescription Yes    Weight blue bands    Reps 10-15             Perform Capillary Blood Glucose checks as needed.  Exercise Prescription Changes:   Exercise  Prescription Changes     Row Name 03/03/23 1500 03/17/23 1500 03/31/23 1200 04/14/23 1200 04/21/23 1500     Response to Exercise   Blood Pressure (Admit) 142/68 132/64 116/60 142/60 --   Blood Pressure (Exercise) 152/70 210/100 172/74 164/70 --   Blood Pressure (Exit) 130/62 128/72 124/72 120/60 --   Heart Rate (Admit) 69 bpm 77 bpm 61 bpm 68 bpm --   Heart Rate (Exercise) 85 bpm 98 bpm 86 bpm 83 bpm --   Heart Rate (Exit) 70 bpm 62 bpm 66 bpm 66 bpm --   Oxygen Saturation (Admit) 93 % 98 % 93 % 95 % --   Oxygen Saturation (Exercise) 95 % 94 % 95 % 95 % --   Oxygen Saturation (Exit) 92 % 94 % 91 % 94 % --   Rating of Perceived Exertion (Exercise) 13 13 13 11  --   Perceived Dyspnea (Exercise) 2 2 1 1  --   Duration Continue with 30 min of aerobic exercise without signs/symptoms of physical distress. Continue with 30 min of aerobic exercise without signs/symptoms of physical distress. Continue with 30 min of aerobic exercise without signs/symptoms of physical distress. Continue with 30 min of aerobic exercise without signs/symptoms of physical distress. --   Intensity THRR unchanged THRR unchanged THRR unchanged THRR unchanged --     Progression   Progression -- Continue to progress workloads to maintain intensity without signs/symptoms of physical distress. Continue to progress workloads to maintain intensity without signs/symptoms of physical distress. Continue to progress workloads to maintain intensity without signs/symptoms of physical distress. --     Paramedic Prescription Yes Yes Yes Yes --   Weight blue bands blue bands blue bands blue bands --   Reps 10-15 10-15 10-15 10-15 --   Time 10 Minutes 10 Minutes 10 Minutes 10 Minutes --     Treadmill   MPH 2 2.2 2.2 2.2 --   Grade 0 1.5 1.5 1.5 --   Minutes 15 15 15 15  --   METs 2.53 3.14 3.14 3.14 --     Recumbant Elliptical   Level 2 2 1 2  --   Minutes 15 15 15 15  --   METs 3.8 4.6 2.1 4.2 --     Home  Exercise Plan   Plans to continue exercise at -- -- -- -- [employer] (comment)  [employer]   Frequency -- -- -- --  Add 3 additional days to program exercise sessions.   Initial Home Exercises Provided -- -- -- -- 04/21/23    Row Name 04/28/23 1200 05/07/23 1145           Response to Exercise   Blood Pressure (Admit) 140/60 104/62      Blood Pressure (Exercise) 166/82 --      Blood Pressure (Exit) 136/70 138/62      Heart Rate (Admit) 63 bpm 66 bpm      Heart Rate (Exercise) 77 bpm 115 bpm      Heart Rate (Exit) 69 bpm 90 bpm      Oxygen Saturation (Admit) 95 % 95 %      Oxygen Saturation (Exercise) 94 % 95 %      Oxygen Saturation (Exit) 94 % 95 %      Rating of Perceived Exertion (Exercise) 11 13      Perceived Dyspnea (Exercise) 0 1      Duration Continue with 30 min of aerobic exercise without signs/symptoms of physical distress. Continue with 30 min of aerobic exercise without signs/symptoms of physical distress.      Intensity THRR unchanged THRR unchanged        Progression   Progression Continue to progress workloads to maintain intensity without signs/symptoms of physical distress. Continue to progress workloads to maintain intensity without signs/symptoms of physical distress.        Resistance Training   Training Prescription Yes Yes      Weight blue bands blue bands      Reps 10-15 10-15      Time 10 Minutes 10 Minutes        Treadmill   MPH 2.2 2.4      Grade 1.5 1.5      Minutes 15 15      METs 3.14 3.33        Recumbant Elliptical   Level 2 3      Minutes 15 15      METs 4.8 4.9               Exercise Comments:   Exercise Comments     Row Name 02/24/23 1544 04/21/23 1551         Exercise Comments Pt completed first day of exercise. Christine Moore exercised for 15 min on the recumbent elliptical and 8 min on the track. She averaged 2.8 METs at level 1 on the recumbent elliptical and 1.77 METs on the track. Christine Moore performed the warmup and cooldown  standing without limitations. Discussed METs. Completed home exercise plan. Christine Moore is not currently exercising at home. I encouraged her to exercise 3-5 days/wk for 30 min/day. She seemed hesistant to the frequency, but I reminded her that her graduation date is coming up. The increase in frequency is in consideration of her graduation day. She understood and agreed to try to start exercising at home. I am unsure how motivated Christine Moore is to exercise on her own. She did request a referral to the [facility] PREP program. Will put in a [facility] PREP referral.               Exercise Goals and Review:   Exercise Goals     Row Name 02/16/23 1100 03/03/23 0907 04/08/23 1125 05/06/23 0946       Exercise Goals   Increase Physical Activity Yes Yes Yes Yes    Intervention Provide advice, education, support and counseling about physical activity/exercise needs.;Develop an individualized exercise prescription for aerobic and  resistive training based on initial evaluation findings, risk stratification, comorbidities and participant's personal goals. Provide advice, education, support and counseling about physical activity/exercise needs.;Develop an individualized exercise prescription for aerobic and resistive training based on initial evaluation findings, risk stratification, comorbidities and participant's personal goals. Provide advice, education, support and counseling about physical activity/exercise needs.;Develop an individualized exercise prescription for aerobic and resistive training based on initial evaluation findings, risk stratification, comorbidities and participant's personal goals. Provide advice, education, support and counseling about physical activity/exercise needs.;Develop an individualized exercise prescription for aerobic and resistive training based on initial evaluation findings, risk stratification, comorbidities and participant's personal goals.    Expected Outcomes Short Term: Attend rehab on  a regular basis to increase amount of physical activity.;Long Term: Exercising regularly at least 3-5 days a week.;Long Term: Add in home exercise to make exercise part of routine and to increase amount of physical activity. Short Term: Attend rehab on a regular basis to increase amount of physical activity.;Long Term: Exercising regularly at least 3-5 days a week.;Long Term: Add in home exercise to make exercise part of routine and to increase amount of physical activity. Short Term: Attend rehab on a regular basis to increase amount of physical activity.;Long Term: Exercising regularly at least 3-5 days a week.;Long Term: Add in home exercise to make exercise part of routine and to increase amount of physical activity. Short Term: Attend rehab on a regular basis to increase amount of physical activity.;Long Term: Exercising regularly at least 3-5 days a week.;Long Term: Add in home exercise to make exercise part of routine and to increase amount of physical activity.    Increase Strength and Stamina Yes Yes Yes Yes    Intervention Provide advice, education, support and counseling about physical activity/exercise needs.;Develop an individualized exercise prescription for aerobic and resistive training based on initial evaluation findings, risk stratification, comorbidities and participant's personal goals. Provide advice, education, support and counseling about physical activity/exercise needs.;Develop an individualized exercise prescription for aerobic and resistive training based on initial evaluation findings, risk stratification, comorbidities and participant's personal goals. Provide advice, education, support and counseling about physical activity/exercise needs.;Develop an individualized exercise prescription for aerobic and resistive training based on initial evaluation findings, risk stratification, comorbidities and participant's personal goals. Provide advice, education, support and counseling about  physical activity/exercise needs.;Develop an individualized exercise prescription for aerobic and resistive training based on initial evaluation findings, risk stratification, comorbidities and participant's personal goals.    Expected Outcomes Short Term: Increase workloads from initial exercise prescription for resistance, speed, and METs.;Short Term: Perform resistance training exercises routinely during rehab and add in resistance training at home;Long Term: Improve cardiorespiratory fitness, muscular endurance and strength as measured by increased METs and functional capacity ( ) Short Term: Increase workloads from initial exercise prescription for resistance, speed, and METs.;Short Term: Perform resistance training exercises routinely during rehab and add in resistance training at home;Long Term: Improve cardiorespiratory fitness, muscular endurance and strength as measured by increased METs and functional capacity ( ) Short Term: Increase workloads from initial exercise prescription for resistance, speed, and METs.;Short Term: Perform resistance training exercises routinely during rehab and add in resistance training at home;Long Term: Improve cardiorespiratory fitness, muscular endurance and strength as measured by increased METs and functional capacity ( ) Short Term: Increase workloads from initial exercise prescription for resistance, speed, and METs.;Short Term: Perform resistance training exercises routinely during rehab and add in resistance training at home;Long Term: Improve cardiorespiratory fitness, muscular endurance and strength as measured by increased METs and functional  capacity ( )    Able to understand and use rate of perceived exertion (RPE) scale Yes Yes Yes Yes    Intervention Provide education and explanation on how to use RPE scale Provide education and explanation on how to use RPE scale Provide education and explanation on how to use RPE scale Provide education and  explanation on how to use RPE scale    Expected Outcomes Short Term: Able to use RPE daily in rehab to express subjective intensity level;Long Term:  Able to use RPE to guide intensity level when exercising independently Short Term: Able to use RPE daily in rehab to express subjective intensity level;Long Term:  Able to use RPE to guide intensity level when exercising independently Short Term: Able to use RPE daily in rehab to express subjective intensity level;Long Term:  Able to use RPE to guide intensity level when exercising independently Short Term: Able to use RPE daily in rehab to express subjective intensity level;Long Term:  Able to use RPE to guide intensity level when exercising independently    Able to understand and use Dyspnea scale Yes Yes Yes Yes    Intervention Provide education and explanation on how to use Dyspnea scale Provide education and explanation on how to use Dyspnea scale Provide education and explanation on how to use Dyspnea scale Provide education and explanation on how to use Dyspnea scale    Expected Outcomes Short Term: Able to use Dyspnea scale daily in rehab to express subjective sense of shortness of breath during exertion;Long Term: Able to use Dyspnea scale to guide intensity level when exercising independently Short Term: Able to use Dyspnea scale daily in rehab to express subjective sense of shortness of breath during exertion;Long Term: Able to use Dyspnea scale to guide intensity level when exercising independently Short Term: Able to use Dyspnea scale daily in rehab to express subjective sense of shortness of breath during exertion;Long Term: Able to use Dyspnea scale to guide intensity level when exercising independently Short Term: Able to use Dyspnea scale daily in rehab to express subjective sense of shortness of breath during exertion;Long Term: Able to use Dyspnea scale to guide intensity level when exercising independently    Knowledge and understanding of  Target Heart Rate Range (THRR) Yes Yes Yes Yes    Intervention Provide education and explanation of THRR including how the numbers were predicted and where they are located for reference Provide education and explanation of THRR including how the numbers were predicted and where they are located for reference Provide education and explanation of THRR including how the numbers were predicted and where they are located for reference Provide education and explanation of THRR including how the numbers were predicted and where they are located for reference    Expected Outcomes Short Term: Able to state/look up THRR;Short Term: Able to use daily as guideline for intensity in rehab;Long Term: Able to use THRR to govern intensity when exercising independently Short Term: Able to state/look up THRR;Short Term: Able to use daily as guideline for intensity in rehab;Long Term: Able to use THRR to govern intensity when exercising independently Short Term: Able to state/look up THRR;Short Term: Able to use daily as guideline for intensity in rehab;Long Term: Able to use THRR to govern intensity when exercising independently Short Term: Able to state/look up THRR;Short Term: Able to use daily as guideline for intensity in rehab;Long Term: Able to use THRR to govern intensity when exercising independently    Understanding of Exercise Prescription Yes Yes  Yes Yes    Intervention Provide education, explanation, and written materials on patient's individual exercise prescription Provide education, explanation, and written materials on patient's individual exercise prescription Provide education, explanation, and written materials on patient's individual exercise prescription Provide education, explanation, and written materials on patient's individual exercise prescription    Expected Outcomes Short Term: Able to explain program exercise prescription;Long Term: Able to explain home exercise prescription to exercise independently  Short Term: Able to explain program exercise prescription;Long Term: Able to explain home exercise prescription to exercise independently Short Term: Able to explain program exercise prescription;Long Term: Able to explain home exercise prescription to exercise independently Short Term: Able to explain program exercise prescription;Long Term: Able to explain home exercise prescription to exercise independently             Exercise Goals Re-Evaluation :  Exercise Goals Re-Evaluation     Row Name 03/03/23 2725 04/08/23 1125 05/06/23 0946         Exercise Goal Re-Evaluation   Exercise Goals Review Increase Physical Activity;Able to understand and use Dyspnea scale;Understanding of Exercise Prescription;Increase Strength and Stamina;Knowledge and understanding of Target Heart Rate Range (THRR);Able to understand and use rate of perceived exertion (RPE) scale Increase Physical Activity;Able to understand and use Dyspnea scale;Understanding of Exercise Prescription;Increase Strength and Stamina;Knowledge and understanding of Target Heart Rate Range (THRR);Able to understand and use rate of perceived exertion (RPE) scale Increase Physical Activity;Able to understand and use Dyspnea scale;Understanding of Exercise Prescription;Increase Strength and Stamina;Knowledge and understanding of Target Heart Rate Range (THRR);Able to understand and use rate of perceived exertion (RPE) scale     Comments Tishana has completed 3 exercise sessions. She exercises for 15 min on the recumbent elliptical and treadmill. Christine Moore averages 3.8 METs at level 1 on the recumbent elliptical and 2.53 METs on the treadmill. She performs the warmup and cooldown standing without limitations. Christine Moore has recently increased her workload for both exercise modes as she tolerates progressions well. She seems to enjoy rehab as she converses with other patients and has a positive attitude. Will continue to monitor and progress as able.  Christine Moore has completed 13 exercise sessions. She exercises for 15 min on the recumbent elliptical and treadmill. Christine Moore averages 3.8 METs at level 1 on the recumbent elliptical and 2.53 METs on the treadmill. She performs the warmup and cooldown standing without limitations. Christine Moore has been regressed due to her exercise blood pressure as this has made it difficult to increase workload. Blood pressure has remained in a normal range but she has not returned to her originial workload. Will continue to monitor and progress as able. Christine Moore has completed 20 exercise sessions. She exercises for 15 min on the recumbent elliptical and treadmill. Christine Moore averages 4.6 METs at level 2 on the recumbent elliptical and 3.33 METs on the treadmill. She performs the warmup and cooldown standing without limitations. Christine Moore has increased her level on the recumbent elliptical since her exercise BP has been stable. Her workload on the treadmill has also increased. She tolerated both progressions well. She seems motivated to exercise. We have discussed home exercise as she plans to start walking at home. Will continue to monitor and progress as able.     Expected Outcomes Through exercise at rehab and at home, the patient will decrease shortness of breath with daily activities and feel confident in carrying out an exercise regime at home. Through exercise at rehab and at home, the patient will decrease shortness of breath with daily activities and  feel confident in carrying out an exercise regime at home. Through exercise at rehab and at home, the patient will decrease shortness of breath with daily activities and feel confident in carrying out an exercise regime at home.              Discharge Exercise Prescription (Final Exercise Prescription Changes):  Exercise Prescription Changes - 05/07/23 1145       Response to Exercise   Blood Pressure (Admit) 104/62    Blood Pressure (Exit) 138/62    Heart Rate (Admit) 66 bpm     Heart Rate (Exercise) 115 bpm    Heart Rate (Exit) 90 bpm    Oxygen Saturation (Admit) 95 %    Oxygen Saturation (Exercise) 95 %    Oxygen Saturation (Exit) 95 %    Rating of Perceived Exertion (Exercise) 13    Perceived Dyspnea (Exercise) 1    Duration Continue with 30 min of aerobic exercise without signs/symptoms of physical distress.    Intensity THRR unchanged      Progression   Progression Continue to progress workloads to maintain intensity without signs/symptoms of physical distress.      Resistance Training   Training Prescription Yes    Weight blue bands    Reps 10-15    Time 10 Minutes      Treadmill   MPH 2.4    Grade 1.5    Minutes 15    METs 3.33      Recumbant Elliptical   Level 3    Minutes 15    METs 4.9             Nutrition:  Target Goals: Understanding of nutrition guidelines, daily intake of sodium 1500mg , cholesterol 200mg , calories 30% from fat and 7% or less from saturated fats, daily to have 5 or more servings of fruits and vegetables.  Biometrics:    Nutrition Therapy Plan and Nutrition Goals:  Nutrition Therapy & Goals - 04/23/23 1546       Nutrition Therapy   Diet Heart Healthy Diet    Drug/Food Interactions Statins/Certain Fruits      Personal Nutrition Goals   Nutrition Goal Patient to improve diet quality by using the plate method as a guide for meal planning to include lean protein/plant protein, fruits, vegetables, whole grains, nonfat dairy as part of a well-balanced diet.    Comments Goals in progress. Christine Moore reports eating three meals per day and eating a wide variety of foods including lean protein, fruits and vegetables. She seldom eats out or fries foods. She reports overeating salt and sweets; however, she is not receptive to cutting back on these things at this time. Encouraged patient to priortize high potassium/high fiber foods including fruits and vegetables; she remains receptive to this. She has maintained her  weight since starting with our program. Christine Moore will continue to benefit from participation in pulmonary rehab for nutrition, exercise, and lifestyle modification.      Intervention Plan   Intervention Prescribe, educate and counsel regarding individualized specific dietary modifications aiming towards targeted core components such as weight, hypertension, lipid management, diabetes, heart failure and other comorbidities.;Nutrition handout(s) given to patient.    Expected Outcomes Short Term Goal: Understand basic principles of dietary content, such as calories, fat, sodium, cholesterol and nutrients.;Long Term Goal: Adherence to prescribed nutrition plan.             Nutrition Assessments:  Nutrition Assessments - 02/24/23 1412       Rate Your Plate  Scores   Pre Score 60            MEDIFICTS Score Key: ?70 Need to make dietary changes  40-70 Heart Healthy Diet ? 40 Therapeutic Level Cholesterol Diet  Flowsheet Row PULMONARY REHAB CHRONIC OBSTRUCTIVE PULMONARY DISEASE from 02/24/2023 in [facility]  Picture Your Plate Total Score on Admission 60      Picture Your Plate Scores: <16 Unhealthy dietary pattern with much room for improvement. 41-50 Dietary pattern unlikely to meet recommendations for good health and room for improvement. 51-60 More healthful dietary pattern, with some room for improvement.  >60 Healthy dietary pattern, although there may be some specific behaviors that could be improved.    Nutrition Goals Re-Evaluation:  Nutrition Goals Re-Evaluation     Row Name 02/24/23 1401 03/24/23 1149 04/23/23 1546         Goals   Current Weight 172 lb 13.5 oz (78.4 kg) 173 lb 11.6 oz (78.8 kg) 173 lb 4.5 oz (78.6 kg)     Comment -- no new labs; most recent labs lipids WNL no new labs; most recent labs lipids WNL     Expected Outcome Christine Moore reports eating three meals per day and eating a wide variety of  foods including lean protein, fruits and vegetables. She seldom eats out or fries foods. She reports overeating salt and sweets; however, she is not receptive to cutting back on these things at this time. Encouraged patient to priortize high potassium/high fiber foods including fruits and vegetables. Christine Moore will continue to benefit from participation in pulmonary rehab for nutrition, exercise, and lifestyle modification. Goals in progress. Christine Moore reports eating three meals per day and eating a wide variety of foods including lean protein, fruits and vegetables. She seldom eats out or fries foods. She reports overeating salt and sweets; however, she is not receptive to cutting back on these things at this time. Encouraged patient to priortize high potassium/high fiber foods including fruits and vegetables; she remains receptive to this. She has maintained her weight since starting with our program. Christine Moore will continue to benefit from participation in pulmonary rehab for nutrition, exercise, and lifestyle modification. Goals in progress. Christine Moore reports eating three meals per day and eating a wide variety of foods including lean protein, fruits and vegetables. She seldom eats out or fries foods. She reports overeating salt and sweets; however, she is not receptive to cutting back on these things at this time. Encouraged patient to priortize high potassium/high fiber foods including fruits and vegetables; she remains receptive to this. She has maintained her weight since starting with our program. Christine Moore will continue to benefit from participation in pulmonary rehab for nutrition, exercise, and lifestyle modification.              Nutrition Goals Discharge (Final Nutrition Goals Re-Evaluation):  Nutrition Goals Re-Evaluation - 04/23/23 1546       Goals   Current Weight 173 lb 4.5 oz (78.6 kg)    Comment no new labs; most recent labs lipids WNL    Expected Outcome Goals in progress. Christine Moore reports  eating three meals per day and eating a wide variety of foods including lean protein, fruits and vegetables. She seldom eats out or fries foods. She reports overeating salt and sweets; however, she is not receptive to cutting back on these things at this time. Encouraged patient to priortize high potassium/high fiber foods including fruits and vegetables; she remains receptive to this. She  has maintained her weight since starting with our program. Christine Moore will continue to benefit from participation in pulmonary rehab for nutrition, exercise, and lifestyle modification.             Psychosocial: Target Goals: Acknowledge presence or absence of significant depression and/or stress, maximize coping skills, provide positive support system. Participant is able to verbalize types and ability to use techniques and skills needed for reducing stress and depression.  Initial Review & Psychosocial Screening:  Initial Psych Review & Screening - 02/16/23 1106       Barriers   Psychosocial barriers to participate in program --      Screening Interventions   Interventions --             Quality of Life Scores:  Scores of 19 and below usually indicate a poorer quality of life in these areas.  A difference of  2-3 points is a clinically meaningful difference.  A difference of 2-3 points in the total score of the Quality of Life Index has been associated with significant improvement in overall quality of life, self-image, physical symptoms, and general health in studies assessing change in quality of life.  PHQ-9: Review Flowsheet       02/16/2023 09/23/2021  Depression screen PHQ 2/9  Decreased Interest 0 0  Down, Depressed, Hopeless 1 0  PHQ - 2 Score 1 0  Altered sleeping 1 1  Tired, decreased energy 1 3  Change in appetite 2 3  Feeling bad or failure about yourself  0 0  Trouble concentrating 1 1  Moving slowly or fidgety/restless 0 0  Suicidal thoughts 0 0  PHQ-9 Score 6 8   Difficult doing work/chores Somewhat difficult -   Interpretation of Total Score  Total Score Depression Severity:  1-4 = Minimal depression, 5-9 = Mild depression, 10-14 = Moderate depression, 15-19 = Moderately severe depression, 20-27 = Severe depression   Psychosocial Evaluation and Intervention:  Psychosocial Evaluation - 02/16/23 1056       Psychosocial Evaluation & Interventions   Interventions Relaxation education;Encouraged to exercise with the program and follow exercise prescription    Comments Pt is depressed and stressed about her sister's health. She stated she has a hard time sleeping because she is up worrying or thinking about things. She doesn't like to drive far distances, also concerned when she goes in/out of [employer] and/or [employer]    Expected Outcomes For Christine Moore to participated in [place] without any psychosocial barriers or concerns    Continue Psychosocial Services  Follow up required by staff   Will continue and monitor Christine Moore for any psychosocial needs that may arise            Psychosocial Re-Evaluation:  Psychosocial Re-Evaluation     Row Name 03/04/23 0939 04/06/23 1113 05/11/23 0940         Psychosocial Re-Evaluation   Current issues with Current Depression;Current Sleep Concerns;Current Stress Concerns Current Depression;Current Sleep Concerns;Current Stress Concerns Current Depression;Current Stress Concerns     Comments No change in psychosocial assessment since 02/24/23. Cindia still acknowledges the above issues with no change noted. Christine Moore is still stressed and depressed about her sister's diagnosis. She is still having a hard time falling asleep at night. Christine Moore still declines a referral to a therapist. Shahina has been encouraged to speak to her PCP about her mental health. Resources have been provided to [place] for stress management and better sleep. Julliette still struggles with stress about her sisters health. She is also  worried about her own  health. She still declines a referral to a therapist. Resources have been provided to [facility] for stress management and better sleep.     Expected Outcomes For Kimmey to see a therapist about her sister's terminal diagnosis. She currently declines a referral at this time. Meika also declines to speak to her PCP about possible medications. For Cloie to attend [facility] and to use positive coping skills to mange her stress and depression. We would also like for Keitha to get better sleep through a bedtime routine. For Poppi to attend Pulmonary Rehab and to use positive coping skills to mange her stress and depression.     Interventions Stress management education;Encouraged to attend Pulmonary Rehabilitation for the exercise Stress management education;Encouraged to attend Pulmonary Rehabilitation for the exercise Stress management education;Encouraged to attend Pulmonary Rehabilitation for the exercise     Continue Psychosocial Services  Follow up required by staff  We will continue to monitor and assess Jeriann's mental health while in the program Follow up required by staff  We will continue to monitor and assess Krishika's mental health while in the program No Follow up required              Psychosocial Discharge (Final Psychosocial Re-Evaluation):  Psychosocial Re-Evaluation - 05/11/23 0940       Psychosocial Re-Evaluation   Current issues with Current Depression;Current Stress Concerns    Comments Christine Moore still struggles with stress about her sisters health. She is also worried about her own health. She still declines a referral to a therapist. Resources have been provided to [facility] for stress management and better sleep.    Expected Outcomes For Christine Moore to attend Pulmonary Rehab and to use positive coping skills to mange her stress and depression.    Interventions Stress management education;Encouraged to attend Pulmonary Rehabilitation for the exercise    Continue Psychosocial  Services  No Follow up required             Education: Education Goals: Education classes will be provided on a weekly basis, covering required topics. Participant will state understanding/return demonstration of topics presented.  Learning Barriers/Preferences:  Learning Barriers/Preferences - 02/16/23 1056       Learning Barriers/Preferences   Learning Barriers Hearing;Sight    Learning Preferences Written Material;Group Instruction;Individual Instruction             Education Topics: Introduction to Pulmonary Rehab Group instruction provided by PowerPoint, verbal discussion, and written material to support subject matter. Instructor reviews what Pulmonary Rehab is, the purpose of the program, and how patients are referred.     Know Your Numbers Group instruction that is supported by a PowerPoint presentation. Instructor discusses importance of knowing and understanding resting, exercise, and post-exercise oxygen saturation, heart rate, and blood pressure. Oxygen saturation, heart rate, blood pressure, rating of perceived exertion, and dyspnea are reviewed along with a normal range for these values.  Flowsheet Row PULMONARY REHAB CHRONIC OBSTRUCTIVE PULMONARY DISEASE from 03/12/2023 in [facility], V[facility], [facility]  Date 03/12/23  Educator EP  Instruction Review Code 1- Verbalizes Understanding       Exercise for the Pulmonary Patient Group instruction that is supported by a PowerPoint presentation. Instructor discusses benefits of exercise, core components of exercise, frequency, duration, and intensity of an exercise routine, importance of utilizing pulse oximetry during exercise, safety while exercising, and options of places to exercise outside of rehab.  Flowsheet Row PULMONARY REHAB CHRONIC OBSTRUCTIVE PULMONARY  DISEASE from 03/05/2023 in [facility] for Heart, Vascular, & Lung Health  Date 03/05/23   Educator EP  Instruction Review Code 1- Verbalizes Understanding          MET Level  Group instruction provided by PowerPoint, verbal discussion, and written material to support subject matter. Instructor reviews what METs are and how to increase METs.  Flowsheet Row PULMONARY REHAB CHRONIC OBSTRUCTIVE PULMONARY DISEASE from 05/07/2023 in [facility] for Heart, Vascular, & Lung Health  Date 05/07/23  Educator EP  Instruction Review Code 1- Verbalizes Understanding       Pulmonary Medications Verbally interactive group education provided by instructor with focus on inhaled medications and proper administration. Flowsheet Row PULMONARY REHAB CHRONIC OBSTRUCTIVE PULMONARY DISEASE from 02/26/2023 in [facility] for Heart, Vascular, & Lung Health  Date 02/26/23  Educator RT  Instruction Review Code 1- Verbalizes Understanding       Anatomy and Physiology of the Respiratory System Group instruction provided by PowerPoint, verbal discussion, and written material to support subject matter. Instructor reviews respiratory cycle and anatomical components of the respiratory system and their functions. Instructor also reviews differences in obstructive and restrictive respiratory diseases with examples of each.    Oxygen Safety Group instruction provided by PowerPoint, verbal discussion, and written material to support subject matter. There is an overview of "What is Oxygen" and "Why do we need it".  Instructor also reviews how to create a safe environment for oxygen use, the importance of using oxygen as prescribed, and the risks of noncompliance. There is a brief discussion on traveling with oxygen and resources the patient may utilize. Flowsheet Row PULMONARY REHAB CHRONIC OBSTRUCTIVE PULMONARY DISEASE from 03/19/2023 in [facility] for Heart, Vascular, & Lung Health  Date 03/19/23  Educator RN  Instruction Review Code  1- Verbalizes Understanding       Oxygen Use Group instruction provided by PowerPoint, verbal discussion, and written material to discuss how supplemental oxygen is prescribed and different types of oxygen supply systems. Resources for more information are provided.  Flowsheet Row PULMONARY REHAB CHRONIC OBSTRUCTIVE PULMONARY DISEASE from 03/26/2023 in [facility] for Heart, Vascular, & Lung Health  Date 03/26/23  Educator RT  Instruction Review Code 1- Verbalizes Understanding       Breathing Techniques Group instruction that is supported by demonstration and informational handouts. Instructor discusses the benefits of pursed lip and diaphragmatic breathing and detailed demonstration on how to perform both.  Flowsheet Row PULMONARY REHAB CHRONIC OBSTRUCTIVE PULMONARY DISEASE from 04/09/2023 in [facility] for Heart, Vascular, & Lung Health  Date 04/09/23  Educator RT  Instruction Review Code 1- Verbalizes Understanding        Risk Factor Reduction Group instruction that is supported by a PowerPoint presentation. Instructor discusses the definition of a risk factor, different risk factors for pulmonary disease, and how the heart and lungs work together.   MD Day A group question and answer session with a medical doctor that allows participants to ask questions that relate to their pulmonary disease state.   Nutrition for the Pulmonary Patient Group instruction provided by PowerPoint slides, verbal discussion, and written materials to support subject matter. The instructor gives an explanation and review of healthy diet recommendations, which includes a discussion on weight management, recommendations for fruit and vegetable consumption, as well as protein, fluid, caffeine, fiber, sodium, sugar, and alcohol. Tips for eating when patients are  short of breath are discussed.    Other Education Group or individual verbal, written, or video  instructions that support the educational goals of the pulmonary rehab program. Flowsheet Row PULMONARY REHAB CHRONIC OBSTRUCTIVE PULMONARY DISEASE from 04/30/2023 in [facility]  Date 04/30/23  Educator RN  Instruction Review Code 1- Verbalizes Understanding        Knowledge Questionnaire Score:  Knowledge Questionnaire Score - 02/16/23 1132       Knowledge Questionnaire Score   Pre Score 17/18             Core Components/Risk Factors/Patient Goals at Admission:  Personal Goals and Risk Factors at Admission - 02/16/23 1057       Core Components/Risk Factors/Patient Goals on Admission    Weight Management Weight Loss    Improve shortness of breath with ADL's Yes    Intervention Provide education, individualized exercise plan and daily activity instruction to help decrease symptoms of SOB with activities of daily living.    Expected Outcomes Short Term: Improve cardiorespiratory fitness to achieve a reduction of symptoms when performing ADLs;Long Term: Be able to perform more ADLs without symptoms or delay the onset of symptoms    Increase knowledge of respiratory medications and ability to use respiratory devices properly  Yes    Intervention Provide education and demonstration as needed of appropriate use of medications, inhalers, and oxygen therapy.    Expected Outcomes Short Term: Achieves understanding of medications use. Understands that oxygen is a medication prescribed by physician. Demonstrates appropriate use of inhaler and oxygen therapy.;Long Term: Maintain appropriate use of medications, inhalers, and oxygen therapy.    Stress Yes    Intervention Offer individual and/or small group education and counseling on adjustment to heart disease, stress management and health-related lifestyle change. Teach and support self-help strategies.;Refer participants experiencing significant psychosocial distress to appropriate  mental health specialists for further evaluation and treatment. When possible, include family members and significant others in education/counseling sessions.    Expected Outcomes Short Term: Participant demonstrates changes in health-related behavior, relaxation and other stress management skills, ability to obtain effective social support, and compliance with psychotropic medications if prescribed.;Long Term: Emotional wellbeing is indicated by absence of clinically significant psychosocial distress or social isolation.             Core Components/Risk Factors/Patient Goals Review:   Goals and Risk Factor Review     Row Name 03/04/23 0944 04/06/23 1245 05/11/23 0945         Core Components/Risk Factors/Patient Goals Review   Personal Goals Review Other;Improve shortness of breath with ADL's;Develop more efficient breathing techniques such as purse lipped breathing and diaphragmatic breathing and practicing self-pacing with activity.;Increase knowledge of respiratory medications and ability to use respiratory devices properly.;Stress  Weight loss Improve shortness of breath with ADL's;Develop more efficient breathing techniques such as purse lipped breathing and diaphragmatic breathing and practicing self-pacing with activity.;Increase knowledge of respiratory medications and ability to use respiratory devices properly.;Stress;Weight Management/Obesity [employer]/Obesity;Stress     Review Dijana has completed 3 PR sessions so far. She is currently exercising on the treadmill and recumbent elliptical. She has increased her workload on the recumbent elliptical and increased her speed on the treadmill. Her oxygen saturations have been stable on room air. She is practicing pursed lip breathing. She knows how to report her Rate of Perceived Exertion and her Dyspnea scale to staff. We are hoping exercise will decrease her stress level.  Ileah has attended the respiratory medication education  class. It is too soon to see any progress in weight loss. She is working with our dietician. We will continue to assess any progress. [place] hasn't made any progress on her goal of losing weight. She has been working with our dietitian. (See dietician notes) She has met her goal of increasing her knowledge of respiratory meds and using them properly. She has correctly stated when to use her inhaler and has properly demonstrated it with our respiratory therapist. She has also met her goal of developing more efficient breathing techniques and self-pacing. Shayden can initiate pursed lip breathing when she is short of breath. Ernestine has been resistant to reaching out for professional mental help for her stress. Resources have been provided. Jenetta hasn't made any progress on her goal of losing weight. She has been working with our dietitian (See dietician notes). Julio is currently dealing with the stress of being sick and having to go to the ER. She has also been going in and out of [facility]. Jodilyn has been resistant to reaching out for professional mental help for her stress. Resources have been provided. Pt. will benefit from participation in PR for nutrition, exercise and lifestyle modification.     Expected Outcomes See admission goals See admission goals See admission goals              Core Components/Risk Factors/Patient Goals at Discharge (Final Review):   Goals and Risk Factor Review - 05/11/23 0945       Core Components/Risk Factors/Patient Goals Review   Personal Goals Review Weight Management/Obesity;Stress    Review Haydn hasn't made any progress on her goal of losing weight. She has been working with our dietitian (See dietician notes). Caileen is currently dealing with the stress of being sick and having to go to the ER. She has also been going in and out of [facility]. Noe has been resistant to reaching out for professional mental help for her stress. Resources have been provided. Pt.  will benefit from participation in PR for nutrition, exercise and lifestyle modification.    Expected Outcomes See admission goals             ITP Comments:Pt is making expected progress toward Pulmonary Rehab goals after completing 21 sessions. Recommend continued exercise, life style modification, education, and utilization of breathing techniques to increase stamina and strength, while also decreasing shortness of breath with exertion.  Dr. Mechele Collin is Medical Director for Pulmonary Rehab at [facility].

## 2023-05-14 ENCOUNTER — Encounter (HOSPITAL_COMMUNITY): Payer: Medicare Other

## 2023-05-19 ENCOUNTER — Encounter (HOSPITAL_COMMUNITY)
Admission: RE | Admit: 2023-05-19 | Discharge: 2023-05-19 | Disposition: A | Discharge status: Home / Self Care | Payer: Medicare Other | Source: Ambulatory Visit | Attending: Pulmonary Disease | Admitting: Pulmonary Disease

## 2023-05-19 DIAGNOSIS — J449 Chronic obstructive pulmonary disease, unspecified: Secondary | ICD-10-CM

## 2023-05-19 NOTE — Progress Notes (Signed)
Daily Session Note  Patient Details  Name: Christine Moore MRN: [id number] Date of Birth: 17-Dec-1945 Referring Provider:   [name] Pulmonary Rehab Walk Test from 02/16/2023 in [facility]  Referring Provider [name]       Encounter Date: 05/19/2023  Check In:  Session Check In - 05/19/23 1031       Check-In   Supervising physician immediately available to respond to emergencies [facility] MD immediately available    Physician(s) Bernadene Person, NP    Location [facility]    Staff Present Elissa Lovett BS, ACSM-CEP, Exercise Physiologist;Kaylee Earlene Plater, MS, ACSM-CEP, Exercise Physiologist;Ruben Mahler Synthia Innocent, RN, BSN    Virtual Visit No    Medication changes reported     No    Fall or balance concerns reported    No    Tobacco Cessation No Change    Warm-up and Cool-down Performed as group-led instruction    Resistance Training Performed Yes    VAD Patient? No    PAD/SET Patient? No      Pain Assessment   Currently in Pain? No/denies    Multiple Pain Sites No             Capillary Blood Glucose: No results found for this or any previous visit (from the past 24 hour(s)).    Social History   Tobacco Use  Smoking Status Former   Packs/day: 1.00   Years: 22.00   Additional pack years: 0.00   Total pack years: 22.00   Types: Cigarettes   Start date: 1963   Quit date: 1985   Years since quitting: 39.4  Smokeless Tobacco Never    Goals Met:  Proper associated with RPD/PD & O2 Sat Independence with exercise equipment Exercise tolerated well No report of concerns or symptoms today Strength training completed today  Goals Unmet:  Not Applicable  Comments: Service time is from 1008 to 1135.    Dr. Mechele Collin is Medical Director for Pulmonary Rehab at [facility].

## 2023-05-19 NOTE — Progress Notes (Signed)
Pt's PHQ-9 score went from a 6 pre pulmonary rehab with somewhat difficulty to a 9 post pulmonary rehab with very difficult to work, take care of things at home, or get along with other people. Discussed what was going on at home that's changed and Yamely stated that her sister's health is stressful. She states that she has "uncurable cancer" and they are extremely close. She is having a hard time dealing with her decline in health. When offered a referral to a mental health professional Beryle stated yes. Will place a referral to Behavioral Health.

## 2023-05-21 ENCOUNTER — Encounter (HOSPITAL_COMMUNITY)
Admission: RE | Admit: 2023-05-21 | Discharge: 2023-05-21 | Disposition: A | Discharge status: Home / Self Care | Payer: Medicare Other | Source: Ambulatory Visit | Attending: Pulmonary Disease | Admitting: Pulmonary Disease

## 2023-05-21 DIAGNOSIS — J449 Chronic obstructive pulmonary disease, unspecified: Secondary | ICD-10-CM

## 2023-05-21 NOTE — Progress Notes (Signed)
Daily Session Note  Patient Details  Name: Christine Moore MRN: [id number] Date of Birth: 1946-10-15 Referring Provider:   [name] Pulmonary Rehab Walk Test from 02/16/2023 in [facility]  Referring Provider [name]       Encounter Date: 05/21/2023  Check In:  Session Check In - 05/21/23 1126       Check-In   Supervising physician immediately available to respond to emergencies [facility] MD immediately available    Physician(s) Edd Fabian, NP    Location [facility]    Staff Present Raford Pitcher, MS, ACSM-CEP, Exercise Physiologist;Roni Scow Synthia Innocent, RN, BSN    Virtual Visit No    Medication changes reported     No    Fall or balance concerns reported    No    Tobacco Cessation No Change    Warm-up and Cool-down Performed as group-led instruction    Resistance Training Performed Yes    VAD Patient? No    PAD/SET Patient? No      Pain Assessment   Currently in Pain? No/denies    Multiple Pain Sites No             Capillary Blood Glucose: No results found for this or any previous visit (from the past 24 hour(s)).    Social History   Tobacco Use  Smoking Status Former   Packs/day: 1.00   Years: 22.00   Additional pack years: 0.00   Total pack years: 22.00   Types: Cigarettes   Start date: 1963   Quit date: 1985   Years since quitting: 39.4  Smokeless Tobacco Never    Goals Met:  Proper associated with RPD/PD & O2 Sat Independence with exercise equipment Exercise tolerated well No report of concerns or symptoms today Strength training completed today  Goals Unmet:  Not Applicable  Comments: Service time is from 1014 to 1150.    Dr. Mechele Collin is Medical Director for Pulmonary Rehab at [facility].

## 2023-05-22 NOTE — Progress Notes (Signed)
Discharge Progress Report  Patient Details  Name: Christine Moore MRN: [id number] Date of Birth: September 10, 1946 Referring Provider:   [name] Pulmonary Rehab Walk Test from 02/16/2023 in [facility]  Referring Provider [name]        Number of Visits: 23  Reason for Discharge:  Patient has met program and personal goals.  Smoking History:  Social History   Tobacco Use  Smoking Status Former   Packs/day: 1.00   Years: 22.00   Additional pack years: 0.00   Total pack years: 22.00   Types: Cigarettes   Start date: 1963   Quit date: 1985   Years since quitting: 39.4  Smokeless Tobacco Never    Diagnosis:  Stage 2 moderate COPD by GOLD classification (HCC)  ADL [facility]:  Pulmonary Assessment Scores     Row Name 02/16/23 1101 05/19/23 1104 05/19/23 1115     ADL [facility]   ADL Phase -- Exit Exit   SOB Score total 28 -- 23     CAT Score   CAT Score 24 -- 24     mMRC Score   mMRC Score 3 1 --            Initial Exercise Prescription:  Initial Exercise Prescription - 02/16/23 1100       Date of Initial Exercise RX and Referring Provider   Date 02/16/23    Referring Provider [name]    Expected Discharge Date 05/14/23      Elliptical   Level 1    Speed 1    Minutes 15      Track   Minutes 15    METs 2.15      Prescription Details   Frequency (times per week) 2    Duration Progress to 30 minutes of continuous aerobic without signs/symptoms of physical distress      Intensity   THRR 40-80% of Max Heartrate 56-113    Ratings of Perceived Exertion 11-13    Perceived Dyspnea 0-4      Progression   Progression Continue progressive overload as per policy without signs/symptoms or physical distress.      Resistance Training   Training Prescription Yes    Weight blue bands    Reps 10-15             Discharge Exercise Prescription (Final Exercise Prescription Changes):  Exercise  Prescription Changes - 05/07/23 1145       Response to Exercise   Blood Pressure (Admit) 104/62    Blood Pressure (Exit) 138/62    Heart Rate (Admit) 66 bpm    Heart Rate (Exercise) 115 bpm    Heart Rate (Exit) 90 bpm    Oxygen Saturation (Admit) 95 %    Oxygen Saturation (Exercise) 95 %    Oxygen Saturation (Exit) 95 %    Rating of Perceived Exertion (Exercise) 13    Perceived Dyspnea (Exercise) 1    Duration Continue with 30 min of aerobic exercise without signs/symptoms of physical distress.    Intensity THRR unchanged      Progression   Progression Continue to progress workloads to maintain intensity without signs/symptoms of physical distress.      Resistance Training   Training Prescription Yes    Weight blue bands    Reps 10-15    Time 10 Minutes      Treadmill   MPH 2.4    Grade 1.5    Minutes 15  METs 3.33      Recumbant Elliptical   Level 3    Minutes 15    METs 4.9             Functional Capacity:  6 Minute Walk     Row Name 02/16/23 1146 05/19/23 1514       6 Minute Walk   Phase Initial Discharge    Distance 940 feet 1618 feet    Distance % Change -- 72.13 %    Distance Feet Change -- 678 ft    Walk Time 6 minutes 6 minutes    # of Rest Breaks 0 0    MPH 1.78 3.06    METS 2.15 3.28    RPE 7 9    Perceived Dyspnea  0 1    VO2 Peak 7.51 11.48    Symptoms Yes (comment) No    Comments Productive cough --    Resting HR 66 bpm 64 bpm    Resting BP 144/64 118/58    Resting Oxygen Saturation  95 % 96 %    Exercise Oxygen Saturation  during 6 min walk 94 % 95 %    Max Ex. HR 118 bpm 95 bpm    Max Ex. BP 136/62 160/60    2 Minute Post BP 120/58 130/66      Interval HR   1 Minute HR 66 62    2 Minute HR 99 82    3 Minute HR 102 83    4 Minute HR 119 95    5 Minute HR 104 82    6 Minute HR 118 85    2 Minute Post HR 69 68    Interval Heart Rate? Yes Yes      Interval Oxygen   Interval Oxygen? Yes Yes    Baseline Oxygen Saturation %  95 % 96 %    1 Minute Oxygen Saturation % 94 % 96 %    1 Minute Liters of Oxygen 0 L 0 L    2 Minute Oxygen Saturation % 94 % 96 %    2 Minute Liters of Oxygen 0 L 0 L    3 Minute Oxygen Saturation % 94 % 96 %    3 Minute Liters of Oxygen 0 L 0 L    4 Minute Oxygen Saturation % 95 % 97 %    4 Minute Liters of Oxygen 0 L 0 L    5 Minute Oxygen Saturation % 95 % 96 %    5 Minute Liters of Oxygen 0 L 0 L    6 Minute Oxygen Saturation % 95 % 95 %    6 Minute Liters of Oxygen 0 L 0 L    2 Minute Post Oxygen Saturation % 95 % 96 %    2 Minute Post Liters of Oxygen 0 L 0 L             Psychological, QOL, Others - Outcomes: PHQ 2/9:    05/19/2023   11:14 AM 02/16/2023   11:02 AM 09/23/2021   11:21 AM  Depression screen PHQ 2/9  Decreased Interest 1 0 0  Down, Depressed, Hopeless 1 1 0  PHQ - 2 Score 2 1 0  Altered sleeping 2 1 1   Tired, decreased energy 2 1 3   Change in appetite 2 2 3   Feeling bad or failure about yourself  0 0 0  Trouble concentrating 1 1 1   Moving slowly or fidgety/restless 0 0 0  Suicidal thoughts 0 0 0  PHQ-9 Score 9 6 8   Difficult doing work/chores Very difficult Somewhat difficult     Quality of Life:   Personal Goals: Goals established at orientation with interventions provided to work toward goal.  Personal Goals and Risk Factors at Admission - 02/16/23 1057       Core Components/Risk Factors/Patient Goals on Admission    Weight Management Weight Loss    Improve shortness of breath with ADL's Yes    Intervention Provide education, individualized exercise plan and daily activity instruction to help decrease symptoms of SOB with activities of daily living.    Expected Outcomes Short Term: Improve cardiorespiratory fitness to achieve a reduction of symptoms when performing ADLs;Long Term: Be able to perform more ADLs without symptoms or delay the onset of symptoms    Increase knowledge of respiratory medications and ability to use respiratory  devices properly  Yes    Intervention Provide education and demonstration as needed of appropriate use of medications, inhalers, and oxygen therapy.    Expected Outcomes Short Term: Achieves understanding of medications use. Understands that oxygen is a medication prescribed by physician. Demonstrates appropriate use of inhaler and oxygen therapy.;Long Term: Maintain appropriate use of medications, inhalers, and oxygen therapy.    Stress Yes    Intervention Offer individual and/or small group education and counseling on adjustment to heart disease, stress management and health-related lifestyle change. Teach and support self-help strategies.;Refer participants experiencing significant psychosocial distress to appropriate mental health specialists for further evaluation and treatment. When possible, include family members and significant others in education/counseling sessions.    Expected Outcomes Short Term: Participant demonstrates changes in health-related behavior, relaxation and other stress management skills, ability to obtain effective social support, and compliance with psychotropic medications if prescribed.;Long Term: Emotional wellbeing is indicated by absence of clinically significant psychosocial distress or social isolation.              Personal Goals Discharge:  Goals and Risk Factor Review     Row Name 03/04/23 0944 04/06/23 1245 05/11/23 0945         Core Components/Risk Factors/Patient Goals Review   Personal Goals Review Other;Improve shortness of breath with ADL's;Develop more efficient breathing techniques such as purse lipped breathing and diaphragmatic breathing and practicing self-pacing with activity.;Increase knowledge of respiratory medications and ability to use respiratory devices properly.;Stress  Weight loss Improve shortness of breath with ADL's;Develop more efficient breathing techniques such as purse lipped breathing and diaphragmatic breathing and practicing  self-pacing with activity.;Increase knowledge of respiratory medications and ability to use respiratory devices properly.;Stress;Weight Management/Obesity [employer]/Obesity;Stress     Review Christine Moore has completed 3 PR sessions so far. She is currently exercising on the treadmill and recumbent elliptical. She has increased her workload on the recumbent elliptical and increased her speed on the treadmill. Her oxygen saturations have been stable on room air. She is practicing pursed lip breathing. She knows how to report her Rate of Perceived Exertion and her Dyspnea scale to staff. We are hoping exercise will decrease her stress level. Christine Moore has attended the respiratory medication education class. It is too soon to see any progress in weight loss. She is working with our dietician. We will continue to assess any progress. Christine Moore hasn't made any progress on her goal of losing weight. She has been working with our dietitian. (See dietician notes) She has met her goal of increasing her knowledge of respiratory meds and using them properly. She has correctly stated  when to use her inhaler and has properly demonstrated it with our respiratory therapist. She has also met her goal of developing more efficient breathing techniques and self-pacing. Christine Moore can initiate pursed lip breathing when she is short of breath. Christine Moore has been resistant to reaching out for professional mental help for her stress. Resources have been provided. Christine Moore hasn't made any progress on her goal of losing weight. She has been working with our dietitian (See dietician notes). Christine Moore is currently dealing with the stress of being sick and having to go to the ER. She has also been going in and out of [facility]. Christine Moore has been resistant to reaching out for professional mental help for her stress. Resources have been provided. Pt. will benefit from participation in PR for nutrition, exercise and lifestyle modification.     Expected  Outcomes See admission goals See admission goals See admission goals              Exercise Goals and Review:  Exercise Goals     Row Name 02/16/23 1100 03/03/23 0907 04/08/23 1125 05/06/23 0946       Exercise Goals   Increase Physical Activity Yes Yes Yes Yes    Intervention Provide advice, education, support and counseling about physical activity/exercise needs.;Develop an individualized exercise prescription for aerobic and resistive training based on initial evaluation findings, risk stratification, comorbidities and participant's personal goals. Provide advice, education, support and counseling about physical activity/exercise needs.;Develop an individualized exercise prescription for aerobic and resistive training based on initial evaluation findings, risk stratification, comorbidities and participant's personal goals. Provide advice, education, support and counseling about physical activity/exercise needs.;Develop an individualized exercise prescription for aerobic and resistive training based on initial evaluation findings, risk stratification, comorbidities and participant's personal goals. Provide advice, education, support and counseling about physical activity/exercise needs.;Develop an individualized exercise prescription for aerobic and resistive training based on initial evaluation findings, risk stratification, comorbidities and participant's personal goals.    Expected Outcomes Short Term: Attend rehab on a regular basis to increase amount of physical activity.;Long Term: Exercising regularly at least 3-5 days a week.;Long Term: Add in home exercise to make exercise part of routine and to increase amount of physical activity. Short Term: Attend rehab on a regular basis to increase amount of physical activity.;Long Term: Exercising regularly at least 3-5 days a week.;Long Term: Add in home exercise to make exercise part of routine and to increase amount of physical activity. Short  Term: Attend rehab on a regular basis to increase amount of physical activity.;Long Term: Exercising regularly at least 3-5 days a week.;Long Term: Add in home exercise to make exercise part of routine and to increase amount of physical activity. Short Term: Attend rehab on a regular basis to increase amount of physical activity.;Long Term: Exercising regularly at least 3-5 days a week.;Long Term: Add in home exercise to make exercise part of routine and to increase amount of physical activity.    Increase Strength and Stamina Yes Yes Yes Yes    Intervention Provide advice, education, support and counseling about physical activity/exercise needs.;Develop an individualized exercise prescription for aerobic and resistive training based on initial evaluation findings, risk stratification, comorbidities and participant's personal goals. Provide advice, education, support and counseling about physical activity/exercise needs.;Develop an individualized exercise prescription for aerobic and resistive training based on initial evaluation findings, risk stratification, comorbidities and participant's personal goals. Provide advice, education, support and counseling about physical activity/exercise needs.;Develop an individualized exercise prescription for aerobic and resistive training based on  initial evaluation findings, risk stratification, comorbidities and participant's personal goals. Provide advice, education, support and counseling about physical activity/exercise needs.;Develop an individualized exercise prescription for aerobic and resistive training based on initial evaluation findings, risk stratification, comorbidities and participant's personal goals.    Expected Outcomes Short Term: Increase workloads from initial exercise prescription for resistance, speed, and METs.;Short Term: Perform resistance training exercises routinely during rehab and add in resistance training at home;Long Term: Improve  cardiorespiratory fitness, muscular endurance and strength as measured by increased METs and functional capacity ( ) Short Term: Increase workloads from initial exercise prescription for resistance, speed, and METs.;Short Term: Perform resistance training exercises routinely during rehab and add in resistance training at home;Long Term: Improve cardiorespiratory fitness, muscular endurance and strength as measured by increased METs and functional capacity ( ) Short Term: Increase workloads from initial exercise prescription for resistance, speed, and METs.;Short Term: Perform resistance training exercises routinely during rehab and add in resistance training at home;Long Term: Improve cardiorespiratory fitness, muscular endurance and strength as measured by increased METs and functional capacity ( ) Short Term: Increase workloads from initial exercise prescription for resistance, speed, and METs.;Short Term: Perform resistance training exercises routinely during rehab and add in resistance training at home;Long Term: Improve cardiorespiratory fitness, muscular endurance and strength as measured by increased METs and functional capacity ( )    Able to understand and use rate of perceived exertion (RPE) scale Yes Yes Yes Yes    Intervention Provide education and explanation on how to use RPE scale Provide education and explanation on how to use RPE scale Provide education and explanation on how to use RPE scale Provide education and explanation on how to use RPE scale    Expected Outcomes Short Term: Able to use RPE daily in rehab to express subjective intensity level;Long Term:  Able to use RPE to guide intensity level when exercising independently Short Term: Able to use RPE daily in rehab to express subjective intensity level;Long Term:  Able to use RPE to guide intensity level when exercising independently Short Term: Able to use RPE daily in rehab to express subjective intensity level;Long Term:   Able to use RPE to guide intensity level when exercising independently Short Term: Able to use RPE daily in rehab to express subjective intensity level;Long Term:  Able to use RPE to guide intensity level when exercising independently    Able to understand and use Dyspnea scale Yes Yes Yes Yes    Intervention Provide education and explanation on how to use Dyspnea scale Provide education and explanation on how to use Dyspnea scale Provide education and explanation on how to use Dyspnea scale Provide education and explanation on how to use Dyspnea scale    Expected Outcomes Short Term: Able to use Dyspnea scale daily in rehab to express subjective sense of shortness of breath during exertion;Long Term: Able to use Dyspnea scale to guide intensity level when exercising independently Short Term: Able to use Dyspnea scale daily in rehab to express subjective sense of shortness of breath during exertion;Long Term: Able to use Dyspnea scale to guide intensity level when exercising independently Short Term: Able to use Dyspnea scale daily in rehab to express subjective sense of shortness of breath during exertion;Long Term: Able to use Dyspnea scale to guide intensity level when exercising independently Short Term: Able to use Dyspnea scale daily in rehab to express subjective sense of shortness of breath during exertion;Long Term: Able to use Dyspnea scale to guide intensity level when exercising independently  Knowledge and understanding of Target Heart Rate Range (THRR) Yes Yes Yes Yes    Intervention Provide education and explanation of THRR including how the numbers were predicted and where they are located for reference Provide education and explanation of THRR including how the numbers were predicted and where they are located for reference Provide education and explanation of THRR including how the numbers were predicted and where they are located for reference Provide education and explanation of THRR  including how the numbers were predicted and where they are located for reference    Expected Outcomes Short Term: Able to state/look up THRR;Short Term: Able to use daily as guideline for intensity in rehab;Long Term: Able to use THRR to govern intensity when exercising independently Short Term: Able to state/look up THRR;Short Term: Able to use daily as guideline for intensity in rehab;Long Term: Able to use THRR to govern intensity when exercising independently Short Term: Able to state/look up THRR;Short Term: Able to use daily as guideline for intensity in rehab;Long Term: Able to use THRR to govern intensity when exercising independently Short Term: Able to state/look up THRR;Short Term: Able to use daily as guideline for intensity in rehab;Long Term: Able to use THRR to govern intensity when exercising independently    Understanding of Exercise Prescription Yes Yes Yes Yes    Intervention Provide education, explanation, and written materials on patient's individual exercise prescription Provide education, explanation, and written materials on patient's individual exercise prescription Provide education, explanation, and written materials on patient's individual exercise prescription Provide education, explanation, and written materials on patient's individual exercise prescription    Expected Outcomes Short Term: Able to explain program exercise prescription;Long Term: Able to explain home exercise prescription to exercise independently Short Term: Able to explain program exercise prescription;Long Term: Able to explain home exercise prescription to exercise independently Short Term: Able to explain program exercise prescription;Long Term: Able to explain home exercise prescription to exercise independently Short Term: Able to explain program exercise prescription;Long Term: Able to explain home exercise prescription to exercise independently             Exercise Goals Re-Evaluation:  Exercise  Goals Re-Evaluation     Row Name 03/03/23 4259 04/08/23 1125 05/06/23 0946         Exercise Goal Re-Evaluation   Exercise Goals Review Increase Physical Activity;Able to understand and use Dyspnea scale;Understanding of Exercise Prescription;Increase Strength and Stamina;Knowledge and understanding of Target Heart Rate Range (THRR);Able to understand and use rate of perceived exertion (RPE) scale Increase Physical Activity;Able to understand and use Dyspnea scale;Understanding of Exercise Prescription;Increase Strength and Stamina;Knowledge and understanding of Target Heart Rate Range (THRR);Able to understand and use rate of perceived exertion (RPE) scale Increase Physical Activity;Able to understand and use Dyspnea scale;Understanding of Exercise Prescription;Increase Strength and Stamina;Knowledge and understanding of Target Heart Rate Range (THRR);Able to understand and use rate of perceived exertion (RPE) scale     Comments Lynnae has completed 3 exercise sessions. She exercises for 15 min on the recumbent elliptical and treadmill. Kemara averages 3.8 METs at level 1 on the recumbent elliptical and 2.53 METs on the treadmill. She performs the warmup and cooldown standing without limitations. Kassey has recently increased her workload for both exercise modes as she tolerates progressions well. She seems to enjoy rehab as she converses with other patients and has a positive attitude. Will continue to monitor and progress as able. Arthi has completed 13 exercise sessions. She exercises for 15 min on the recumbent elliptical  and treadmill. Makailee averages 3.8 METs at level 1 on the recumbent elliptical and 2.53 METs on the treadmill. She performs the warmup and cooldown standing without limitations. Laycee has been regressed due to her exercise blood pressure as this has made it difficult to increase workload. Blood pressure has remained in a normal range but she has not returned to her originial  workload. Will continue to monitor and progress as able. Ambera has completed 20 exercise sessions. She exercises for 15 min on the recumbent elliptical and treadmill. Haillie averages 4.6 METs at level 2 on the recumbent elliptical and 3.33 METs on the treadmill. She performs the warmup and cooldown standing without limitations. Marilena has increased her level on the recumbent elliptical since her exercise BP has been stable. Her workload on the treadmill has also increased. She tolerated both progressions well. She seems motivated to exercise. We have discussed home exercise as she plans to start walking at home. Will continue to monitor and progress as able.     Expected Outcomes Through exercise at rehab and at home, the patient will decrease shortness of breath with daily activities and feel confident in carrying out an exercise regime at home. Through exercise at rehab and at home, the patient will decrease shortness of breath with daily activities and feel confident in carrying out an exercise regime at home. Through exercise at rehab and at home, the patient will decrease shortness of breath with daily activities and feel confident in carrying out an exercise regime at home.              Nutrition & Weight - Outcomes:    Nutrition:  Nutrition Therapy & Goals - 04/23/23 1546       Nutrition Therapy   Diet Heart Healthy Diet    Drug/Food Interactions Statins/Certain Fruits      Personal Nutrition Goals   Nutrition Goal Patient to improve diet quality by using the plate method as a guide for meal planning to include lean protein/plant protein, fruits, vegetables, whole grains, nonfat dairy as part of a well-balanced diet.    Comments Goals in progress. Adisynn reports eating three meals per day and eating a wide variety of foods including lean protein, fruits and vegetables. She seldom eats out or fries foods. She reports overeating salt and sweets; however, she is not receptive to  cutting back on these things at this time. Encouraged patient to priortize high potassium/high fiber foods including fruits and vegetables; she remains receptive to this. She has maintained her weight since starting with our program. Rether will continue to benefit from participation in pulmonary rehab for nutrition, exercise, and lifestyle modification.      Intervention Plan   Intervention Prescribe, educate and counsel regarding individualized specific dietary modifications aiming towards targeted core components such as weight, hypertension, lipid management, diabetes, heart failure and other comorbidities.;Nutrition handout(s) given to patient.    Expected Outcomes Short Term Goal: Understand basic principles of dietary content, such as calories, fat, sodium, cholesterol and nutrients.;Long Term Goal: Adherence to prescribed nutrition plan.             Nutrition Discharge:  Nutrition Assessments - 05/19/23 1154       Rate Your Plate Scores   Pre Score 60    Post Score 69             Education Questionnaire Score:  Knowledge Questionnaire Score - 05/19/23 1115       Knowledge Questionnaire Score   Post Score  18/18             Goals reviewed with patient; copy given to patient.

## 2023-06-10 ENCOUNTER — Telehealth: Payer: Self-pay | Admitting: Pulmonary Disease

## 2023-06-10 NOTE — Telephone Encounter (Signed)
Attempted to contact patient x2. No answer. Left voicemail with callback number.

## 2023-06-10 NOTE — Telephone Encounter (Signed)
PT waiting for call back from heart Dr. Now. States she may be having a COPD episode. She is having chest pains. Triage here not avail. Please call @ [phone]

## 2023-06-10 NOTE — Telephone Encounter (Signed)
Re-rerouting to [facility]. No reply to PT when sent High Priority to [facility]

## 2023-06-11 NOTE — Telephone Encounter (Signed)
Was able to reach patient on 06/11/23. She reports she went to the ED yesterday for chest pain and found with pneumonia on CT scan. Discharged home and currently being treated for pneumonia. Advised patient to contact our office if symptoms not improving after the weekend. She expressed appreciation for the call and will call if symptoms worsen.

## 2023-06-14 ENCOUNTER — Encounter (HOSPITAL_BASED_OUTPATIENT_CLINIC_OR_DEPARTMENT_OTHER): Payer: Self-pay | Admitting: Pulmonary Disease

## 2023-06-14 DIAGNOSIS — J189 Pneumonia, unspecified organism: Secondary | ICD-10-CM

## 2023-06-15 MED ORDER — LEVOFLOXACIN 500 MG PO TABS: 500.0000 mg | ORAL_TABLET | Freq: Every day | ORAL | 0 refills | Status: AC

## 2023-06-15 NOTE — Telephone Encounter (Signed)
Virtual Visit via Telephone Note  I connected with DELONNA NEY on 06/15/23 at  by telephone and verified that I am speaking with the correct person using two identifiers.  Location: Patient: Home Provider: [facility]   I discussed the limitations, risks, security and privacy concerns of performing an evaluation and management service by telephone and the availability of in person appointments. I also discussed with the patient that there may be a patient responsible charge related to this service. The patient expressed understanding and agreed to proceed.   History of Present Illness: She reports continued chest pain and productive cough despite completing course of azithromycin. Denies fevers or chills. Still not able to do activity like laundry or other chores in the house.   Observations/Objective: No acute distress on the phone  Assessment and Plan: CAP - not responsive to azithro. Start Levaquin 500 mg x 7 days.  If symptoms persist, will need repeat CXR and consider bronchscopy  Follow Up Instructions: 06/18/23 at 10 AM   I discussed the assessment and treatment plan with the patient. The patient was provided an opportunity to ask questions and all were answered. The patient agreed with the plan and demonstrated an understanding of the instructions.   The patient was advised to call back or seek an in-person evaluation if the symptoms worsen or if the condition fails to improve as anticipated.  I provided 15 minutes of non-face-to-face time during this encounter.   Able Malloy Mechele Collin, MD

## 2023-06-15 NOTE — Telephone Encounter (Signed)
Pt. Calling back needs med advise

## 2023-06-18 ENCOUNTER — Encounter (HOSPITAL_BASED_OUTPATIENT_CLINIC_OR_DEPARTMENT_OTHER): Payer: Self-pay | Admitting: Pulmonary Disease

## 2023-06-18 ENCOUNTER — Ambulatory Visit (HOSPITAL_BASED_OUTPATIENT_CLINIC_OR_DEPARTMENT_OTHER): Payer: Medicare Other | Admitting: Pulmonary Disease

## 2023-06-18 VITALS — BP 122/68 | HR 66 | Temp 97.9°F | Ht 69.0 in | Wt 171.4 lb

## 2023-06-18 DIAGNOSIS — J189 Pneumonia, unspecified organism: Secondary | ICD-10-CM

## 2023-06-18 MED ORDER — ALPRAZOLAM 1 MG PO TABS: 1.0000 mg | ORAL_TABLET | Freq: Every evening | ORAL | 0 refills | Status: DC | PRN

## 2023-06-18 NOTE — Progress Notes (Unsigned)
Subjective:   PATIENT ID: [name] GENDER: female DOB: Aug 25, 1946, MRN: [id number]   HPI  Chief Complaint  Patient presents with   Acute Visit    Sob, pt does feel better after the Levaquin. Does have 4 doses left.    Reason for Visit: Follow-up COPD/asthma  Ms. Lakeitha Basques is a 77 year old female with asthma, atrial fibrillation s/p ablations and cardioversions, HTN, HLD and chronic headaches who presents for follow-up asthma/COPD management.  Synopsis: She was diagnosed with asthma as a teen and COPD in 2019. She is on Trelegy, xopenex and Singulair. She reports that she has had pneumonia four times from Aug 2021- Aug 2022. Has persistent productive cough that previously interrupted her ability to speak and be in public.   2022 Aug - Established care with [facility]. Exacerbation in 10/2021 2023 Exacerbation in Jan and May. ED visit in December for CAP c/b atrial fib. Using flutter valve and nebs.   01/22/23 Since our last visit she was treated for exacerbation in May. She has been to the ED with for atrial fibrillation, triggered by URI. She wishes to better control her lung issues to minimize her heart issues. She is compliant with her Trelegy daily and has been needing nebulizer daily. She reports sometimes a productive cough. Has sensation of mucous stuck in her throat. Denies fevers or chills. No using mucinex  04/22/23 Since our last visit she is overall doing well. Does still have cough during the day during church and will keep her up at night. Uses xopenex four times a week. Does have nasal congestion. Does not use flonase due to loss of smell. Has taken PPI for >2 months. Has not tried any OTC cough syrups. Has been participating pulmonary rehab and feels more fit. She is near graduation and looking for options after this. Considering [facility]. Reports no respiratory illness since Dec 2023 and attributes this to exercising.  06/18/23 Since our last visit  she has been to the ED 05/06/23 for chest tightness at [facility] and treated for COPD but did not take prednisone due to concern for atrial fibrillation. Again presented to ED on 06/10/23 and diagnosed with pneumonia and treated with azithromycin. CTA 06/10/23 neg for PE and bilateral lower lobe consolidation. She had contacted our office via telephone on 7/14 for persistent symptoms and started Levaquin 500 mg x 7 days. She does feel improved with less weakness. Cough somewhat improved. Planned for pacemaker   Asthma Control Test ACT Total Score  06/18/2023 10:14 AM 12  02/06/2022 10:08 AM 22   Social History: Former smoker. Smoked 1 ppd x 10 years. Quit in 1985. Retired Runner, broadcasting/film/video Significant second smoke exposure Mom had asthma Moving in with her sister  Past Medical History:  Diagnosis Date   Atrial fibrillation (HCC)    Atrial flutter (HCC)    COPD (chronic obstructive pulmonary disease) (HCC)    Eczema    Hyperlipidemia    Hypothyroid    Urinary incontinence      Family History  Problem Relation Age of Onset   Melanoma Sister    Breast cancer Sister 102   Endometrial cancer Sister      Social History   Occupational History   Not on file  Tobacco Use   Smoking status: Former    Current packs/day: 0.00    Average packs/day: 1 pack/day for 22.0 years (22.0 ttl pk-yrs)    Types: Cigarettes    Start date: 1963  Quit date: 31    Years since quitting: 39.5   Smokeless tobacco: Never  Vaping Use   Vaping status: Never Used  Substance and Sexual Activity   Alcohol use: Yes    Comment: drinks 1 drink per day of all the above   Drug use: Never   Sexual activity: Not Currently    Allergies  Allergen Reactions   Penicillins     Unknown reaction    Quinapril Swelling    Swelling of face    Latex Itching   Tessalon [Benzonatate] Swelling    Lip swelling. No throat swelling. Improved after stopping and taking benadryl   Formaldehyde Itching     Outpatient Medications  Prior to Visit  Medication Sig Dispense Refill   acyclovir (ZOVIRAX) 400 MG tablet Take 400 mg by mouth 3 (three) times daily as needed (fever blisters).     apixaban (ELIQUIS) 5 MG TABS tablet Take 1 tablet (5 mg total) by mouth 2 (two) times daily. Please notify new Cardiologist for refills. Thanks. 60 tablet 0   atorvastatin (LIPITOR) 10 MG tablet Take 10 mg by mouth at bedtime.     chlorpheniramine (CHLOR-TRIMETON) 4 MG tablet Take 4 mg by mouth See admin instructions. Take 4 mg twice daily, may increase to 4 times daily as needed for allergies     Cholecalciferol (VITAMIN D) 50 MCG (2000 UT) tablet Take 2,000 Units by mouth 2 (two) times daily.     clobetasol ointment (TEMOVATE) 0.05 % Apply 1 application  topically daily as needed (eczema).     diltiazem (CARDIZEM CD) 240 MG 24 hr capsule Take 1 capsule (240 mg total) by mouth daily. (Patient taking differently: Take 240 mg by mouth daily. Pt stated they increased her dose to 240mg  when cards D/Cd her Multaq) 90 capsule 3   Fluticasone-Umeclidin-Vilant (TRELEGY ELLIPTA) 200-62.5-25 MCG/INH AEPB Inhale 1 puff into the lungs daily.     hydrocortisone 2.5 % cream Apply 1 Application topically daily as needed (facial eczema).     Ketotifen Fumarate (ALAWAY OP) Place 1 drop into both eyes daily as needed (allergies).     levalbuterol (XOPENEX HFA) 45 MCG/ACT inhaler Inhale 2 puffs into the lungs 4 (four) times daily as needed for shortness of breath or wheezing. 1 each 5   levalbuterol (XOPENEX) 0.63 MG/3ML nebulizer solution Take 3 mLs (0.63 mg total) by nebulization every 6 (six) hours as needed for wheezing or shortness of breath. 360 mL 12   levofloxacin (LEVAQUIN) 500 MG tablet Take 1 tablet (500 mg total) by mouth daily for 7 days. 7 tablet 0   losartan (COZAAR) 50 MG tablet Take 1 tablet (50 mg total) by mouth 2 (two) times daily. 180 tablet 2   Lutein-Zeaxanthin 25-5 MG CAPS Take 1 capsule by mouth at bedtime.     montelukast (SINGULAIR) 10  MG tablet Take 10 mg by mouth at bedtime.     naproxen sodium (ALEVE) 220 MG tablet Take 220 mg by mouth daily as needed (headaches).     omeprazole (PRILOSEC OTC) 20 MG tablet Take 20 mg by mouth daily before breakfast.     OVER THE COUNTER MEDICATION Take 250 mg by mouth daily. Magnesium 250mg      OVER THE COUNTER MEDICATION Take 500 mcg by mouth daily. Vitamin B12     Probiotic Product (PROBIOTIC-10 PO) Take 1 tablet by mouth daily.     sodium chloride (OCEAN) 0.65 % SOLN nasal spray Place 1 spray into both nostrils as needed  for congestion.     levothyroxine (SYNTHROID) 50 MCG tablet Take 50 mcg by mouth daily.     No facility-administered medications prior to visit.    ROS   Objective:   Vitals:   06/18/23 1015  BP: 122/68  Pulse: 66  Temp: 97.9 F (36.6 C)  SpO2: 95%  Weight: 171 lb 6.4 oz (77.7 kg)  Height: 5\' 9"  (1.753 m)  SpO2: 95 %  Physical Exam: General: Well-appearing, no acute distress HENT: [place], AT Eyes: EOMI, no scleral icterus Respiratory: Clear to auscultation bilaterally.  No crackles, wheezing or rales Cardiovascular: RRR, -M/R/G, no JVD Extremities:-Edema,-tenderness Neuro: AAO x4, CNII-XII grossly intact Psych: Normal mood, normal affect  Data Reviewed:  Imaging: CTA 05/08/21 - No pulmonary embolus, patchy bilateral lower lobe airspace consolidation left> right, 4 mm right upper lobe perifissural nodule CXR 12/24/2021-right perihilar pneumonia CTA 06/10/23 ([facility] report only) - neg for PE and bilateral lower lobe consolidation  PFT: 01/26/23 FVC 2.41 (72%) FEV1 1.53 (61%) Ratio 64  TLC 121% TV 178% RV/TLC 141% DLCO 101% Interpretation: Moderate obstructive defect with significant bronchodilator response in FVC. Hyperinflation and air trapping present.  Labs: Alpha 1-antitrypsin MZ - 91 (LLN 101) Immunoglobulins G/A/M - normal range Eosinophil 05/22/21 - 0    Assessment & Plan:   Discussion: 77 year old female former smoker with COPD-asthma  overlap and recurrent exacerbations +/- pneumonia at least 2-3 times annually. Last treated for pneumonia 10/2022. Discussed clinical course and management of COPD/asthma including bronchodilator regimen, preventive care including vaccinations and action plan for exacerbation.  Exacerbation resolving  COPD-asthma overlap  --Complete antibiotics --CONTINUE Trelegy 200-62.5-25 mcg ONE puff ONCE a day. --CONTINUE flutter valve twice a day AS NEEDED --CONTINUE Xopenex nebulizers twice a day followed by flutter valve. --CONTINUE singulair 10 mg daily   Hx recurrent pneumonia Dense consolidation in LLL. Likely area high risk for recurrent pneumonia --In the future if you develop s/sx concerning for pneumonia, will need two week course of antibiotics --ORDER CT Chest without contrast in 6 weeks --Will consider bronchoscopy if symptoms persistent and abnormal CT scan  **If needs CT need in the future, she has used alprazolam 1-2 mg PRN for anxiety**  Health Maintenance Immunization History  Administered Date(s) Administered   Influenza Inj Mdck Quad Pf 10/21/2013, 09/01/2019, 09/10/2020   Influenza Split 10/16/2004, 11/08/2009, 09/28/2013   Influenza, High Dose Seasonal PF 10/17/2016, 09/15/2017, 08/29/2021, 08/21/2022   Influenza, Seasonal, Injecte, Preservative Fre 09/23/2012   Influenza,inj,Quad PF,6+ Mos 09/18/2014, 09/21/2015   Influenza,inj,quad, With Preservative 08/31/2017, 10/07/2017, 09/07/2018, 09/02/2019   Influenza-Unspecified 10/21/2013   Moderna Sars-Covid-2 Vaccination 12/20/2019, 01/17/2020, 10/08/2020, 04/02/2021   PNEUMOCOCCAL CONJUGATE-20 02/19/2023   Pfizer Covid-19 Vaccine Bivalent Booster 24yrs & up 09/29/2021   Pneumococcal Conjugate-13 05/28/2015, 05/27/2016   Pneumococcal Polysaccharide-23 12/21/2008, 12/01/2013, 05/31/2014   Tdap 04/03/2017, 02/19/2023   Zoster, Live 12/01/2013   CT Lung Screen - discuss for CT  No orders of the defined types were placed in  this encounter.  No orders of the defined types were placed in this encounter.  No follow-ups on file.  I have spent a total time of 35-minutes on the day of the appointment including chart review, data review, collecting history, coordinating care and discussing medical diagnosis and plan with the patient/family. Past medical history, allergies, medications were reviewed. Pertinent imaging, labs and tests included in this note have been reviewed and interpreted independently by me.  Pleshette Tomasini Mechele Collin, MD [facility] 06/18/2023 10:19 AM  Office  Number [phone]

## 2023-06-18 NOTE — Patient Instructions (Addendum)
Hx recurrent pneumonia --ORDER CT Chest without contrast in 6 weeks --Will consider bronchoscopy if symptoms persistent and abnormal CT scan  COPD-asthma overlap  --CONTINUE Trelegy 200-62.5-25 mcg ONE puff ONCE a day. --CONTINUE flutter valve twice a day AS NEEDED --CONTINUE Xopenex nebulizers twice a day followed by flutter valve. --CONTINUE singulair 10 mg daily

## 2023-06-23 ENCOUNTER — Encounter (HOSPITAL_BASED_OUTPATIENT_CLINIC_OR_DEPARTMENT_OTHER): Payer: Self-pay | Admitting: Pulmonary Disease

## 2023-06-25 HISTORY — PX: OTHER SURGICAL HISTORY: SHX169

## 2023-06-26 ENCOUNTER — Telehealth (HOSPITAL_COMMUNITY): Payer: Self-pay

## 2023-06-26 NOTE — Telephone Encounter (Signed)
Called pt to let them know about the Pulmonary Wellness program at [facility] Pt states they are interested. Will put referral in.

## 2023-07-08 ENCOUNTER — Other Ambulatory Visit: Payer: Self-pay | Admitting: Cardiology

## 2023-07-08 DIAGNOSIS — I4819 Other persistent atrial fibrillation: Secondary | ICD-10-CM

## 2023-07-08 NOTE — Telephone Encounter (Signed)
Pt now seen by [facility].

## 2023-07-20 ENCOUNTER — Encounter (HOSPITAL_BASED_OUTPATIENT_CLINIC_OR_DEPARTMENT_OTHER): Payer: Self-pay | Admitting: Pulmonary Disease

## 2023-07-23 ENCOUNTER — Ambulatory Visit (HOSPITAL_BASED_OUTPATIENT_CLINIC_OR_DEPARTMENT_OTHER)
Admission: RE | Admit: 2023-07-23 | Discharge: 2023-07-23 | Disposition: A | Discharge status: Home / Self Care | Payer: Medicare Other | Source: Ambulatory Visit | Attending: Pulmonary Disease | Admitting: Pulmonary Disease

## 2023-07-23 DIAGNOSIS — J189 Pneumonia, unspecified organism: Secondary | ICD-10-CM

## 2023-07-29 ENCOUNTER — Encounter (HOSPITAL_BASED_OUTPATIENT_CLINIC_OR_DEPARTMENT_OTHER): Payer: Self-pay | Admitting: Pulmonary Disease

## 2023-07-29 ENCOUNTER — Ambulatory Visit (HOSPITAL_BASED_OUTPATIENT_CLINIC_OR_DEPARTMENT_OTHER): Payer: Medicare Other | Admitting: Pulmonary Disease

## 2023-07-29 VITALS — BP 138/78 | HR 68 | Resp 18 | Ht 69.0 in | Wt 165.8 lb

## 2023-07-29 DIAGNOSIS — J189 Pneumonia, unspecified organism: Secondary | ICD-10-CM

## 2023-07-29 DIAGNOSIS — J4489 Other specified chronic obstructive pulmonary disease: Secondary | ICD-10-CM

## 2023-07-29 NOTE — Patient Instructions (Addendum)
COPD-asthma overlap  --CONTINUE Trelegy 200-62.5-25 mcg ONE puff ONCE a day. --CONTINUE flutter valve twice a day AS NEEDED --CONTINUE Xopenex nebulizers twice a day followed by flutter valve. --CONTINUE singulair 10 mg daily --Encourage influenza, RSV, covid vaccine for prophylactic  Hx recurrent pneumonia CT with improved dense consolidation in LLL. Likely area high risk for recurrent pneumonia --In the future if you develop s/sx concerning for pneumonia, will need two week course of antibiotics --ORDER CT Chest without contrast in 6 month --No bronchoscopy indicated unless symptoms recur/persistent and abnormal CT scan

## 2023-07-29 NOTE — Progress Notes (Signed)
Subjective:   PATIENT ID: Christine Moore GENDER: female DOB: Feb 02, 1946, MRN: [id number]   HPI  Chief Complaint  Patient presents with   2 Week Follow-up    3 month FU, breathing has been very good since last visit.    Reason for Visit: Follow-up COPD/asthma  Christine Moore is a 77 year old female with asthma, atrial fibrillation s/p ablations and cardioversions, HTN, HLD and chronic headaches who presents for follow-up asthma/COPD management.  Synopsis: She was diagnosed with asthma as a teen and COPD in 2019. She is on Trelegy, xopenex and Singulair. She reports that she has had pneumonia four times from Aug 2021- Aug 2022. Has persistent productive cough that previously interrupted her ability to speak and be in public.   2022 Aug - Established care with [facility]. Exacerbation in 10/2021 2023 Exacerbation in Jan and May and December 2024 - Persistent cough. Trialed PPI without change. Completed pulmonary rehab. COPD exacerbation in June and July. CTA 06/10/23 neg for PE and bilateral lower lobe consolidation. PM placed in July and treated with linezolid for SSI. Cough resolved completely after this antibiotic.  07/29/23 Since our last visit she had PM placed on 06/25/23 and was prescribed linezolid for possible skin infection after the procedure. Since cough completely resolved. She is compliant with Trelegy. No recent albuterol use but using nebulizer once a day. Denies shortness of breath or wheezing.    Asthma Control Test ACT Total Score  07/29/2023 10:28 AM 25  06/18/2023 10:14 AM 12  02/06/2022 10:08 AM 22   Social History: Former smoker. Smoked 1 ppd x 10 years. Quit in 1985. Retired Runner, broadcasting/film/video Significant second smoke exposure Mom had asthma Moving in with her sister  Past Medical History:  Diagnosis Date   Atrial fibrillation (HCC)    Atrial flutter (HCC)    COPD (chronic obstructive pulmonary disease) (HCC)    Eczema    Hyperlipidemia    Hypothyroid     Urinary incontinence      Family History  Problem Relation Age of Onset   Melanoma Sister    Breast cancer Sister 9   Endometrial cancer Sister      Social History   Occupational History   Not on file  Tobacco Use   Smoking status: Former    Current packs/day: 0.00    Average packs/day: 1 pack/day for 22.0 years (22.0 ttl pk-yrs)    Types: Cigarettes    Start date: 1963    Quit date: 73    Years since quitting: 39.6   Smokeless tobacco: Never  Vaping Use   Vaping status: Never Used  Substance and Sexual Activity   Alcohol use: Yes    Comment: drinks 1 drink per day of all the above   Drug use: Never   Sexual activity: Not Currently    Allergies  Allergen Reactions   Penicillins     Unknown reaction    Quinapril Swelling    Swelling of face    Latex Itching   Tessalon [Benzonatate] Swelling    Lip swelling. No throat swelling. Improved after stopping and taking benadryl   Formaldehyde Itching     Outpatient Medications Prior to Visit  Medication Sig Dispense Refill   acyclovir (ZOVIRAX) 400 MG tablet Take 400 mg by mouth 3 (three) times daily as needed (fever blisters).     apixaban (ELIQUIS) 5 MG TABS tablet Take 1 tablet (5 mg total) by mouth 2 (two) times daily. Please notify new  Cardiologist for refills. Thanks. 60 tablet 0   atorvastatin (LIPITOR) 10 MG tablet Take 10 mg by mouth at bedtime.     chlorpheniramine (CHLOR-TRIMETON) 4 MG tablet Take 4 mg by mouth See admin instructions. Take 4 mg twice daily, may increase to 4 times daily as needed for allergies     Cholecalciferol (VITAMIN D) 50 MCG (2000 UT) tablet Take 2,000 Units by mouth 2 (two) times daily.     clobetasol ointment (TEMOVATE) 0.05 % Apply 1 application  topically daily as needed (eczema).     diltiazem (CARDIZEM CD) 240 MG 24 hr capsule Take 1 capsule (240 mg total) by mouth daily. (Patient taking differently: Take 240 mg by mouth daily. Pt stated they increased her dose to 240mg  when  cards D/Cd her Multaq) 90 capsule 3   Fluticasone-Umeclidin-Vilant (TRELEGY ELLIPTA) 200-62.5-25 MCG/INH AEPB Inhale 1 puff into the lungs daily.     hydrocortisone 2.5 % cream Apply 1 Application topically daily as needed (facial eczema).     Ketotifen Fumarate (ALAWAY OP) Place 1 drop into both eyes daily as needed (allergies).     levalbuterol (XOPENEX HFA) 45 MCG/ACT inhaler Inhale 2 puffs into the lungs 4 (four) times daily as needed for shortness of breath or wheezing. 1 each 5   levalbuterol (XOPENEX) 0.63 MG/3ML nebulizer solution Take 3 mLs (0.63 mg total) by nebulization every 6 (six) hours as needed for wheezing or shortness of breath. 360 mL 12   losartan (COZAAR) 50 MG tablet Take 1 tablet (50 mg total) by mouth 2 (two) times daily. 180 tablet 2   Lutein-Zeaxanthin 25-5 MG CAPS Take 1 capsule by mouth at bedtime.     montelukast (SINGULAIR) 10 MG tablet Take 10 mg by mouth at bedtime.     naproxen sodium (ALEVE) 220 MG tablet Take 220 mg by mouth daily as needed (headaches).     omeprazole (PRILOSEC OTC) 20 MG tablet Take 20 mg by mouth daily before breakfast.     OVER THE COUNTER MEDICATION Take 250 mg by mouth daily. Magnesium 250mg      OVER THE COUNTER MEDICATION Take 500 mcg by mouth daily. Vitamin B12     Probiotic Product (PROBIOTIC-10 PO) Take 1 tablet by mouth daily.     sodium chloride (OCEAN) 0.65 % SOLN nasal spray Place 1 spray into both nostrils as needed for congestion.     ALPRAZolam (XANAX) 1 MG tablet Take 1 tablet (1 mg total) by mouth at bedtime as needed (30 min prior to CT scan). 1 tablet 0   levothyroxine (SYNTHROID) 50 MCG tablet Take 50 mcg by mouth daily.     No facility-administered medications prior to visit.    Review of Systems  Constitutional:  Negative for chills, diaphoresis, fever, malaise/fatigue and weight loss.  HENT:  Negative for congestion.   Respiratory:  Negative for cough, hemoptysis, sputum production, shortness of breath and wheezing.    Cardiovascular:  Negative for chest pain, palpitations and leg swelling.     Objective:   Vitals:   07/29/23 1016  BP: 138/78  Pulse: 68  Resp: 18  SpO2: 98%  Weight: 165 lb 12.8 oz (75.2 kg)  Height: 5\' 9"  (1.753 m)   SpO2: 98 %  Physical Exam: General: Well-appearing, no acute distress HENT: [place], AT Eyes: EOMI, no scleral icterus Respiratory: Clear to auscultation bilaterally.  No crackles, wheezing or rales Cardiovascular: RRR, -M/R/G, no JVD Extremities:-Edema,-tenderness Neuro: AAO x4, CNII-XII grossly intact Psych: Normal mood, normal affect  Data Reviewed:  Imaging: CTA 05/08/21 - No pulmonary embolus, patchy bilateral lower lobe airspace consolidation left> right, 4 mm right upper lobe perifissural nodule CXR 12/24/2021-right perihilar pneumonia CTA 06/10/23 ([facility] report only) - neg for PE and bilateral lower lobe consolidation CT Chest 07/23/23 - Resolved bilateral lobe consolidation with atelectasis and bandlike scarring in LLL  PFT: 01/26/23 FVC 2.41 (72%) FEV1 1.53 (61%) Ratio 64  TLC 121% TV 178% RV/TLC 141% DLCO 101% Interpretation: Moderate obstructive defect with significant bronchodilator response in FVC. Hyperinflation and air trapping present.  Labs: Alpha 1-antitrypsin MZ - 91 (LLN 101) Immunoglobulins G/A/M - normal range Eosinophil 05/22/21 - 0    Assessment & Plan:   Discussion: 77 year old female former smoker with COPD-asthma overlap and recurrent exacerbations +/- pneumonia at least 2-3 times annually. Had 6 month period with no exacerbations from 12/2022-05/2023 until June/July 2024. Has been slow to resolve since then. CTA at [facility] commented on bilateral lobe consolidation though this is likely chronic. Todays scan improved with LLL scarring/dense infiltrate. Discussed clinical course and management of COPD/asthma including bronchodilator regimen, preventive care including vaccinations and action plan for exacerbation.  COPD-asthma overlap   --CONTINUE Trelegy 200-62.5-25 mcg ONE puff ONCE a day. --CONTINUE flutter valve twice a day AS NEEDED --CONTINUE Xopenex nebulizers twice a day followed by flutter valve. --CONTINUE singulair 10 mg daily  Hx recurrent pneumonia CT with improved dense consolidation in LLL. Likely area high risk for recurrent pneumonia --In the future if you develop s/sx concerning for pneumonia, will need two week course of antibiotics --ORDER CT Chest without contrast in 6 month --No bronchoscopy indicated unless symptoms recur/persistent and abnormal CT scan  **If needs CT need in the future, she has used alprazolam 1-2 mg PRN for anxiety**  Health Maintenance Immunization History  Administered Date(s) Administered   Influenza Inj Mdck Quad Pf 10/21/2013, 09/01/2019, 09/10/2020   Influenza Split 10/16/2004, 11/08/2009, 09/28/2013   Influenza, High Dose Seasonal PF 10/17/2016, 09/15/2017, 08/29/2021, 08/21/2022   Influenza, Seasonal, Injecte, Preservative Fre 09/23/2012   Influenza,inj,Quad PF,6+ Mos 09/18/2014, 09/21/2015   Influenza,inj,quad, With Preservative 08/31/2017, 10/07/2017, 09/07/2018, 09/02/2019   Influenza-Unspecified 10/21/2013   Moderna Sars-Covid-2 Vaccination 12/20/2019, 01/17/2020, 10/08/2020, 04/02/2021   PNEUMOCOCCAL CONJUGATE-20 02/19/2023   Pfizer Covid-19 Vaccine Bivalent Booster 91yrs & up 09/29/2021   Pneumococcal Conjugate-13 05/28/2015, 05/27/2016   Pneumococcal Polysaccharide-23 12/21/2008, 12/01/2013, 05/31/2014   Tdap 04/03/2017, 02/19/2023   Zoster, Live 12/01/2013   CT Lung Screen - not qualified due to frequent CT  Orders Placed This Encounter  Procedures   CT Chest Wo Contrast    Standing Status:   Future    Standing Expiration Date:   01/29/2024    Order Specific Question:   Preferred imaging location?    Answer:   [facility]   No orders of the defined types were placed in this encounter.  Return in about 6 months (around 01/29/2024) for after  CT scan.  I have spent a total time of 30-minutes on the day of the appointment including chart review, data review, collecting history, coordinating care and discussing medical diagnosis and plan with the patient/family. Past medical history, allergies, medications were reviewed. Pertinent imaging, labs and tests included in this note have been reviewed and interpreted independently by me.  Makina Skow Mechele Collin, MD [facility] 07/29/2023 10:31 AM  Office Number [phone]

## 2023-10-14 ENCOUNTER — Encounter (HOSPITAL_BASED_OUTPATIENT_CLINIC_OR_DEPARTMENT_OTHER): Payer: Self-pay | Admitting: Pulmonary Disease

## 2023-10-23 ENCOUNTER — Encounter (HOSPITAL_BASED_OUTPATIENT_CLINIC_OR_DEPARTMENT_OTHER): Payer: Self-pay | Admitting: Emergency Medicine

## 2023-10-23 ENCOUNTER — Emergency Department (HOSPITAL_BASED_OUTPATIENT_CLINIC_OR_DEPARTMENT_OTHER)
Admission: EM | Admit: 2023-10-23 | Discharge: 2023-10-23 | Disposition: A | Discharge status: Home / Self Care | Payer: Medicare Other | Attending: Emergency Medicine | Admitting: Emergency Medicine

## 2023-10-23 ENCOUNTER — Other Ambulatory Visit (HOSPITAL_BASED_OUTPATIENT_CLINIC_OR_DEPARTMENT_OTHER): Payer: Self-pay

## 2023-10-23 ENCOUNTER — Emergency Department (HOSPITAL_BASED_OUTPATIENT_CLINIC_OR_DEPARTMENT_OTHER): Payer: Medicare Other | Admitting: Radiology

## 2023-10-23 DIAGNOSIS — J449 Chronic obstructive pulmonary disease, unspecified: Secondary | ICD-10-CM | POA: Diagnosis not present

## 2023-10-23 DIAGNOSIS — Z950 Presence of cardiac pacemaker: Secondary | ICD-10-CM | POA: Insufficient documentation

## 2023-10-23 DIAGNOSIS — D72829 Elevated white blood cell count, unspecified: Secondary | ICD-10-CM | POA: Insufficient documentation

## 2023-10-23 DIAGNOSIS — Z7901 Long term (current) use of anticoagulants: Secondary | ICD-10-CM | POA: Diagnosis not present

## 2023-10-23 DIAGNOSIS — E039 Hypothyroidism, unspecified: Secondary | ICD-10-CM | POA: Diagnosis not present

## 2023-10-23 DIAGNOSIS — Z79899 Other long term (current) drug therapy: Secondary | ICD-10-CM | POA: Diagnosis not present

## 2023-10-23 DIAGNOSIS — R0789 Other chest pain: Secondary | ICD-10-CM | POA: Insufficient documentation

## 2023-10-23 DIAGNOSIS — J189 Pneumonia, unspecified organism: Secondary | ICD-10-CM | POA: Diagnosis not present

## 2023-10-23 DIAGNOSIS — Z91040 Latex allergy status: Secondary | ICD-10-CM | POA: Insufficient documentation

## 2023-10-23 LAB — BASIC METABOLIC PANEL
Anion gap: 8 (ref 5–15)
BUN: 14 mg/dL (ref 8–23)
CO2: 23 mmol/L (ref 22–32)
Calcium: 9.3 mg/dL (ref 8.9–10.3)
Chloride: 104 mmol/L (ref 98–111)
Creatinine, Ser: 0.54 mg/dL (ref 0.44–1.00)
GFR, Estimated: >60 mL/min (ref >60)
Glucose, Bld: 102 mg/dL — ABNORMAL HIGH (ref 70–99)
Potassium: 3.8 mmol/L (ref 3.5–5.1)
Sodium: 135 mmol/L (ref 135–145)

## 2023-10-23 LAB — URINALYSIS, ROUTINE W REFLEX MICROSCOPIC
Bilirubin Urine: NEGATIVE
Glucose, UA: NEGATIVE mg/dL
Hgb urine dipstick: NEGATIVE
Ketones, ur: NEGATIVE mg/dL
Leukocytes,Ua: NEGATIVE
Nitrite: NEGATIVE
Protein, ur: NEGATIVE mg/dL
Specific Gravity, Urine: <1.005 — ABNORMAL LOW (ref 1.005–1.030)
pH: 6 (ref 5.0–8.0)

## 2023-10-23 LAB — CBC
HCT: 37.1 % (ref 36.0–46.0)
Hemoglobin: 12.3 g/dL (ref 12.0–15.0)
MCH: 28.9 pg (ref 26.0–34.0)
MCHC: 33.2 g/dL (ref 30.0–36.0)
MCV: 87.1 fL (ref 80.0–100.0)
Platelets: 347 10*3/uL (ref 150–400)
RBC: 4.26 MIL/uL (ref 3.87–5.11)
RDW: 13.3 % (ref 11.5–15.5)
WBC: 17.4 10*3/uL — ABNORMAL HIGH (ref 4.0–10.5)
nRBC: 0 % (ref 0.0–0.2)

## 2023-10-23 LAB — TROPONIN I (HIGH SENSITIVITY)
Troponin I (High Sensitivity): 10 ng/L (ref <18)
Troponin I (High Sensitivity): 13 ng/L (ref <18)

## 2023-10-23 MED ORDER — AZITHROMYCIN 250 MG PO TABS
250.0000 mg | ORAL_TABLET | Freq: Every day | ORAL | 0 refills | Status: DC
  Filled 2023-10-23: qty 6, 5d supply, fill #0

## 2023-10-23 MED ORDER — PREDNISONE 10 MG PO TABS
ORAL_TABLET | ORAL | 0 refills | Status: AC
  Filled 2023-10-23: qty 20, 8d supply, fill #0

## 2023-10-23 MED ORDER — CEPHALEXIN 500 MG PO CAPS
500.0000 mg | ORAL_CAPSULE | Freq: Three times a day (TID) | ORAL | 0 refills | Status: AC
  Filled 2023-10-23: qty 21, 7d supply, fill #0

## 2023-10-23 NOTE — ED Triage Notes (Signed)
Pt c/o chest tightness today. Also reports lower ABD pain, currently taking doxycycline for UTI. Referred by PCP for XR. Endorses cough

## 2023-10-23 NOTE — ED Provider Notes (Signed)
[facility] EMERGENCY DEPARTMENT AT [facility] Provider Note   CSN: [id number] Arrival date & time: 10/23/23  1127     History  Chief Complaint  Patient presents with   Chest Pain    Christine Moore is a 77 y.o. female.  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  Wondering if have pneumonia. Has had cough worsening over the past week, fatigue this AM. No shortness of breath.  Chest tightness began this AM, no pain per se.  Coughing up nasty stuffy, mostly yellow, sometimes near brown.  Congestion also for past week.  No sore throat.  No known fevers. Has had some chills. No nausea or vomiting. Right leg after the ablation has had bruising, pain to back of leg radiating down.   Since ablation in October. For about a week has also had lower back pain.   On doxycycline now for UTI diagnosed a few weeks ago (had urinary symptoms, was on keflex, then told to stop, then put on doxy and has 2 days left) Someone had called in prednisone but has not started it.   Taking eliquis has not missed. Had urinary symptoms before but does not now.  No n/v       Past Medical History:  Diagnosis Date   Atrial fibrillation (HCC)    Atrial flutter (HCC)    COPD (chronic obstructive pulmonary disease) (HCC)    Eczema    Hyperlipidemia    Hypothyroid    Urinary incontinence      Home Medications Prior to Admission medications   Medication Sig Start Date End Date Taking? Authorizing Provider  azithromycin (ZITHROMAX) 250 MG tablet Take 1 tablet (250 mg total) by mouth daily. Take first 2 tablets together, then 1 every day until finished. 10/23/23  Yes Alvira Monday, MD  cephALEXin (KEFLEX) 500 MG capsule Take 1 capsule (500 mg total) by mouth 3 (three) times daily for 7  days. 10/23/23 10/30/23 Yes Alvira Monday, MD  acyclovir (ZOVIRAX) 400 MG tablet Take 400 mg by mouth 3 (three) times daily as needed (fever blisters). 09/19/19   [provider]  apixaban (ELIQUIS) 5 MG TABS tablet Take 1 tablet (5 mg total) by mouth 2 (two) times daily. Please notify new Cardiologist for refills. Thanks. 05/13/23   Lanier Prude, MD  atorvastatin (LIPITOR) 10 MG tablet Take 10 mg by mouth at bedtime. 08/30/20   [provider]  chlorpheniramine (CHLOR-TRIMETON) 4 MG tablet Take 4 mg by mouth See admin instructions. Take 4 mg twice daily, may increase to 4 times daily as needed for allergies    [provider]  Cholecalciferol (VITAMIN D) 50 MCG (2000 UT) tablet Take 2,000 Units by mouth 2 (two) times daily.    [provider]  clobetasol ointment (TEMOVATE) 0.05 % Apply 1 application  topically daily as needed (eczema).    [provider]  diltiazem (CARDIZEM CD) 240 MG 24 hr capsule Take 1 capsule (240 mg total) by mouth daily. Patient taking differently: Take 240 mg by mouth daily. Pt stated they increased her dose to 240mg  when cards D/Cd her Multaq 02/04/23   Lanier Prude, MD  Fluticasone-Umeclidin-Vilant (TRELEGY ELLIPTA) 200-62.5-25 MCG/INH AEPB Inhale 1 puff into the lungs daily. 04/02/20   [provider]  hydrocortisone 2.5 % cream Apply 1  Application topically daily as needed (facial eczema).    [provider]  Ketotifen Fumarate (ALAWAY OP) Place 1 drop into both eyes daily as needed (allergies).    [provider]  levalbuterol Pauline Aus HFA) 45 MCG/ACT inhaler Inhale 2 puffs into the lungs 4 (four) times daily as needed for shortness of breath or wheezing. 01/22/23   Luciano Cutter, MD  levalbuterol Pauline Aus) 0.63 MG/3ML nebulizer solution Take 3 mLs (0.63 mg total) by nebulization every 6 (six) hours as needed for wheezing or shortness of breath. 05/08/23   Luciano Cutter, MD   levothyroxine (SYNTHROID) 50 MCG tablet Take 50 mcg by mouth daily. 10/12/20 10/15/22  [provider]  losartan (COZAAR) 50 MG tablet Take 1 tablet (50 mg total) by mouth 2 (two) times daily. 10/15/22   Newman Nip, NP  Lutein-Zeaxanthin 25-5 MG CAPS Take 1 capsule by mouth at bedtime.    [provider]  montelukast (SINGULAIR) 10 MG tablet Take 10 mg by mouth at bedtime. 05/09/19   [provider]  naproxen sodium (ALEVE) 220 MG tablet Take 220 mg by mouth daily as needed (headaches).    [provider]  omeprazole (PRILOSEC OTC) 20 MG tablet Take 20 mg by mouth daily before breakfast.    [provider]  OVER THE COUNTER MEDICATION Take 250 mg by mouth daily. Magnesium 250mg     [provider]  OVER THE COUNTER MEDICATION Take 500 mcg by mouth daily. Vitamin B12    [provider]  predniSONE (DELTASONE) 10 MG tablet Take 4 tablets (40 mg total) by mouth daily with breakfast for 2 days, THEN 3 tablets (30 mg total) daily with breakfast for 2 days, THEN 2 tablets (20 mg total) daily with breakfast for 2 days, THEN 1 tablet (10 mg total) daily with breakfast for 2 days. 10/23/23 10/31/23  Luciano Cutter, MD  Probiotic Product (PROBIOTIC-10 PO) Take 1 tablet by mouth daily.    [provider]  sodium chloride (OCEAN) 0.65 % SOLN nasal spray Place 1 spray into both nostrils as needed for congestion.    [provider]      Allergies    Penicillins, Quinapril, Latex, Tessalon [benzonatate], and Formaldehyde    Review of Systems   Review of Systems  Physical Exam Updated Vital Signs BP 135/64   Pulse 79   Temp 99 F (37.2 C) (Oral)   Resp 13   Wt 76.2 kg   SpO2 96%   BMI 24.81 kg/m  Physical Exam Vitals and nursing note reviewed.  Constitutional:      General: She is not in acute distress.    Appearance: She is well-developed. She is not diaphoretic.  HENT:     Head: Normocephalic and  atraumatic.  Eyes:     Conjunctiva/sclera: Conjunctivae normal.  Cardiovascular:     Rate and Rhythm: Normal rate and regular rhythm.     Heart sounds: Normal heart sounds. No murmur heard.    No friction rub. No gallop.  Pulmonary:     Effort: Pulmonary effort is normal. No respiratory distress.     Breath sounds: Normal breath sounds. No wheezing or rales.  Abdominal:     General: There is no distension.     Palpations: Abdomen is soft.     Tenderness: There is no abdominal tenderness. There is no guarding.  Musculoskeletal:        General: No tenderness.     Cervical back: Normal range of  motion.  Skin:    General: Skin is warm and dry.     Findings: No erythema or rash.  Neurological:     Mental Status: She is alert and oriented to person, place, and time.     ED Results / Procedures / Treatments   Labs (all labs ordered are listed, but only abnormal results are displayed) Labs Reviewed  BASIC METABOLIC PANEL - Abnormal; Notable for the following components:      Result Value   Glucose, Bld 102 (*)    All other components within normal limits  CBC - Abnormal; Notable for the following components:   WBC 17.4 (*)    All other components within normal limits  URINALYSIS, ROUTINE W REFLEX MICROSCOPIC - Abnormal; Notable for the following components:   Color, Urine STRAW (*)    Specific Gravity, Urine <1.005 (*)    All other components within normal limits  URINE CULTURE  TROPONIN I (HIGH SENSITIVITY)  TROPONIN I (HIGH SENSITIVITY)    EKG EKG Interpretation Date/Time:  Friday October 23 2023 11:37:28 EST Ventricular Rate:  83 PR Interval:  84 QRS Duration:  160 QT Interval:  412 QTC Calculation: 485 R Axis:   106  Text Interpretation: VENTRICULAR PACED RHYTHM Nonspecific intraventricular conduction delay Abnormal lateral Q waves Probable anteroseptal infarct, acute >>> Acute MI <<< Confirmed by Alvira Monday ([id number]) on 10/23/2023 11:54:08 AM  Radiology DG  Chest 2 View  Result Date: 10/23/2023 CLINICAL DATA:  Chest tightness. EXAM: CHEST - 2 VIEW COMPARISON:  Chest CT scan from 07/23/2023. FINDINGS: Redemonstration of several linear areas of fibrosis/scarring in the right mid lung zone and in the bilateral lower lung zones, which corresponds to linear areas of atelectasis/scarring on the prior CT scan from 07/23/2023. Bilateral lung fields are otherwise clear. No acute consolidation or lung collapse. There is blunting of bilateral posterior costophrenic angles, which may represent trace pleural effusion versus pleural thickening. Normal cardio-mediastinal silhouette. There is a left sided 3-lead pacemaker. No acute osseous abnormalities. The soft tissues are within normal limits. IMPRESSION: *Essentially stable exam when compared to the prior chest CT scan from 07/23/2023. Please see above for details. No acute cardiopulmonary abnormality. Electronically Signed   By: Jules Schick M.D.   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   Differential diagnosis for fatigue, cough, chest tightness includes ACS, PE, COPD exacerbation, CHF exacerbation, anemia, pneumonia, viral etiology such as COVID 19 infection, metabolic abnormality, UTI.    Chest x-ray was done which showed no pneumothorax, no pulmonary edema and essentially apears stable in comparison to prior chest CT in August.   EKG was evaluated by me which showed paced rhythm new from prior I our system.    Labs personally evaluated and interpreted by me show 2 negative troponins and have low suspicion for ACS, urinalysis without signs of  infection, BMP without significant electrolyte abnormalities.  CBC is significant for white count of 17,400.  She is taking Eliquis and have low clinical suspicion for PE. She does not have wheezing on exam or dyspnea and doubt COPD exacerbation.  She reports she has had pneumonia on CT in the past  that was cold on x-ray, and clinically given concern for worsening cough over the course of a week that is productive of yellow sputum, associated significant fatigue, and leukocytosis of 17,000, do consider clinically high suspicion for pneumonia given prior occult infection in the past we will treat for "community-acquired pneumonia with Keflex and azithromycin at this time. Is on doxycycline for a different infection-can continue for 2 days with keflex then transition to azithromycin.  Patient discharged in stable condition with understanding of reasons to return.   Click here for ABCD2, HEART and other calculatorsREFRESH Note before signing :1}            Final Clinical Impression(s) / ED Diagnoses Final diagnoses:  Chest tightness  Leukocytosis, unspecified type  Community acquired pneumonia, unspecified laterality    Rx / DC Orders ED Discharge Orders          Ordered    cephALEXin (KEFLEX) 500 MG capsule  3 times daily        10/23/23 1431    azithromycin (ZITHROMAX) 250 MG tablet  Daily        10/23/23 1431              Alvira Monday, MD 10/23/23 2220

## 2023-10-23 NOTE — Addendum Note (Signed)
Addended by: Luciano Cutter on: 10/23/2023 11:18 AM   Modules accepted: Orders

## 2023-10-24 LAB — URINE CULTURE: Culture: <10000 — AB

## 2023-12-04 NOTE — Progress Notes (Signed)
 Office Note  12/04/2023  Visit Problem List:  1. Sinus node dysfunction (*)      2. Pacemaker      3. Status post atrioventricular nodal ablation      4. Right groin mass      5. 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 Atrial flutter, unspecified type (*)      7. Atrial fibrillation, unspecified type (*)         Chronic Problem List:  Patient Active Problem List  Diagnosis  . Atrial fibrillation, unspecified type (*)  . Pacemaker  . Atrial flutter, unspecified type (*)  . S/P AV nodal ablation     Medications:  Current Outpatient Medications:  .  acyclovir (ZOVIRAX) 400 mg tablet, Take one tablet (400 mg dose) by mouth with meals as needed., Disp: , Rfl:  .  apixaban  (ELIQUIS ) 5 mg tablet, Take one tablet (5 mg dose) by mouth 2 (two) times daily., Disp: 60 tablet, Rfl: 6 .  atorvastatin  calcium  (LIPITOR) 10 mg tablet, Take one tablet (10 mg dose) by mouth at bedtime., Disp: , Rfl:  .  chlorpheniramine (ALLER-CHLOR,CHLOR-TRIMETON) 4 mg tablet, Take two tablets (8 mg dose) by mouth daily., Disp: , Rfl:  .  Cholecalciferol 50 MCG (2000 UT) TABS, Take one tablet by mouth daily., Disp: , Rfl:  .  clobetasol propionate (TEMOVATE) 0.05% cream, apply qd prn, Disp: , Rfl:  .  diazepam (VALIUM) 2 mg tablet, , Disp: , Rfl:  .  diltiazem  HCl (CARDIZEM  CD) 240 mg 24 hr capsule, Take one capsule (240 mg dose) by mouth daily., Disp: 30 capsule, Rfl: 6 .  estradiol (ESTRACE) 0.1 mg/gram vaginal cream, Place two g vaginally daily., Disp: , Rfl:  .  hydrocortisone (HYTONE) 2.5 % lotion, Apply one application. topically 2 (two) times a day as needed., Disp: , Rfl:  .  levalbuterol  (XOPENEX  HFA) 45 MCG/ACT inhaler, Inhale into the lungs 4 (four) times a day as needed., Disp: , Rfl:  .  levalbuterol  (XOPENEX ) 0.63 MG/3ML nebulizer solution, Inhale 3 mLs (0.63 mg dose) into the lungs every 6 (six) hours as needed.,  Disp: , Rfl:  .  levothyroxine  sodium (SYNTHROID ,LEVOTHROID,LOVOXYL) 50 mcg tablet, Take one tablet (50 mcg dose) by mouth every morning., Disp: , Rfl:  .  losartan  potassium (COZAAR ) 100 mg tablet, Take one tablet (100 mg dose) by mouth daily., Disp: 90 tablet, Rfl: 6 .  Lutein-Zeaxanthin 25-5 MG CAPS, Take 1 capsule by mouth daily., Disp: , Rfl:  .  Magnesium  250 MG TABS, Take one tablet by mouth at bedtime., Disp: , Rfl:  .  montelukast  (SINGULAIR ) 10 MG tablet, Take one tablet (10 mg dose) by mouth at bedtime., Disp: , Rfl:  .  naproxen sodium (ALEVE) 220 mg tablet, Take one tablet (220 mg dose) by mouth daily as needed., Disp: , Rfl:  .  omeprazole (PRILOSEC OTC) 20 MG tablet, Take one tablet (20 mg dose) by mouth 1 (one) time each day before breakfast., Disp: , Rfl:  .  pantoprazole  sodium (PROTONIX ) 40 mg tablet, Take one tablet (40 mg dose) by mouth daily. (Patient not taking: Reported on 12/04/2023), Disp: , Rfl:  .  Probiotic Product (PROBIOTIC DAILY PO), Take 1 tablet by mouth daily., Disp: , Rfl:  .  psyllium (TGT PSYLLIUM FIBER) 520 mg capsule, Take four capsules (2.08 g dose) by mouth 2 (two) times daily., Disp: , Rfl:  .  sodium chloride  (OCEAN) 0.65%  nasal spray, by Nasal route as needed., Disp: , Rfl:  .  TRELEGY ELLIPTA  200-62.5-25 MCG/ACT AEPB inhaler, Inhale one puff into the lungs daily., Disp: , Rfl:  .  vitamin B-12 (CYANOCOBALAMIN) 500 mcg tablet, Take one tablet (500 mcg dose) by mouth daily., Disp: , Rfl:   Interval History: Christine Moore returns in follow-up today for history of atrial fibrillation, multiple prior A-fib ablations, and most recently AV node ablation and dual-chamber pacemaker placement.  She had bundle lead placement of the ventricular pacing wire.  Her QRS looks pretty narrow with conduction engagement.  Echo done at the bedside today demonstrates subtle dyssynergy but overall normal ejection fraction.  She does have some LVH as well.  She has had an  excellent response to the AV node ablation and pacing.  She has little in the way of symptoms now.  ROS General - weight stable, appetite normal; no fever HEENT- vision stable, hearing normal; no difficulty swallowing Endocrine - thyroid stable Resp - denies cough, hemoptysis, purulent sputum Abdomen - denies belly pain, swelling; denies liver disease or jaundice GI - no hematemesis, melena. Normal bowel movements. No Crohns/UC history GU - no dysuria, hematuria, pyuria. No UTI or kidney stone. Neuro - no slurred speech, blurred vision; denies amaurosis or sided weakness Skin - no rash, bruising, petechia Hematologic - no recent bleeding or easy bruising Allergy - denies food allergy; drug allergies as noted Psych - No depression, normal sleep  Physical Examination Vitals:   12/04/23 0939  BP: 130/68  Pulse: 74  SpO2: 96%  Weight: 170 lb 9.6 oz (77.4 kg)  Height: 5' 9 (1.753 m)   General - Pt. in NAD, normal demeanor, speech normal, oriented normally HEENT - PERRLA, EOMs intact, nares and throat clear Neck - carotid upstrokes normal, bruits not present. Thyroid nonpalpable. No palpable nodes. Lungs - auscultation reveals clear breath sounds Heart -regular rhythm, normal S1, S2, no gallop, no murmur is present Abdomen - soft, bowel sounds positive, no organomegaly Extrem -no edema is noted, no cyanosis, clubbing is seen Skin - no rash, bruising or petechia Back - no CVA tenderness Neuro - strength is symmetric in upper and lower extremities; reflexes normal, speech normal, face symmetric Psych - affect appropriate  Post-visit Impression:  Paroxysmal atrial fibrillation History of multiple A-fib ablations Status post dual-chamber pacemaker with right ventricular bundle lead placement, AV node ablation Hyperlipidemia Eliquis  anticoagulation  Recommendations:  Return to clinic yearly, follow-up in pacemaker clinic in the interim

## 2024-01-21 ENCOUNTER — Ambulatory Visit (HOSPITAL_BASED_OUTPATIENT_CLINIC_OR_DEPARTMENT_OTHER): Payer: Medicare Other

## 2024-01-22 ENCOUNTER — Ambulatory Visit (HOSPITAL_BASED_OUTPATIENT_CLINIC_OR_DEPARTMENT_OTHER)
Admission: RE | Admit: 2024-01-22 | Discharge: 2024-01-22 | Disposition: A | Discharge status: Home / Self Care | Payer: Medicare Other | Source: Ambulatory Visit | Attending: Pulmonary Disease | Admitting: Pulmonary Disease

## 2024-01-22 DIAGNOSIS — J189 Pneumonia, unspecified organism: Secondary | ICD-10-CM | POA: Insufficient documentation

## 2024-01-28 ENCOUNTER — Ambulatory Visit (HOSPITAL_BASED_OUTPATIENT_CLINIC_OR_DEPARTMENT_OTHER): Payer: Medicare Other | Admitting: Pulmonary Disease

## 2024-01-28 ENCOUNTER — Encounter (HOSPITAL_BASED_OUTPATIENT_CLINIC_OR_DEPARTMENT_OTHER): Payer: Self-pay | Admitting: Pulmonary Disease

## 2024-01-28 ENCOUNTER — Ambulatory Visit (HOSPITAL_BASED_OUTPATIENT_CLINIC_OR_DEPARTMENT_OTHER): Payer: Medicare Other

## 2024-01-28 VITALS — BP 126/80 | HR 95 | Ht 69.0 in | Wt 176.2 lb

## 2024-01-28 DIAGNOSIS — J4489 Other specified chronic obstructive pulmonary disease: Secondary | ICD-10-CM | POA: Diagnosis not present

## 2024-01-28 NOTE — Patient Instructions (Signed)
 COPD-asthma overlap  --CONTINUE Trelegy 200-62.5-25 mcg ONE puff ONCE a day. --CONTINUE flutter valve twice a day AS NEEDED --CONTINUE Xopenex nebulizers twice a day followed by flutter valve. --CONTINUE singulair 10 mg daily --OK to take prednisone 20 mg ( 1 pill ) x 5 days for symptoms including worsening productive cough, shortness of breath and wheezing that has been present for 48 hours  Hx recurrent pneumonia Likely area high risk for recurrent pneumonia --Reviewed CT 01/22/24 with overall improved LLL infiltrate compared to 2022 --In the future if you develop s/sx concerning for pneumonia, will need two week course of antibiotics --OK to take prednisone 20 mg ( 1 pill ) x 5 days for symptoms including worsening productive cough, shortness of breath and wheezing that has been present for 48 hours

## 2024-01-28 NOTE — Progress Notes (Signed)
 Subjective:   PATIENT ID: Christine Moore GENDER: female DOB: August 12, 1946, MRN: [id number]   HPI  Chief Complaint  Patient presents with   Follow-up    Asthma, COPD   Reason for Visit: Follow-up COPD/asthma  [name] is a 78 year old female with asthma, atrial fibrillation s/p ablations and cardioversions, HTN, HLD and chronic headaches who presents for follow-up asthma/COPD management.  Synopsis: She was diagnosed with asthma as a teen and COPD in 2019. She is on Trelegy, xopenex and Singulair. She reports that she has had pneumonia four times from Aug 2021- Aug 2022. Has persistent productive cough that previously interrupted her ability to speak and be in public.   2022 Aug - Established care with  Pulm. Exacerbation in 10/2021 2023 Exacerbation in Jan and May and December 2024 - Persistent cough. Trialed PPI without change. Completed pulmonary rehab. COPD exacerbation in June and July. CTA 06/10/23 neg for PE and bilateral lower lobe consolidation. PM placed in July and treated with linezolid for SSI. Cough resolved completely after this antibiotic.  07/29/23 Since our last visit she had PM placed on 06/25/23 and was prescribed linezolid for possible skin infection after the procedure. Since cough completely resolved. She is compliant with Trelegy. No recent albuterol use but using nebulizer once a day. Denies shortness of breath or wheezing.   01/28/24 Still having an productive cough throughout the day with talking and activity. Less shortness of breath and using nebulizer less. Overall feeling better. Denies wheezing.   Asthma Control Test ACT Total Score  07/29/2023 10:28 AM 25  06/18/2023 10:14 AM 12  02/06/2022 10:08 AM 22   Social History: Former smoker. Smoked 1 ppd x 10 years. Quit in 1985. Retired Runner, broadcasting/film/video Significant second smoke exposure Mom had asthma Moving in with her sister  Past Medical History:  Diagnosis Date   Atrial fibrillation  (HCC)    Atrial flutter (HCC)    COPD (chronic obstructive pulmonary disease) (HCC)    Eczema    Hyperlipidemia    Hypothyroid    Urinary incontinence      Family History  Problem Relation Age of Onset   Melanoma Sister    Breast cancer Sister 95   Endometrial cancer Sister      Social History   Occupational History   Not on file  Tobacco Use   Smoking status: Former    Current packs/day: 0.00    Average packs/day: 1 pack/day for 22.0 years (22.0 ttl pk-yrs)    Types: Cigarettes    Start date: 1963    Quit date: 1985    Years since quitting: 40.1   Smokeless tobacco: Never  Vaping Use   Vaping status: Never Used  Substance and Sexual Activity   Alcohol use: Not Currently    Comment: drinks 1 drink per day of all the above   Drug use: Never   Sexual activity: Not Currently    Allergies  Allergen Reactions   Penicillins     Unknown reaction    Quinapril Swelling    Swelling of face    Latex Itching   Tessalon [Benzonatate] Swelling    Lip swelling. No throat swelling. Improved after stopping and taking benadryl   Formaldehyde Itching     Outpatient Medications Prior to Visit  Medication Sig Dispense Refill   acyclovir (ZOVIRAX) 400 MG tablet Take 400 mg by mouth 3 (three) times daily as needed (fever blisters).     apixaban (ELIQUIS) 5 MG TABS  tablet Take 1 tablet (5 mg total) by mouth 2 (two) times daily. Please notify new Cardiologist for refills. Thanks. 60 tablet 0   atorvastatin (LIPITOR) 10 MG tablet Take 10 mg by mouth at bedtime.     chlorpheniramine (CHLOR-TRIMETON) 4 MG tablet Take 4 mg by mouth See admin instructions. Take 4 mg twice daily, may increase to 4 times daily as needed for allergies     Cholecalciferol (VITAMIN D) 50 MCG (2000 UT) tablet Take 2,000 Units by mouth 2 (two) times daily.     clobetasol ointment (TEMOVATE) 0.05 % Apply 1 application  topically daily as needed (eczema).     diltiazem (CARDIZEM CD) 240 MG 24 hr capsule Take 1  capsule (240 mg total) by mouth daily. (Patient taking differently: Take 240 mg by mouth daily. Pt stated they increased her dose to 240mg  when cards D/Cd her Multaq) 90 capsule 3   Fluticasone-Umeclidin-Vilant (TRELEGY ELLIPTA) 200-62.5-25 MCG/INH AEPB Inhale 1 puff into the lungs daily.     hydrocortisone 2.5 % cream Apply 1 Application topically daily as needed (facial eczema).     Ketotifen Fumarate (ALAWAY OP) Place 1 drop into both eyes daily as needed (allergies).     levalbuterol (XOPENEX HFA) 45 MCG/ACT inhaler Inhale 2 puffs into the lungs 4 (four) times daily as needed for shortness of breath or wheezing. 1 each 5   levalbuterol (XOPENEX) 0.63 MG/3ML nebulizer solution Take 3 mLs (0.63 mg total) by nebulization every 6 (six) hours as needed for wheezing or shortness of breath. 360 mL 12   losartan (COZAAR) 50 MG tablet Take 1 tablet (50 mg total) by mouth 2 (two) times daily. 180 tablet 2   Lutein-Zeaxanthin 25-5 MG CAPS Take 1 capsule by mouth at bedtime.     montelukast (SINGULAIR) 10 MG tablet Take 10 mg by mouth at bedtime.     naproxen sodium (ALEVE) 220 MG tablet Take 220 mg by mouth daily as needed (headaches).     omeprazole (PRILOSEC OTC) 20 MG tablet Take 20 mg by mouth daily before breakfast.     OVER THE COUNTER MEDICATION Take 250 mg by mouth daily. Magnesium 250mg      OVER THE COUNTER MEDICATION Take 500 mcg by mouth daily. Vitamin B12     Probiotic Product (PROBIOTIC-10 PO) Take 1 tablet by mouth daily.     sodium chloride (OCEAN) 0.65 % SOLN nasal spray Place 1 spray into both nostrils as needed for congestion.     azithromycin (ZITHROMAX) 250 MG tablet Take 1 tablet (250 mg total) by mouth daily. Take first 2 tablets together, then 1 every day until finished. (Patient not taking: Reported on 01/28/2024) 6 tablet 0   levothyroxine (SYNTHROID) 50 MCG tablet Take 50 mcg by mouth daily.     No facility-administered medications prior to visit.    Review of Systems   Constitutional:  Negative for chills, diaphoresis, fever, malaise/fatigue and weight loss.  HENT:  Negative for congestion.   Respiratory:  Positive for cough and sputum production. Negative for hemoptysis, shortness of breath and wheezing.   Cardiovascular:  Negative for chest pain, palpitations and leg swelling.     Objective:   Vitals:   01/28/24 1022  BP: 126/80  Pulse: 95  SpO2: 97%  Weight: 176 lb 3.2 oz (79.9 kg)  Height: 5\' 9"  (1.753 m)   SpO2: 97 %  Physical Exam: General: Well-appearing, no acute distress HENT: [place], AT Eyes: EOMI, no scleral icterus Respiratory: Clear to auscultation bilaterally.  No crackles, wheezing or rales Cardiovascular: RRR, -M/R/G, no JVD Extremities:-Edema,-tenderness Neuro: AAO x4, CNII-XII grossly intact Psych: Normal mood, normal affect  Data Reviewed:  Imaging: CTA 05/08/21 - No pulmonary embolus, patchy bilateral lower lobe airspace consolidation left> right, 4 mm right upper lobe perifissural nodule CXR 12/24/2021-right perihilar pneumonia CTA 06/10/23 ([facility] report only) - neg for PE and bilateral lower lobe consolidation CT Chest 07/23/23 - Resolved bilateral lobe consolidation with atelectasis and bandlike scarring in LLL CT Chest 01/22/24 - Slightly improved infiltrates especially in the bases compared to 07/23/23 and significantly improved compared to CTA 05/08/21 in LLL infiltrate  PFT: 01/26/23 FVC 2.41 (72%) FEV1 1.53 (61%) Ratio 64  TLC 121% TV 178% RV/TLC 141% DLCO 101% Interpretation: Moderate obstructive defect with significant bronchodilator response in FVC. Hyperinflation and air trapping present.  Labs: Alpha 1-antitrypsin MZ - 91 (LLN 101) Immunoglobulins G/A/M - normal range Eosinophil 05/22/21 - 0    Assessment & Plan:   Discussion: 78 year old female former smoker with COPD-asthma overlap and recurrent exacerbations +/- pneumonia at least 2-3 times annually. Had 6 month period with no exacerbations from  12/2022-05/2023 until June/July 2024. Has been slow to resolve since then. Reviewed CT 01/2024 and compared to 2022 imaging, overall improved LLL infiltrate. Discussed clinical course and management of COPD/asthma including bronchodilator regimen, preventive care and action plan for exacerbation.   COPD-asthma overlap  --CONTINUE Trelegy 200-62.5-25 mcg ONE puff ONCE a day. --CONTINUE flutter valve twice a day AS NEEDED --CONTINUE Xopenex nebulizers twice a day followed by flutter valve. --CONTINUE singulair 10 mg daily --OK to take prednisone 20 mg ( 1 pill ) x 5 days for symptoms including worsening productive cough, shortness of breath and wheezing that has been present for 48 hours  Hx recurrent pneumonia Likely area high risk for recurrent pneumonia --Reviewed CT 01/22/24 with overall improved LLL infiltrate compared to 2022 --In the future if you develop s/sx concerning for pneumonia, will need two week course of antibiotics --OK to take prednisone 20 mg ( 1 pill ) x 5 days for symptoms including worsening productive cough, shortness of breath and wheezing that has been present for 48 hours --No bronchoscopy indicated unless symptoms recur/persistent and abnormal CT scan  **If needs CT need in the future, she has used alprazolam 1-2 mg PRN for anxiety**  Health Maintenance Immunization History  Administered Date(s) Administered   Influenza Inj Mdck Quad Pf 10/21/2013, 09/01/2019, 09/10/2020   Influenza Split 10/16/2004, 11/08/2009, 09/28/2013   Influenza, High Dose Seasonal PF 10/17/2016, 09/15/2017, 08/29/2021, 08/21/2022   Influenza, Seasonal, Injecte, Preservative Fre 09/23/2012   Influenza,inj,Quad PF,6+ Mos 09/18/2014, 09/21/2015   Influenza,inj,quad, With Preservative 08/31/2017, 10/07/2017, 09/07/2018, 09/02/2019   Influenza-Unspecified 10/21/2013   Moderna Sars-Covid-2 Vaccination 12/20/2019, 01/17/2020, 10/08/2020, 04/02/2021   PNEUMOCOCCAL CONJUGATE-20 02/19/2023   Pfizer  Covid-19 Vaccine Bivalent Booster 41yrs & up 09/29/2021   Pfizer(Comirnaty)Fall Seasonal Vaccine 12 years and older 12/04/2022   Pneumococcal Conjugate-13 05/28/2015, 05/27/2016   Pneumococcal Polysaccharide-23 12/21/2008, 12/01/2013, 05/31/2014   Tdap 04/03/2017, 02/19/2023   Zoster, Live 12/01/2013   CT Lung Screen - not qualified due to frequent CT  No orders of the defined types were placed in this encounter.  No orders of the defined types were placed in this encounter.  Return in about 8 months (around 09/26/2024).  I have spent a total time of 32-minutes on the day of the appointment including chart review, data review, collecting history, coordinating care and discussing medical diagnosis and plan with the  patient/family. Past medical history, allergies, medications were reviewed. Pertinent imaging, labs and tests included in this note have been reviewed and interpreted independently by me.  Suman Trivedi Mechele Collin, MD [facility] 01/28/2024 10:41 AM

## 2024-04-19 ENCOUNTER — Other Ambulatory Visit: Payer: Self-pay | Admitting: Family Medicine

## 2024-04-19 DIAGNOSIS — R1084 Generalized abdominal pain: Secondary | ICD-10-CM

## 2024-04-20 ENCOUNTER — Ambulatory Visit
Admission: RE | Admit: 2024-04-20 | Discharge: 2024-04-20 | Disposition: A | Discharge status: Home / Self Care | Source: Ambulatory Visit | Attending: Family Medicine | Admitting: Family Medicine

## 2024-04-20 DIAGNOSIS — R1084 Generalized abdominal pain: Secondary | ICD-10-CM

## 2024-05-04 ENCOUNTER — Telehealth (HOSPITAL_BASED_OUTPATIENT_CLINIC_OR_DEPARTMENT_OTHER): Payer: Self-pay

## 2024-05-04 NOTE — Telephone Encounter (Signed)
 Copied from CRM [id number]. Topic: Appointments - Appointment Scheduling >> May 03, 2024  2:44 PM [name] wrote: Patient/patient representative is calling to schedule an appointment. Refer to attachments for appointment information.  patient was diagnosed with pneumonia, she was given anti biotics but she states after she finished thme she still feels like she has pneumonia. Patient states her appointment is too far and need a sooner appointment, she also would like to speak with someone in regards to whether she needs to schedule her ct scan before her visit. Please call patient.

## 2024-05-05 NOTE — Telephone Encounter (Signed)
 Pt is going to be seen tomorrow at 9am with beth, Pt notified

## 2024-05-05 NOTE — Telephone Encounter (Signed)
 Who did she see for PNA ?  Has she followed up . Did she have imaging.  See if wants to be seen tomorrow or next week if opening .  Aaron AasPlease contact office for sooner follow up if symptoms do not improve or worsen or seek emergency care

## 2024-05-06 ENCOUNTER — Ambulatory Visit: Payer: Self-pay | Admitting: Primary Care

## 2024-05-06 ENCOUNTER — Ambulatory Visit (HOSPITAL_BASED_OUTPATIENT_CLINIC_OR_DEPARTMENT_OTHER)

## 2024-05-06 ENCOUNTER — Other Ambulatory Visit (HOSPITAL_BASED_OUTPATIENT_CLINIC_OR_DEPARTMENT_OTHER): Payer: Self-pay

## 2024-05-06 ENCOUNTER — Encounter (HOSPITAL_BASED_OUTPATIENT_CLINIC_OR_DEPARTMENT_OTHER): Payer: Self-pay | Admitting: Primary Care

## 2024-05-06 ENCOUNTER — Ambulatory Visit (HOSPITAL_BASED_OUTPATIENT_CLINIC_OR_DEPARTMENT_OTHER): Admitting: Primary Care

## 2024-05-06 VITALS — BP 131/61 | HR 87 | Ht 69.0 in | Wt 170.0 lb

## 2024-05-06 DIAGNOSIS — Z87891 Personal history of nicotine dependence: Secondary | ICD-10-CM | POA: Diagnosis not present

## 2024-05-06 DIAGNOSIS — J449 Chronic obstructive pulmonary disease, unspecified: Secondary | ICD-10-CM

## 2024-05-06 DIAGNOSIS — J189 Pneumonia, unspecified organism: Secondary | ICD-10-CM

## 2024-05-06 DIAGNOSIS — R918 Other nonspecific abnormal finding of lung field: Secondary | ICD-10-CM | POA: Diagnosis not present

## 2024-05-06 MED ORDER — DOXYCYCLINE HYCLATE 100 MG PO TABS: 100.0000 mg | ORAL_TABLET | Freq: Two times a day (BID) | ORAL | 0 refills | Status: DC

## 2024-05-06 MED ORDER — CEFPODOXIME PROXETIL 200 MG PO TABS: 200.0000 mg | ORAL_TABLET | Freq: Two times a day (BID) | ORAL | 0 refills | Status: DC

## 2024-05-06 NOTE — Patient Instructions (Addendum)
 -  PNEUMONIA: Pneumonia is an infection that inflames the air sacs in one or both lungs. Your CT scan indicated right lower lobe pneumonia versus atelectasis. We will order a chest x-ray today and consider extending your antibiotic course for an additional four days, making it a total of two weeks. You should take Mucinex  DM 1-2 tablets twice a day and use your Xopenex  nebulizer as needed, followed by the flutter valve for 5-10 breaths to help loosen chest congestion. Please note that symptoms could take 4-6 weeks to resolve. Follow up if your symptoms worsen.  -COUGH: Your cough developed around the time of your CT scan in May and initially improved with antibiotics but has since reoccurred. You do not have any episodes of choking, difficulty swallowing, or fevers. You have a loose upper airway congested cough. The plan for your cough is included in the pneumonia treatment plan.  INSTRUCTIONS:  Please get a chest x-ray today. Follow up if your symptoms worsen.

## 2024-05-06 NOTE — Progress Notes (Signed)
 Pt notified and CT ordered

## 2024-05-06 NOTE — Progress Notes (Signed)
 @Patient  ID: Christine Moore, female    DOB: 04-08-1946, 78 y.o.   MRN: [id number]  No chief complaint on file.   Referring provider: Lanae Pinal, MD  HPI: 78 year old female, former smoker. PMH significant for afib s/p ablations and cardioversion, elevated BP, HLD, asthma-COPD overlap, recurrent pneumonia, eczema.    Synopsis: She was diagnosed with asthma as a teen and COPD in 2019. She is on Trelegy, xopenex  and Singulair . She reports that she has had pneumonia four times from Aug 2021- Aug 2022. Has persistent productive cough that previously interrupted her ability to speak and be in public.    2022 Aug - Established care with [facility]. Exacerbation in 10/2021 2023 Exacerbation in Jan and May and December 2024 - Persistent cough. Trialed PPI without change. Completed pulmonary rehab. COPD exacerbation in June and July. CTA 06/10/23 neg for PE and bilateral lower lobe consolidation. PM placed in July and treated with linezolid for SSI. Cough resolved completely after this antibiotic.   07/29/23 Since our last visit she had PM placed on 06/25/23 and was prescribed linezolid for possible skin infection after the procedure. Since cough completely resolved. She is compliant with Trelegy. No recent albuterol  use but using nebulizer once a day. Denies shortness of breath or wheezing.    01/28/24 Still having an productive cough throughout the day with talking and activity. Less shortness of breath and using nebulizer less. Overall feeling better. Denies wheezing.    Discussion: 78 year old female former smoker with COPD-asthma overlap and recurrent exacerbations +/- pneumonia at least 2-3 times annually. Had 6 month period with no exacerbations from 12/2022-05/2023 until June/July 2024. Has been slow to resolve since then. Reviewed CT 01/2024 and compared to 2022 imaging, overall improved LLL infiltrate. Discussed clinical course and management of COPD/asthma including bronchodilator  regimen, preventive care and action plan for exacerbation.     COPD-asthma overlap  --CONTINUE Trelegy 200-62.5-25 mcg ONE puff ONCE a day. --CONTINUE flutter valve twice a day AS NEEDED --CONTINUE Xopenex  nebulizers twice a day followed by flutter valve. --CONTINUE singulair  10 mg daily --OK to take prednisone  20 mg ( 1 pill ) x 5 days for symptoms including worsening productive cough, shortness of breath and wheezing that has been present for 48 hours   Hx recurrent pneumonia Likely area high risk for recurrent pneumonia --Reviewed CT 01/22/24 with overall improved LLL infiltrate compared to 2022 --In the future if you develop s/sx concerning for pneumonia, will need two week course of antibiotics --OK to take prednisone  20 mg ( 1 pill ) x 5 days for symptoms including worsening productive cough, shortness of breath and wheezing that has been present for 48 hours --No bronchoscopy indicated unless symptoms recur/persistent and abnormal CT scan   **If needs CT need in the future, she has used alprazolam  1-2 mg PRN for anxiety**     05/06/2024  completed Doxycycline 100 mg 2 every day x 10 days, Cefpodoxime 200mg  2 every day for 10 days she finished both of these rxs on 05/02/2024.   She states she still has a lot of cough not as productive as before when first diagnosed.   Discussed the use of AI scribe software for clinical note transcription with the patient, who gave verbal consent to proceed.  History of Present Illness   Christine Moore is an 78 year old female who presents with recurrent cough and right lower lobe pneumonia versus atelectasis.  In May, she underwent a CT scan  due to stomach issues, which revealed right lower lobe pneumonia versus atelectasis. Around that time, she developed a cough that initially improved with 10 day course of Doxycyline and Cefpodoxime 200mg  which she completed on 6/2 but then reoccurred.   No significant associated respiratory symptoms such as  increased shortness of breath, chest tightness, or wheezing. No episodes of choking, dysphasia, or fevers.  She is compliant with Trelegy 200 mcg, taking 1 puff daily. She is not currently using her Xopenex  rescue inhaler or nebulizer and is not taking any medication specifically for the cough. She has a flutter valve at home.      Allergies  Allergen Reactions   Penicillins     Unknown reaction    Quinapril Swelling    Swelling of face    Latex Itching   Tessalon  [Benzonatate ] Swelling    Lip swelling. No throat swelling. Improved after stopping and taking benadryl   Formaldehyde Itching    Immunization History  Administered Date(s) Administered   Fluzone Influenza virus vaccine,trivalent (IIV3), split virus 09/28/2013   Influenza Inj Mdck Quad Pf 10/21/2013, 09/01/2019, 09/10/2020   Influenza Split 10/16/2004, 11/08/2009   Influenza, High Dose Seasonal PF 10/17/2016, 09/15/2017, 08/29/2021, 08/21/2022   Influenza, Seasonal, Injecte, Preservative Fre 09/23/2012   Influenza,inj,Quad PF,6+ Mos 09/18/2014, 09/21/2015   Influenza,inj,quad, With Preservative 08/31/2017, 10/07/2017, 09/07/2018, 09/02/2019   Influenza-Unspecified 10/21/2013   Moderna Sars-Covid-2 Vaccination 12/20/2019, 01/17/2020, 10/08/2020, 04/02/2021   PNEUMOCOCCAL CONJUGATE-20 02/19/2023   Pfizer Covid-19 Vaccine Bivalent Booster 31yrs & up 09/29/2021   Pfizer(Comirnaty)Fall Seasonal Vaccine 12 years and older 12/04/2022   Pneumococcal Conjugate-13 05/28/2015, 05/27/2016   Pneumococcal Polysaccharide-23 12/21/2008, 12/01/2013, 05/31/2014   Tdap 04/03/2017, 02/19/2023   Zoster, Live 12/01/2013    Past Medical History:  Diagnosis Date   Atrial fibrillation (HCC)    Atrial flutter (HCC)    COPD (chronic obstructive pulmonary disease) (HCC)    Eczema    Hyperlipidemia    Hypothyroid    Urinary incontinence     Tobacco History: Social History   Tobacco Use  Smoking Status Former   Current packs/day:  0.00   Average packs/day: 1 pack/day for 22.0 years (22.0 ttl pk-yrs)   Types: Cigarettes   Start date: 1963   Quit date: 1985   Years since quitting: 40.4  Smokeless Tobacco Never   Counseling given: Not Answered   Outpatient Medications Prior to Visit  Medication Sig Dispense Refill   acyclovir (ZOVIRAX) 400 MG tablet Take 400 mg by mouth 3 (three) times daily as needed (fever blisters).     apixaban  (ELIQUIS ) 5 MG TABS tablet Take 1 tablet (5 mg total) by mouth 2 (two) times daily. Please notify new Cardiologist for refills. Thanks. 60 tablet 0   atorvastatin  (LIPITOR) 10 MG tablet Take 10 mg by mouth at bedtime.     azithromycin  (ZITHROMAX ) 250 MG tablet Take 1 tablet (250 mg total) by mouth daily. Take first 2 tablets together, then 1 every day until finished. (Patient not taking: Reported on 01/28/2024) 6 tablet 0   cefpodoxime (VANTIN) 200 MG tablet 1 tablet with food Orally every 12 hrs for 10 days     chlorpheniramine (CHLOR-TRIMETON) 4 MG tablet Take 4 mg by mouth See admin instructions. Take 4 mg twice daily, may increase to 4 times daily as needed for allergies     Cholecalciferol (VITAMIN D) 50 MCG (2000 UT) tablet Take 2,000 Units by mouth 2 (two) times daily.     clobetasol ointment (TEMOVATE) 0.05 % Apply  1 application  topically daily as needed (eczema).     diltiazem  (CARDIZEM  CD) 240 MG 24 hr capsule Take 1 capsule (240 mg total) by mouth daily. (Patient taking differently: Take 240 mg by mouth daily. Pt stated they increased her dose to 240mg  when cards D/Cd her Multaq) 90 capsule 3   doxycycline (VIBRA-TABS) 100 MG tablet 1 tablet Orally twice a day for 10 day(s)     Fluticasone -Umeclidin-Vilant (TRELEGY ELLIPTA) 200-62.5-25 MCG/INH AEPB Inhale 1 puff into the lungs daily.     hydrocortisone 2.5 % cream Apply 1 Application topically daily as needed (facial eczema).     Ketotifen Fumarate (ALAWAY OP) Place 1 drop into both eyes daily as needed (allergies).      levalbuterol  (XOPENEX  HFA) 45 MCG/ACT inhaler Inhale 2 puffs into the lungs 4 (four) times daily as needed for shortness of breath or wheezing. 1 each 5   levalbuterol  (XOPENEX ) 0.63 MG/3ML nebulizer solution Take 3 mLs (0.63 mg total) by nebulization every 6 (six) hours as needed for wheezing or shortness of breath. 360 mL 12   levothyroxine  (SYNTHROID ) 50 MCG tablet Take 50 mcg by mouth daily.     losartan  (COZAAR ) 50 MG tablet Take 1 tablet (50 mg total) by mouth 2 (two) times daily. 180 tablet 2   Lutein-Zeaxanthin 25-5 MG CAPS Take 1 capsule by mouth at bedtime.     montelukast  (SINGULAIR ) 10 MG tablet Take 10 mg by mouth at bedtime.     naproxen sodium (ALEVE) 220 MG tablet Take 220 mg by mouth daily as needed (headaches).     omeprazole (PRILOSEC OTC) 20 MG tablet Take 20 mg by mouth daily before breakfast.     OVER THE COUNTER MEDICATION Take 250 mg by mouth daily. Magnesium 250mg      OVER THE COUNTER MEDICATION Take 500 mcg by mouth daily. Vitamin B12     Probiotic Product (PROBIOTIC-10 PO) Take 1 tablet by mouth daily.     sodium chloride  (OCEAN) 0.65 % SOLN nasal spray Place 1 spray into both nostrils as needed for congestion.     No facility-administered medications prior to visit.      Review of Systems  Review of Systems  Constitutional:  Negative for diaphoresis, fatigue and fever.  HENT:  Positive for congestion.   Respiratory:  Positive for cough. Negative for shortness of breath and wheezing.   Cardiovascular: Negative.      Physical Exam  There were no vitals taken for this visit. Physical Exam Constitutional:      Appearance: Normal appearance.  HENT:     Head: Normocephalic and atraumatic.     Mouth/Throat:     Mouth: Mucous membranes are moist.     Pharynx: Oropharynx is clear.  Cardiovascular:     Rate and Rhythm: Normal rate and regular rhythm.  Pulmonary:     Effort: Pulmonary effort is normal.     Breath sounds: No wheezing, rhonchi or rales.      Comments: CTA; Congested upper airway cough Musculoskeletal:        General: Normal range of motion.     Cervical back: Normal range of motion and neck supple.  Skin:    General: Skin is warm and dry.  Neurological:     General: No focal deficit present.     Mental Status: She is alert and oriented to person, place, and time. Mental status is at baseline.  Psychiatric:        Mood and Affect: Mood normal.  Behavior: Behavior normal.        Thought Content: Thought content normal.        Judgment: Judgment normal.      Lab Results:  CBC    Component Value Date/Time   WBC 17.4 (H) 10/23/2023 1149   RBC 4.26 10/23/2023 1149   HGB 12.3 10/23/2023 1149   HCT 37.1 10/23/2023 1149   PLT 347 10/23/2023 1149   MCV 87.1 10/23/2023 1149   MCH 28.9 10/23/2023 1149   MCHC 33.2 10/23/2023 1149   RDW 13.3 10/23/2023 1149   LYMPHSABS 2.2 05/09/2021 0107   MONOABS 1.0 05/09/2021 0107   EOSABS 0.0 05/09/2021 0107   BASOSABS 0.0 05/09/2021 0107    BMET    Component Value Date/Time   NA 135 10/23/2023 1149   K 3.8 10/23/2023 1149   CL 104 10/23/2023 1149   CO2 23 10/23/2023 1149   GLUCOSE 102 (H) 10/23/2023 1149   BUN 14 10/23/2023 1149   CREATININE 0.54 10/23/2023 1149   CALCIUM  9.3 10/23/2023 1149   GFRNONAA >60 10/23/2023 1149    BNP    Component Value Date/Time   BNP 106.1 (H) 05/08/2021 0636    ProBNP No results found for: "PROBNP"  Imaging: CT ABDOMEN PELVIS WO CONTRAST Result Date: 04/20/2024 CLINICAL DATA:  Generalized abdominal pain for 3 weeks. EXAM: CT ABDOMEN AND PELVIS WITHOUT CONTRAST TECHNIQUE: Multidetector CT imaging of the abdomen and pelvis was performed following the standard protocol without IV contrast. RADIATION DOSE REDUCTION: This exam was performed according to the departmental dose-optimization program which includes automated exposure control, adjustment of the mA and/or kV according to patient size and/or use of iterative reconstruction  technique. COMPARISON:  None Available. FINDINGS: Lower chest: Mild right lower lobe pneumonia or atelectasis is noted. Hepatobiliary: No focal liver abnormality is seen. No gallstones, gallbladder wall thickening, or biliary dilatation. Pancreas: Unremarkable. No pancreatic ductal dilatation or surrounding inflammatory changes. Spleen: Normal in size without focal abnormality. Adrenals/Urinary Tract: Adrenal glands appear normal. 11 mm exophytic rounded abnormality is seen arising posteriorly from left kidney. No hydronephrosis or renal obstruction is noted. Urinary bladder is unremarkable. Stomach/Bowel: Stomach is within normal limits. Appendix appears normal. No evidence of bowel wall thickening, distention, or inflammatory changes. Vascular/Lymphatic: Aortic atherosclerosis. No enlarged abdominal or pelvic lymph nodes. Reproductive: Status post hysterectomy. No adnexal masses. Other: No ascites or hernia. Musculoskeletal: No acute or significant osseous findings. IMPRESSION: Mild right lower lobe pneumonia or atelectasis is noted. 11 mm exophytic rounded abnormality seen arising posteriorly from left kidney. Renal ultrasound is recommended evaluate for cyst or mass. Aortic Atherosclerosis (ICD10-I70.0). Electronically Signed   By: Rosalene Colon M.D.   On: 04/20/2024 16:26     Assessment & Plan:   1. Community acquired pneumonia, unspecified laterality (Primary) - DG Chest 2 View; Future  Assessment and Plan    Pneumonia CT scan in May indicated right lower lobe pneumonia versus atelectasis. There does appear to be a focal area of consolidation to right lower lobe when I reviewed imaging. She was prescribed Doxycycline 100mg  BID and Cefpodoxime 200mg  2 every day for 10 days. Cough initially improved with antibiotics but reoccurred. No significant associated respiratory symptoms. Lungs clear on examination. Loose/congested upper airway cough present. No need for admission as she appears well and  examination is benign. - Order chest x-ray today - Consider extending antibiotic course for an additional four days for a total of two weeks - Advise to take Mucinex  DM 600-1200mg  twice daily -  Use Xopenex  nebulizer as needed followed by flutter valve for 5-10 breaths to loosen chest congestion - Advise that symptoms could take 4-6 weeks to resolve - Follow up if symptoms worsen  COPD Not currently exacerbated - Continue Trelegy 200mcg one puff daily   Antonio Baumgarten, NP 05/06/2024

## 2024-05-06 NOTE — Addendum Note (Signed)
 Addended by: Antonio Baumgarten on: 05/06/2024 02:13 PM   Modules accepted: Orders

## 2024-05-27 ENCOUNTER — Emergency Department (HOSPITAL_BASED_OUTPATIENT_CLINIC_OR_DEPARTMENT_OTHER)

## 2024-05-27 ENCOUNTER — Other Ambulatory Visit: Payer: Self-pay

## 2024-05-27 ENCOUNTER — Encounter (HOSPITAL_BASED_OUTPATIENT_CLINIC_OR_DEPARTMENT_OTHER): Payer: Self-pay

## 2024-05-27 ENCOUNTER — Other Ambulatory Visit (HOSPITAL_BASED_OUTPATIENT_CLINIC_OR_DEPARTMENT_OTHER): Payer: Self-pay

## 2024-05-27 ENCOUNTER — Emergency Department (HOSPITAL_BASED_OUTPATIENT_CLINIC_OR_DEPARTMENT_OTHER)
Admission: EM | Admit: 2024-05-27 | Discharge: 2024-05-27 | Disposition: A | Discharge status: Home / Self Care | Attending: Emergency Medicine | Admitting: Emergency Medicine

## 2024-05-27 DIAGNOSIS — J449 Chronic obstructive pulmonary disease, unspecified: Secondary | ICD-10-CM | POA: Insufficient documentation

## 2024-05-27 DIAGNOSIS — Z87891 Personal history of nicotine dependence: Secondary | ICD-10-CM | POA: Diagnosis not present

## 2024-05-27 DIAGNOSIS — N289 Disorder of kidney and ureter, unspecified: Secondary | ICD-10-CM | POA: Insufficient documentation

## 2024-05-27 DIAGNOSIS — Z79899 Other long term (current) drug therapy: Secondary | ICD-10-CM | POA: Insufficient documentation

## 2024-05-27 DIAGNOSIS — R1030 Lower abdominal pain, unspecified: Secondary | ICD-10-CM

## 2024-05-27 DIAGNOSIS — K5900 Constipation, unspecified: Secondary | ICD-10-CM | POA: Diagnosis not present

## 2024-05-27 DIAGNOSIS — Z7901 Long term (current) use of anticoagulants: Secondary | ICD-10-CM | POA: Insufficient documentation

## 2024-05-27 DIAGNOSIS — Z7951 Long term (current) use of inhaled steroids: Secondary | ICD-10-CM | POA: Diagnosis not present

## 2024-05-27 DIAGNOSIS — N3000 Acute cystitis without hematuria: Secondary | ICD-10-CM | POA: Diagnosis not present

## 2024-05-27 DIAGNOSIS — E039 Hypothyroidism, unspecified: Secondary | ICD-10-CM | POA: Insufficient documentation

## 2024-05-27 DIAGNOSIS — Z91040 Latex allergy status: Secondary | ICD-10-CM | POA: Insufficient documentation

## 2024-05-27 LAB — URINALYSIS, ROUTINE W REFLEX MICROSCOPIC
Bilirubin Urine: NEGATIVE
Glucose, UA: NEGATIVE mg/dL
Ketones, ur: NEGATIVE mg/dL
Nitrite: NEGATIVE
Protein, ur: 30 mg/dL — AB
RBC / HPF: >50 RBC/hpf (ref 0–5)
Renal Epithelial: 1
Specific Gravity, Urine: 1.007 (ref 1.005–1.030)
Trans Epithel, UA: 1
WBC, UA: >50 WBC/hpf (ref 0–5)
pH: 6 (ref 5.0–8.0)

## 2024-05-27 LAB — CBC
HCT: 40.9 % (ref 36.0–46.0)
Hemoglobin: 13.2 g/dL (ref 12.0–15.0)
MCH: 28.2 pg (ref 26.0–34.0)
MCHC: 32.3 g/dL (ref 30.0–36.0)
MCV: 87.4 fL (ref 80.0–100.0)
Platelets: 333 10*3/uL (ref 150–400)
RBC: 4.68 MIL/uL (ref 3.87–5.11)
RDW: 15.5 % (ref 11.5–15.5)
WBC: 16.5 10*3/uL — ABNORMAL HIGH (ref 4.0–10.5)
nRBC: 0 % (ref 0.0–0.2)

## 2024-05-27 LAB — COMPREHENSIVE METABOLIC PANEL WITH GFR
ALT: 12 U/L (ref 0–44)
AST: 21 U/L (ref 15–41)
Albumin: 4.1 g/dL (ref 3.5–5.0)
Alkaline Phosphatase: 85 U/L (ref 38–126)
Anion gap: 14 (ref 5–15)
BUN: 14 mg/dL (ref 8–23)
CO2: 20 mmol/L — ABNORMAL LOW (ref 22–32)
Calcium: 10.2 mg/dL (ref 8.9–10.3)
Chloride: 104 mmol/L (ref 98–111)
Creatinine, Ser: 0.83 mg/dL (ref 0.44–1.00)
GFR, Estimated: >60 mL/min (ref >60)
Glucose, Bld: 99 mg/dL (ref 70–99)
Potassium: 4 mmol/L (ref 3.5–5.1)
Sodium: 138 mmol/L (ref 135–145)
Total Bilirubin: 0.9 mg/dL (ref 0.0–1.2)
Total Protein: 7.7 g/dL (ref 6.5–8.1)

## 2024-05-27 LAB — LIPASE, BLOOD: Lipase: 25 U/L (ref 11–51)

## 2024-05-27 MED ORDER — CEPHALEXIN 500 MG PO CAPS
500.0000 mg | ORAL_CAPSULE | Freq: Four times a day (QID) | ORAL | 0 refills | Status: AC
  Filled 2024-05-27: qty 28, 7d supply, fill #0

## 2024-05-27 MED ORDER — IOHEXOL 300 MG/ML  SOLN
100.0000 mL | Freq: Once | INTRAMUSCULAR | Status: AC | PRN
  Administered 2024-05-27: 100 mL via INTRAVENOUS

## 2024-05-27 MED ORDER — SODIUM CHLORIDE 0.9 % IV SOLN
1.0000 g | Freq: Once | INTRAVENOUS | Status: AC
  Administered 2024-05-27: 1 g via INTRAVENOUS
  Filled 2024-05-27: qty 10

## 2024-05-27 MED ORDER — POLYETHYLENE GLYCOL 3350 17 G PO PACK
17.0000 g | PACK | Freq: Every day | ORAL | 2 refills | Status: AC
  Filled 2024-05-27: qty 14, 14d supply, fill #0

## 2024-05-27 NOTE — ED Provider Notes (Addendum)
 [facility] EMERGENCY DEPARTMENT AT [facility] Provider Note   CSN: [id number] Arrival date & time: 05/27/24  1339     Patient presents with: Rectal Bleeding and Abdominal Pain   Christine Moore is a 78 y.o. female.   Patient status post colonoscopy on June 18.  Done by [facility].  Findings from that had a polyp some internal hemorrhoids and diverticulosis.  But no other acute abnormalities.  Patient claims that her bowel movements are blocked by an internal hemorrhoid however she has no past knowledge of that ever occurring before.  She says she have difficulty stooling difficulty urinating no nausea no vomiting.  Past medical history of her overactive bladder and recently had Botox shots in the bladder.  Vital signs here temp 98.1 pulse 77 respirations 18 blood pressure 131/76 oxygen saturation 99% on room air patient has a pacemaker.  For atrial fibs is on Eliquis .  Past medical history significant for COPD atrial flutter atrial fibrillation hypothyroid hyperlipidemia urinary incontinence.  Past surgical history significant for abdominal hysterectomy patient is a former smoker quit in 1985.  Patient states that 1 time when she wiped there was little bit of blood.  There is been no blood that has been seeping out into the toilet water.  No significant GI bleed.       Prior to Admission medications   Medication Sig Start Date End Date Taking? Authorizing Provider  acyclovir (ZOVIRAX) 400 MG tablet Take 400 mg by mouth 3 (three) times daily as needed (fever blisters). 09/19/19   [provider]  apixaban  (ELIQUIS ) 5 MG TABS tablet Take 1 tablet (5 mg total) by mouth 2 (two) times daily. Please notify new Cardiologist for refills. Thanks. 05/13/23   Cindie Ole DASEN, MD  atorvastatin  (LIPITOR) 10 MG tablet Take 10 mg by mouth at bedtime. 08/30/20   [provider]  cefpodoxime  (VANTIN ) 200 MG tablet Take 1 tablet (200 mg total) by mouth 2 (two) times daily. 05/06/24    Hope Almarie ORN, NP  chlorpheniramine (CHLOR-TRIMETON) 4 MG tablet Take 4 mg by mouth See admin instructions. Take 4 mg twice daily, may increase to 4 times daily as needed for allergies    [provider]  Cholecalciferol (VITAMIN D) 50 MCG (2000 UT) tablet Take 2,000 Units by mouth 2 (two) times daily.    [provider]  clobetasol ointment (TEMOVATE) 0.05 % Apply 1 application  topically daily as needed (eczema).    [provider]  diltiazem  (CARDIZEM  CD) 240 MG 24 hr capsule Take 1 capsule (240 mg total) by mouth daily. Patient taking differently: Take 240 mg by mouth daily. Pt stated they increased her dose to 240mg  when cards D/Cd her Multaq 02/04/23   Cindie Ole DASEN, MD  doxycycline  (VIBRA -TABS) 100 MG tablet Take 1 tablet (100 mg total) by mouth 2 (two) times daily. 05/06/24   Hope Almarie ORN, NP  Fluticasone -Umeclidin-Vilant (TRELEGY ELLIPTA) 200-62.5-25 MCG/INH AEPB Inhale 1 puff into the lungs daily. 04/02/20   [provider]  hydrocortisone 2.5 % cream Apply 1 Application topically daily as needed (facial eczema).    [provider]  Ketotifen Fumarate (ALAWAY OP) Place 1 drop into both eyes daily as needed (allergies).    [provider]  levalbuterol  (XOPENEX  HFA) 45 MCG/ACT inhaler Inhale 2 puffs into the lungs 4 (four) times daily as needed for shortness of breath or wheezing. 01/22/23   Kassie Acquanetta Bradley, MD  levalbuterol  (XOPENEX ) 0.63 MG/3ML nebulizer solution Take  3 mLs (0.63 mg total) by nebulization every 6 (six) hours as needed for wheezing or shortness of breath. 05/08/23   Kassie Acquanetta Bradley, MD  levothyroxine  (SYNTHROID ) 50 MCG tablet Take 50 mcg by mouth daily. 10/12/20 10/15/22  [provider]  losartan  (COZAAR ) 50 MG tablet Take 1 tablet (50 mg total) by mouth 2 (two) times daily. 10/15/22   Dow Arland BROCKS, NP  Lutein-Zeaxanthin 25-5 MG CAPS Take 1 capsule by mouth at bedtime.    [provider]   montelukast  (SINGULAIR ) 10 MG tablet Take 10 mg by mouth at bedtime. 05/09/19   [provider]  naproxen sodium (ALEVE) 220 MG tablet Take 220 mg by mouth daily as needed (headaches).    [provider]  omeprazole (PRILOSEC OTC) 20 MG tablet Take 20 mg by mouth daily before breakfast.    [provider]  OVER THE COUNTER MEDICATION Take 250 mg by mouth daily. Magnesium 250mg     [provider]  OVER THE COUNTER MEDICATION Take 500 mcg by mouth daily. Vitamin B12    [provider]  Probiotic Product (PROBIOTIC-10 PO) Take 1 tablet by mouth daily.    [provider]  sertraline (ZOLOFT) 25 MG tablet Take 25 mg by mouth daily.    [provider]  sodium chloride  (OCEAN) 0.65 % SOLN nasal spray Place 1 spray into both nostrils as needed for congestion.    [provider]    Allergies: Penicillins, Quinapril, Latex, Tessalon  [benzonatate ], and Formaldehyde    Review of Systems  Constitutional:  Negative for chills and fever.  HENT:  Negative for ear pain and sore throat.   Eyes:  Negative for pain and visual disturbance.  Respiratory:  Negative for cough and shortness of breath.   Cardiovascular:  Negative for chest pain and palpitations.  Gastrointestinal:  Positive for anal bleeding and constipation. Negative for abdominal pain, blood in stool, diarrhea, nausea, rectal pain and vomiting.  Genitourinary:  Positive for difficulty urinating and dysuria. Negative for hematuria.  Musculoskeletal:  Negative for arthralgias and back pain.  Skin:  Negative for color change and rash.  Neurological:  Negative for seizures and syncope.  All other systems reviewed and are negative.   Updated Vital Signs BP (!) 151/76 (BP Location: Right Arm)   Pulse 77   Temp 98.1 F (36.7 C)   Resp 18   Ht 1.753 m (5' 9)   Wt 77.1 kg   SpO2 99%   BMI 25.10 kg/m   Physical Exam Vitals and nursing note reviewed.  Constitutional:       General: She is not in acute distress.    Appearance: Normal appearance. She is well-developed.  HENT:     Head: Normocephalic and atraumatic.     Mouth/Throat:     Mouth: Mucous membranes are moist.   Eyes:     Conjunctiva/sclera: Conjunctivae normal.    Cardiovascular:     Rate and Rhythm: Normal rate and regular rhythm.     Heart sounds: No murmur heard. Pulmonary:     Effort: Pulmonary effort is normal. No respiratory distress.     Breath sounds: Normal breath sounds.  Abdominal:     General: There is no distension.     Palpations: Abdomen is soft.     Tenderness: There is no abdominal tenderness. There is no guarding.   Musculoskeletal:        General: No swelling.     Cervical back: Neck supple.  Right lower leg: No edema.     Left lower leg: No edema.   Skin:    General: Skin is warm and dry.     Capillary Refill: Capillary refill takes less than 2 seconds.   Neurological:     General: No focal deficit present.     Mental Status: She is alert and oriented to person, place, and time.   Psychiatric:        Mood and Affect: Mood normal.     (all labs ordered are listed, but only abnormal results are displayed) Labs Reviewed  COMPREHENSIVE METABOLIC PANEL WITH GFR - Abnormal; Notable for the following components:      Result Value   CO2 20 (*)    All other components within normal limits  CBC - Abnormal; Notable for the following components:   WBC 16.5 (*)    All other components within normal limits  URINALYSIS, ROUTINE W REFLEX MICROSCOPIC - Abnormal; Notable for the following components:   APPearance HAZY (*)    Hgb urine dipstick LARGE (*)    Protein, ur 30 (*)    Leukocytes,Ua LARGE (*)    Bacteria, UA RARE (*)    All other components within normal limits  URINE CULTURE  LIPASE, BLOOD    EKG: EKG Interpretation Date/Time:  Friday May 27 2024 15:35:27 EDT Ventricular Rate:  70 PR Interval:    QRS Duration:  135 QT Interval:  480 QTC  Calculation: 518 R Axis:   -26  Text Interpretation: Afib/flut and V-paced complexes No further analysis attempted due to paced rhythm Confirmed by Deauna Yaw [phone]) on 05/27/2024 3:40:11 PM  Radiology: CT ABDOMEN PELVIS W CONTRAST Result Date: 05/27/2024 CLINICAL DATA:  Left lower quadrant abdominal pain.  Dysuria. EXAM: CT ABDOMEN AND PELVIS WITH CONTRAST TECHNIQUE: Multidetector CT imaging of the abdomen and pelvis was performed using the standard protocol following bolus administration of intravenous contrast. RADIATION DOSE REDUCTION: This exam was performed according to the departmental dose-optimization program which includes automated exposure control, adjustment of the mA and/or kV according to patient size and/or use of iterative reconstruction technique. CONTRAST:  OMNIPAQUE  IOHEXOL  300 MG/ML  SOLN COMPARISON:  CT 04/20/2024 FINDINGS: Lower chest: The heart is enlarged. Pacemaker partially included. Scarring within bilateral lower lobes. Hepatobiliary: There is contrast refluxing into the hepatic veins and IVC. No focal liver abnormality. Gallbladder physiologically distended, no calcified stone. No biliary dilatation. Pancreas: Mild parenchymal atrophy. No ductal dilatation or inflammation. Spleen: Normal in size without focal abnormality. Adrenals/Urinary Tract: Normal adrenal glands. No hydronephrosis or renal inflammation. No perinephric fat stranding. Small bilateral low-density renal lesions are unchanged from CT last month. This includes an 11 mm posterior exophytic lesion from the left kidney series 2, image 28 with Hounsfield units of 22 and 10 mm water density lesion in the upper right kidney, series 2, image 23. There is marked bladder wall thickening with mild bladder wall enhancement and mild perivesicular fat stranding. Stomach/Bowel: Nondistended stomach. No small bowel obstruction or inflammation. There is fecalization of distal small bowel contents. Small to moderate  volume of stool in the colon. No colonic wall thickening or pericolonic edema. Normal appendix is visualized. Occasional sigmoid colonic diverticula without diverticulitis. Vascular/Lymphatic: Aortic and branch atherosclerosis. No aortic aneurysm. No abdominopelvic adenopathy. Reproductive: Status post hysterectomy. No adnexal masses. Other: No free air, free fluid, or intra-abdominal fluid collection. Minimal fat in the left inguinal canal. Musculoskeletal: There are no acute or suspicious osseous abnormalities. Degenerative change in the  spine. IMPRESSION: 1. Marked bladder wall thickening with mild bladder wall enhancement and mild perivesicular fat stranding, suspicious for cystitis. Recommend correlation with urinalysis. 2. Small bilateral low-density renal lesions are unchanged from CT last month. These are incompletely characterized on the current exam. Initial evaluation with elective ultrasound is recommended, although renal protocol MRI may be needed for further characterization. 3. Fecalization of distal small bowel contents, can be seen with slow transit. 4. Colonic diverticulosis without diverticulitis. 5. Cardiomegaly with contrast refluxing into the hepatic veins and IVC, suggesting right heart dysfunction. Aortic Atherosclerosis (ICD10-I70.0). Electronically Signed   By: Andrea Gasman M.D.   On: 05/27/2024 16:19     Procedures   Medications Ordered in the ED  cefTRIAXone  (ROCEPHIN ) 1 g in sodium chloride  0.9 % 100 mL IVPB (has no administration in time range)  iohexol  (OMNIPAQUE ) 300 MG/ML solution 100 mL (100 mLs Intravenous Contrast Given 05/27/24 1547)                                    Medical Decision Making Amount and/or Complexity of Data Reviewed Labs: ordered. Radiology: ordered.  Risk OTC drugs. Prescription drug management.   Patient is abdomen soft nontender.  Patient's complete metabolic panel normal other than CO2 20 lipase normal.  Urinalysis pending.  CBC  white count 16.5 hemoglobin stable at 13.2 for her.  Platelets are 333.  Based on her complaints difficult to sort out what is going on without doing CT scan abdomen and pelvis.  Have ordered that.  Since she has a pacemaker we will get EKG.  Based on history no significant GI bleed and hemoglobin appears quite stable.  Urinalysis is does have greater than 50 RBCs and greater than 50 white blood cells.  But bacteria is rare.  Will send for culture.  Also nitrite is negative.  But suggest probable interstitial cystitis or UTI.  EKG is consistent with ventricular paced complexes which is appropriate for pacemaker.  CT scan pending.  CT scan showed bilateral renal lesions could be further evaluated by ultrasound will ask patient if he wants to have that done today.  There is evidence of cystitis which correlates with her urinalysis even though not a lot of bacteria there.  Some evidence of small to moderate amount of stool burden.  But no evidence of any blockages or any other acute findings inside the abdomen.  Based on all this we will probably treat her with MiraLAX would recommend treatment for the cystitis even though could be interstitial cystitis urine culture has been sent and will help confirm whether there is a bacterial infection.  If she does not want to do the renal ultrasound today.  She can follow-up with her primary care doctor to have it done as an outpatient.  Offered rectal exam because patient feels as if something is stuck in the rectal area.  Based on CT scan.  Nothing acute or any significant blockage.  She declined to have the rectal exam.  Also got information the patient had been on antibiotics for recurrent pneumonia and stopped that about a week ago.  Is possible it could have changed her gut flora.  Recommend treatment with Keflex  for the urinary tract infection which may be interstitial cystitis follow-up with urology for that.  Urine culture pending.  I recommend  follow-up with her gastroenterologist regarding the constipation which is not severe.  Will recommend MiraLAX twice a  day.  Final diagnoses:  Lower abdominal pain  Acute cystitis without hematuria  Renal lesion  Constipation, unspecified constipation type    ED Discharge Orders     None          Geraldene Hamilton, MD 05/27/24 1530    Geraldene Hamilton, MD 05/27/24 [id number]    Geraldene Hamilton, MD 05/27/24 [phone]

## 2024-05-27 NOTE — ED Triage Notes (Signed)
 In for eval of possible internal hemorrhoid. Reports difficulty stooling and difficulty urinating due to the blockage. Reports she started urinating today with dysuria. PMH overactive bladder and recently had botox shots in bladder.

## 2024-05-27 NOTE — Discharge Instructions (Signed)
 Take the antibiotic Keflex  as directed.  Your urine has been sent for culture.  Make an appointment follow-up with urologist.  Would also consider make an appointment follow-up with your gastroenterologist.  I also recommend that you do MiraLAX twice a day and to start having pretty good bowel movements then you can drop down to once a day.  Prescription sent in for Keflex  and MiraLAX.  Return for any new or worse symptoms.

## 2024-05-29 LAB — URINE CULTURE: Culture: 60000 — AB

## 2024-05-30 ENCOUNTER — Telehealth (HOSPITAL_BASED_OUTPATIENT_CLINIC_OR_DEPARTMENT_OTHER): Payer: Self-pay | Admitting: *Deleted

## 2024-05-30 NOTE — Telephone Encounter (Signed)
 Post ED Visit - Positive Culture Follow-up  Culture report reviewed by antimicrobial stewardship pharmacist: [facility] [x]  Leonor Bash, [place].D. []  Venetia Gully, [address].D., BCPS AQ-ID []  Garrel Crews, Pharm.D., BCPS []  Almarie Lunger, Pharm.D., BCPS []  [place], [place].D., BCPS, AAHIVP []  Rosaline Bihari, Pharm.D., BCPS, AAHIVP []  Vernell Meier, PharmD, BCPS []  Latanya Hint, PharmD, BCPS []  Donald Medley, PharmD, BCPS []  Rocky Bold, PharmD []  Dorothyann Alert, PharmD, BCPS []  Morene Babe, PharmD  [facility] []  Rosaline Edison, PharmD []  Romona Bliss, PharmD []  Dolphus Roller, PharmD []  Veva Seip, Rph []  Vernell Daunt) Leonce, PharmD []  Eva Allis, PharmD []  Rosaline Millet, PharmD []  Iantha Batch, PharmD []  Arvin Gauss, PharmD []  Wanda Hasting, PharmD []  Ronal Rav, PharmD []  Rocky Slade, PharmD []  Bard Jeans, PharmD   Positive urine culture Treated with cephalexin , organism sensitive to the same and no further patient follow-up is required at this time.  [name] 05/30/2024, 3:20 PM

## 2024-06-06 ENCOUNTER — Ambulatory Visit (HOSPITAL_BASED_OUTPATIENT_CLINIC_OR_DEPARTMENT_OTHER): Attending: Primary Care

## 2024-06-20 ENCOUNTER — Encounter (HOSPITAL_BASED_OUTPATIENT_CLINIC_OR_DEPARTMENT_OTHER): Payer: Self-pay

## 2024-06-20 ENCOUNTER — Ambulatory Visit (HOSPITAL_BASED_OUTPATIENT_CLINIC_OR_DEPARTMENT_OTHER)

## 2024-06-20 ENCOUNTER — Ambulatory Visit: Payer: Self-pay

## 2024-06-20 VITALS — BP 130/77 | HR 88 | Ht 69.0 in | Wt 163.2 lb

## 2024-06-20 DIAGNOSIS — J189 Pneumonia, unspecified organism: Secondary | ICD-10-CM | POA: Diagnosis not present

## 2024-06-20 DIAGNOSIS — J441 Chronic obstructive pulmonary disease with (acute) exacerbation: Secondary | ICD-10-CM | POA: Diagnosis not present

## 2024-06-20 MED ORDER — AZITHROMYCIN 250 MG PO TABS
ORAL_TABLET | ORAL | 0 refills | Status: DC
Start: 2024-06-20 — End: 2024-10-24

## 2024-06-20 MED ORDER — PREDNISONE 10 MG PO TABS
ORAL_TABLET | ORAL | 0 refills | Status: DC
Start: 2024-06-20 — End: 2024-10-24

## 2024-06-20 NOTE — Progress Notes (Signed)
 @Patient  ID: Christine Moore, female    DOB: 03/08/1946, 78 y.o.   MRN: [id number]  Chief Complaint  Patient presents with   Acute Visit    Fatigue,Cough no fever x 1 day    Referring provider: Regino Slater, MD  HPI: Patient is an 78 y/o female former smoker with PMH of asthma as a teen and COPD diagnosed in 2019 who presents today for an acute visit with a c/o fatigue, increased congestion, and dyspnea above her baseline for the last 1-2 days.  She is concerned that she has pneumonia since she has had it so many times in the past.  She denies fever, chills, sick contact exposure, hemoptysis, chest pain.  She does have a productive cough, but does not know what color the sputum is. Chart reviewed; it seems she has pneumonia several times a year, with a brief reprieve after a course of Linezolid for a separate infection in 2024.  She reports compliance with her Trelegy, and has not had to use her Xopenex .  She was scheduled for a follow up CT on 06/06/2024 but did not have it completed.    TEST/EVENTS : abdominal CT with renal lesions:  Renal/bladder US  planned for tomorrow 7/22 ED visit 6/27:  found to have E.coli in urine; treated with Keflex , finished on 7/11  Allergies  Allergen Reactions   Penicillins     Unknown reaction    Quinapril Swelling    Swelling of face    Latex Itching   Tessalon  [Benzonatate ] Swelling    Lip swelling. No throat swelling. Improved after stopping and taking benadryl   Formaldehyde Itching    Immunization History  Administered Date(s) Administered   Fluzone Influenza virus vaccine,trivalent (IIV3), split virus 09/28/2013   Influenza Inj Mdck Quad Pf 10/21/2013, 09/01/2019, 09/10/2020   Influenza Split 10/16/2004, 11/08/2009   Influenza, High Dose Seasonal PF 10/17/2016, 09/15/2017, 08/29/2021, 08/21/2022   Influenza, Seasonal, Injecte, Preservative Fre 09/23/2012   Influenza,inj,Quad PF,6+ Mos 09/18/2014, 09/21/2015   Influenza,inj,quad, With  Preservative 08/31/2017, 10/07/2017, 09/07/2018, 09/02/2019   Influenza-Unspecified 10/21/2013   Moderna Sars-Covid-2 Vaccination 12/20/2019, 01/17/2020, 10/08/2020, 04/02/2021   PNEUMOCOCCAL CONJUGATE-20 02/19/2023   Pfizer Covid-19 Vaccine Bivalent Booster 74yrs & up 09/29/2021   Pfizer(Comirnaty)Fall Seasonal Vaccine 12 years and older 12/04/2022   Pneumococcal Conjugate-13 05/28/2015, 05/27/2016   Pneumococcal Polysaccharide-23 12/21/2008, 12/01/2013, 05/31/2014   Tdap 04/03/2017, 02/19/2023   Zoster, Live 12/01/2013    Past Medical History:  Diagnosis Date   Atrial fibrillation (HCC)    Atrial flutter (HCC)    COPD (chronic obstructive pulmonary disease) (HCC)    Eczema    Hyperlipidemia    Hypothyroid    Urinary incontinence     Tobacco History: Social History   Tobacco Use  Smoking Status Former   Current packs/day: 0.00   Average packs/day: 1 pack/day for 22.0 years (22.0 ttl pk-yrs)   Types: Cigarettes   Start date: 1963   Quit date: 1985   Years since quitting: 40.5  Smokeless Tobacco Never   Counseling given: Not Answered   Outpatient Medications Prior to Visit  Medication Sig Dispense Refill   acyclovir (ZOVIRAX) 400 MG tablet Take 400 mg by mouth 3 (three) times daily as needed (fever blisters).     apixaban  (ELIQUIS ) 5 MG TABS tablet Take 1 tablet (5 mg total) by mouth 2 (two) times daily. Please notify new Cardiologist for refills. Thanks. 60 tablet 0   atorvastatin  (LIPITOR) 10 MG tablet Take 10 mg by mouth at  bedtime.     chlorpheniramine (CHLOR-TRIMETON) 4 MG tablet Take 4 mg by mouth See admin instructions. Take 4 mg twice daily, may increase to 4 times daily as needed for allergies     Cholecalciferol (VITAMIN D) 50 MCG (2000 UT) tablet Take 2,000 Units by mouth 2 (two) times daily.     clobetasol ointment (TEMOVATE) 0.05 % Apply 1 application  topically daily as needed (eczema).     diltiazem  (CARDIZEM  CD) 240 MG 24 hr capsule Take 1 capsule (240 mg  total) by mouth daily. (Patient taking differently: Take 240 mg by mouth daily. Pt stated they increased her dose to 240mg  when cards D/Cd her Multaq) 90 capsule 3   Fluticasone -Umeclidin-Vilant (TRELEGY ELLIPTA) 200-62.5-25 MCG/INH AEPB Inhale 1 puff into the lungs daily.     hydrocortisone 2.5 % cream Apply 1 Application topically daily as needed (facial eczema).     Ketotifen Fumarate (ALAWAY OP) Place 1 drop into both eyes daily as needed (allergies).     levalbuterol  (XOPENEX  HFA) 45 MCG/ACT inhaler Inhale 2 puffs into the lungs 4 (four) times daily as needed for shortness of breath or wheezing. 1 each 5   levalbuterol  (XOPENEX ) 0.63 MG/3ML nebulizer solution Take 3 mLs (0.63 mg total) by nebulization every 6 (six) hours as needed for wheezing or shortness of breath. 360 mL 12   levothyroxine  (SYNTHROID ) 50 MCG tablet Take 50 mcg by mouth daily.     losartan  (COZAAR ) 50 MG tablet Take 1 tablet (50 mg total) by mouth 2 (two) times daily. 180 tablet 2   Lutein-Zeaxanthin 25-5 MG CAPS Take 1 capsule by mouth at bedtime.     montelukast  (SINGULAIR ) 10 MG tablet Take 10 mg by mouth at bedtime.     naproxen sodium (ALEVE) 220 MG tablet Take 220 mg by mouth daily as needed (headaches).     omeprazole (PRILOSEC OTC) 20 MG tablet Take 20 mg by mouth daily before breakfast.     OVER THE COUNTER MEDICATION Take 250 mg by mouth daily. Magnesium 250mg      OVER THE COUNTER MEDICATION Take 500 mcg by mouth daily. Vitamin B12     polyethylene glycol (MIRALAX ) 17 g packet Take 17 g by mouth daily. 14 each 2   Probiotic Product (PROBIOTIC-10 PO) Take 1 tablet by mouth daily.     sertraline (ZOLOFT) 25 MG tablet Take 25 mg by mouth daily. (Patient taking differently: Take 50 mg by mouth daily.)     sodium chloride  (OCEAN) 0.65 % SOLN nasal spray Place 1 spray into both nostrils as needed for congestion.     cefpodoxime  (VANTIN ) 200 MG tablet Take 1 tablet (200 mg total) by mouth 2 (two) times daily. (Patient not  taking: Reported on 06/20/2024) 8 tablet 0   cephALEXin  (KEFLEX ) 500 MG capsule Take 1 capsule (500 mg total) by mouth 4 (four) times daily. (Patient not taking: Reported on 06/20/2024) 28 capsule 0   doxycycline  (VIBRA -TABS) 100 MG tablet Take 1 tablet (100 mg total) by mouth 2 (two) times daily. (Patient not taking: Reported on 06/20/2024) 8 tablet 0   No facility-administered medications prior to visit.     Review of Systems:   Constitutional:   No  weight loss, night sweats,  Fevers, chills,  or  lassitude.  Positive for fatigue  HEENT:   No headaches,  Difficulty swallowing,  Tooth/dental problems, or  Sore throat,                No sneezing,  itching, ear ache, nasal congestion, post nasal drip,   CV:  No chest pain,  Orthopnea, PND, swelling in lower extremities, anasarca, dizziness, palpitations, syncope.   GI  No heartburn, indigestion, abdominal pain, nausea, vomiting, diarrhea, change in bowel habits, loss of appetite, bloody stools.   Resp: Positive for:  shortness of breath with exertion and at rest, excess mucus, and productive cough,  No non-productive cough,  No coughing up of blood.    No wheezing.  No chest wall deformity  Skin: no rash or lesions.  GU: no dysuria, change in color of urine, no urgency or frequency.  No flank pain, no hematuria   MS:  No joint pain or swelling.  No decreased range of motion.  No back pain.    Physical Exam  BP 130/77   Pulse 88   Ht 5' 9 (1.753 m)   Wt 163 lb 3.2 oz (74 kg)   SpO2 96%   BMI 24.10 kg/m   GEN: A/Ox3; pleasant , NAD, well nourished    HEENT:  [place]/AT,  EACs-clear, TMs-wnl, NOSE-clear, THROAT-clear, no lesions, no postnasal drip or exudate noted.   NECK:  Supple w/ fair ROM; no JVD; normal carotid impulses w/o bruits; no thyromegaly or nodules palpated; no lymphadenopathy.    RESP  Wheezes and rhonchi in bilateral lower lobes; improves but does not resolve with cough.  no accessory muscle use, no dullness to  percussion  CARD:  RRR, no m/r/g, no peripheral edema, pulses intact, no cyanosis or clubbing.  GI:   Soft & nt; nml bowel sounds; no organomegaly or masses detected.   Musco: Warm bil, no deformities or joint swelling noted.   Neuro: alert, no focal deficits noted.    Skin: Warm, no lesions or rashes    Lab Results:  CBC    Component Value Date/Time   WBC 16.5 (H) 05/27/2024 1411   RBC 4.68 05/27/2024 1411   HGB 13.2 05/27/2024 1411   HCT 40.9 05/27/2024 1411   PLT 333 05/27/2024 1411   MCV 87.4 05/27/2024 1411   MCH 28.2 05/27/2024 1411   MCHC 32.3 05/27/2024 1411   RDW 15.5 05/27/2024 1411   LYMPHSABS 2.2 05/09/2021 0107   MONOABS 1.0 05/09/2021 0107   EOSABS 0.0 05/09/2021 0107   BASOSABS 0.0 05/09/2021 0107    BMET    Component Value Date/Time   NA 138 05/27/2024 1411   K 4.0 05/27/2024 1411   CL 104 05/27/2024 1411   CO2 20 (L) 05/27/2024 1411   GLUCOSE 99 05/27/2024 1411   BUN 14 05/27/2024 1411   CREATININE 0.83 05/27/2024 1411   CALCIUM  10.2 05/27/2024 1411   GFRNONAA >60 05/27/2024 1411    BNP    Component Value Date/Time   BNP 106.1 (H) 05/08/2021 0636    ProBNP No results found for: PROBNP  Imaging: CT ABDOMEN PELVIS W CONTRAST Result Date: 05/27/2024 CLINICAL DATA:  Left lower quadrant abdominal pain.  Dysuria. EXAM: CT ABDOMEN AND PELVIS WITH CONTRAST TECHNIQUE: Multidetector CT imaging of the abdomen and pelvis was performed using the standard protocol following bolus administration of intravenous contrast. RADIATION DOSE REDUCTION: This exam was performed according to the departmental dose-optimization program which includes automated exposure control, adjustment of the mA and/or kV according to patient size and/or use of iterative reconstruction technique. CONTRAST:  OMNIPAQUE  IOHEXOL  300 MG/ML  SOLN COMPARISON:  CT 04/20/2024 FINDINGS: Lower chest: The heart is enlarged. Pacemaker partially included. Scarring within bilateral lower  lobes. Hepatobiliary: There  is contrast refluxing into the hepatic veins and IVC. No focal liver abnormality. Gallbladder physiologically distended, no calcified stone. No biliary dilatation. Pancreas: Mild parenchymal atrophy. No ductal dilatation or inflammation. Spleen: Normal in size without focal abnormality. Adrenals/Urinary Tract: Normal adrenal glands. No hydronephrosis or renal inflammation. No perinephric fat stranding. Small bilateral low-density renal lesions are unchanged from CT last month. This includes an 11 mm posterior exophytic lesion from the left kidney series 2, image 28 with Hounsfield units of 22 and 10 mm water density lesion in the upper right kidney, series 2, image 23. There is marked bladder wall thickening with mild bladder wall enhancement and mild perivesicular fat stranding. Stomach/Bowel: Nondistended stomach. No small bowel obstruction or inflammation. There is fecalization of distal small bowel contents. Small to moderate volume of stool in the colon. No colonic wall thickening or pericolonic edema. Normal appendix is visualized. Occasional sigmoid colonic diverticula without diverticulitis. Vascular/Lymphatic: Aortic and branch atherosclerosis. No aortic aneurysm. No abdominopelvic adenopathy. Reproductive: Status post hysterectomy. No adnexal masses. Other: No free air, free fluid, or intra-abdominal fluid collection. Minimal fat in the left inguinal canal. Musculoskeletal: There are no acute or suspicious osseous abnormalities. Degenerative change in the spine. IMPRESSION: 1. Marked bladder wall thickening with mild bladder wall enhancement and mild perivesicular fat stranding, suspicious for cystitis. Recommend correlation with urinalysis. 2. Small bilateral low-density renal lesions are unchanged from CT last month. These are incompletely characterized on the current exam. Initial evaluation with elective ultrasound is recommended, although renal protocol MRI may be needed  for further characterization. 3. Fecalization of distal small bowel contents, can be seen with slow transit. 4. Colonic diverticulosis without diverticulitis. 5. Cardiomegaly with contrast refluxing into the hepatic veins and IVC, suggesting right heart dysfunction. Aortic Atherosclerosis (ICD10-I70.0). Electronically Signed   By: Andrea Gasman M.D.   On: 05/27/2024 16:19    Administration History     None          Latest Ref Rng & Units 01/22/2023   10:43 AM  PFT Results  FVC-Pre L 2.12   FVC-Predicted Pre % 63   FVC-Post L 2.41   FVC-Predicted Post % 72   Pre FEV1/FVC % % 68   Post FEV1/FCV % % 64   FEV1-Pre L 1.43   FEV1-Predicted Pre % 57   FEV1-Post L 1.53   DLCO uncorrected ml/min/mmHg 22.53   DLCO UNC% % 101   DLCO corrected ml/min/mmHg 22.67   DLCO COR %Predicted % 101   DLVA Predicted % 137   TLC L 7.02   TLC % Predicted % 121   RV % Predicted % 178     No results found for: NITRICOXIDE   Assessment & Plan:   Assessment & Plan Acute exacerbation of COPD with asthma (HCC) -  Course of oral steroids and antibiotics -  Follow up in clinic in 6-8 weeks; sooner if new or worsening sx and pending CT review Recurrent pneumonia -  CXR from 05/06/2024 noted an ill-defined opacification with bulging fissure; workup with CT was ordered but is still outstanding. -  Differential for recurrent pneumonias includes MAC vs. Postobstructive pneumonia vs. Atypical infection vs. Less likely incomplete treatment of infection -  Discussed options with patient:  plan for sputum culture/AFB.  Consider bronchoscopy as yield may be low from sputum culture -  Complete CT scan as ordered to further evaluate findings seen on CXR   Return in about 8 weeks (around 08/15/2024).  Candis Dandy, PA-C 06/20/2024

## 2024-06-20 NOTE — Telephone Encounter (Signed)
 FYI Only or Action Required?: FYI only for provider.  Patient is followed in Pulmonology for COPD, last seen on 05/06/2024 by Christine Moore ORN, NP.  Called Nurse Triage reporting Cough and Fatigue.  Symptoms began yesterday.  Interventions attempted: Nothing.  Symptoms are: gradually worsening.  Triage Disposition: See HCP Within 4 Hours (Or PCP Triage)  Patient/caregiver understands and will follow disposition?: Yes                             Copied from CRM [id number]. Topic: Clinical - Red Word Triage >> Jun 20, 2024  9:07 AM Christine Moore wrote: Red Word that prompted transfer to Nurse Triage: pt is very weak, can't stand or sit very long, coughing.  Pretty sure she has PNA again. Would like to know what to do. Reason for Disposition  Wheezing is present  Answer Assessment - Initial Assessment Questions E2C2 Pulmonary Triage - Initial Assessment Questions  1. Chief Complaint (e.g., cough, sob, wheezing, fever, chills, sweat or additional symptoms) *Go to specific symptom protocol after initial questions. Cough and generalized fatigue   2. How long have symptoms been present? Started yesterday   3. Have you tested for COVID or Flu? Note: If not, ask patient if a home test can be taken. If so, instruct patient to call back for positive results. Denies   4. Have you used any OTC meds to help with symptoms? If yes, ask What medications? Denies   5. Have you used your inhalers/maintenance medication? If yes, What medications? Levalbuterol , takes as needed, denies needing to use more frequently than baseline  6. Do you wear supplemental oxygen? If yes, How many liters are you supposed to use? Denies   7. Do you monitor your oxygen levels? If yes, What is your reading (oxygen level) today? 93-94%, typically 96-98%  1. ONSET: When did the cough begin?      Yesterday  2. SEVERITY: How bad is the cough today?      States she is  having frequent coughing spells, states coughing spells cause SOB 3. SPUTUM: Describe the color of your sputum (e.g., none, dry cough; clear, white, yellow, green)     Denies 5. DIFFICULTY BREATHING: Are you having difficulty breathing? If Yes, ask: How bad is it? (e.g., mild, moderate, severe)      Not too bad, SOB during coughing spells, denies SOB at rest, patient able to speak in clear and complete sentences while on phone with this RN 6. FEVER: Do you have a fever? If Yes, ask: What is your temperature, how was it measured, and when did it start?     Denies 8. LUNG HISTORY: Do you have any history of lung disease?  (e.g., pulmonary embolus, asthma, emphysema)     COPD 10. OTHER SYMPTOMS: Do you have any other symptoms? (e.g., runny nose, wheezing, chest pain)     Generalized fatigue, wheezing off and on  Protocols used: Cough - Acute Non-Productive-A-AH

## 2024-06-20 NOTE — Telephone Encounter (Signed)
 FYI

## 2024-06-20 NOTE — Assessment & Plan Note (Signed)
-    CXR from 05/06/2024 noted an ill-defined opacification with bulging fissure; workup with CT was ordered but is still outstanding. -  Differential for recurrent pneumonias includes MAC vs. Postobstructive pneumonia vs. Atypical infection vs. Less likely incomplete treatment of infection -  Discussed options with patient:  plan for sputum culture/AFB.  Consider bronchoscopy as yield may be low from sputum culture -  Complete CT scan as ordered to further evaluate findings seen on CXR

## 2024-06-20 NOTE — Patient Instructions (Signed)
 Complete steroids and antibiotics as prescribed.  Collect sputum samples and drop off as instructed.  Complete follow up Chest CT.  Follow up in clinic in 8 weeks; RTC sooner if new or worsening symptoms.

## 2024-06-28 ENCOUNTER — Ambulatory Visit (HOSPITAL_BASED_OUTPATIENT_CLINIC_OR_DEPARTMENT_OTHER): Admitting: Primary Care

## 2024-07-01 ENCOUNTER — Ambulatory Visit (HOSPITAL_BASED_OUTPATIENT_CLINIC_OR_DEPARTMENT_OTHER)
Admission: RE | Admit: 2024-07-01 | Discharge: 2024-07-01 | Disposition: A | Discharge status: Home / Self Care | Source: Ambulatory Visit | Attending: Primary Care | Admitting: Primary Care

## 2024-07-01 DIAGNOSIS — J189 Pneumonia, unspecified organism: Secondary | ICD-10-CM | POA: Diagnosis present

## 2024-07-21 ENCOUNTER — Encounter (HOSPITAL_BASED_OUTPATIENT_CLINIC_OR_DEPARTMENT_OTHER): Payer: Self-pay | Admitting: Pulmonary Disease

## 2024-07-22 ENCOUNTER — Ambulatory Visit: Payer: Self-pay | Admitting: Primary Care

## 2024-07-22 NOTE — Progress Notes (Signed)
 Please let patient know CT showed findings consistent with either chronic infection or  ongoing/recurrent aspiration which can cause this changes to her lungs   She should complete sputum culture that was ordered by Candis and recommend we order barium swallow to assess for possible aspiration

## 2024-07-22 NOTE — Telephone Encounter (Signed)
 Pt would like for you to advise

## 2024-07-25 ENCOUNTER — Other Ambulatory Visit: Payer: Self-pay

## 2024-07-25 DIAGNOSIS — J189 Pneumonia, unspecified organism: Secondary | ICD-10-CM

## 2024-07-25 NOTE — Progress Notes (Signed)
 Barium swallow ordered to evaluate for silent aspiration as cause of recurrent pneumonia.    Candis Dandy, PA-C 07/25/2024

## 2024-08-03 MED ORDER — PREDNISONE 10 MG PO TABS: ORAL_TABLET | ORAL | 0 refills | Status: AC

## 2024-08-03 MED ORDER — LEVOFLOXACIN 500 MG PO TABS: 500.0000 mg | ORAL_TABLET | Freq: Every day | ORAL | 0 refills | Status: DC

## 2024-08-03 NOTE — Addendum Note (Signed)
 Addended by: KASSIE ACQUANETTA BRADLEY on: 08/03/2024 05:37 PM   Modules accepted: Orders

## 2024-08-03 NOTE — Telephone Encounter (Signed)
 Please advise on meds.

## 2024-08-09 NOTE — Telephone Encounter (Signed)
**Note De-identified  [place] Obfuscation** Please advise 

## 2024-08-10 NOTE — Telephone Encounter (Signed)
 Pt notified on VM that we do have appts available this afternoon with Candis Dandy, PA she can call us  back to schedule also given red flags if she dosent want to schedule appt

## 2024-08-10 NOTE — Telephone Encounter (Signed)
 FYI

## 2024-08-15 ENCOUNTER — Encounter (HOSPITAL_BASED_OUTPATIENT_CLINIC_OR_DEPARTMENT_OTHER): Payer: Self-pay | Admitting: Pulmonary Disease

## 2024-08-15 ENCOUNTER — Ambulatory Visit (HOSPITAL_BASED_OUTPATIENT_CLINIC_OR_DEPARTMENT_OTHER): Admitting: Pulmonary Disease

## 2024-08-15 ENCOUNTER — Other Ambulatory Visit (HOSPITAL_BASED_OUTPATIENT_CLINIC_OR_DEPARTMENT_OTHER): Payer: Self-pay

## 2024-08-15 VITALS — BP 132/67 | HR 87 | Ht 69.0 in | Wt 170.9 lb

## 2024-08-15 DIAGNOSIS — Z87891 Personal history of nicotine dependence: Secondary | ICD-10-CM

## 2024-08-15 DIAGNOSIS — R918 Other nonspecific abnormal finding of lung field: Secondary | ICD-10-CM

## 2024-08-15 DIAGNOSIS — Z8701 Personal history of pneumonia (recurrent): Secondary | ICD-10-CM

## 2024-08-15 DIAGNOSIS — J4489 Other specified chronic obstructive pulmonary disease: Secondary | ICD-10-CM | POA: Diagnosis not present

## 2024-08-15 DIAGNOSIS — J449 Chronic obstructive pulmonary disease, unspecified: Secondary | ICD-10-CM

## 2024-08-15 MED ORDER — TRELEGY ELLIPTA 200-62.5-25 MCG/ACT IN AEPB: 1.0000 | INHALATION_SPRAY | Freq: Every day | RESPIRATORY_TRACT | 3 refills | Status: AC

## 2024-08-15 MED ORDER — COMIRNATY 30 MCG/0.3ML IM SUSY
0.3000 mL | PREFILLED_SYRINGE | Freq: Once | INTRAMUSCULAR | 0 refills | Status: AC
Start: 2024-08-15 — End: 2024-08-16
  Filled 2024-08-15: qty 0.3, 1d supply, fill #0

## 2024-08-15 NOTE — Progress Notes (Unsigned)
 Subjective:   PATIENT ID: Christine Moore Elbe GENDER: female DOB: [birth year], MRN: [id number]   HPI  Chief Complaint  Patient presents with   COPD    Pt states she is feeling better   Reason for Visit: Follow-up COPD/asthma  [name] is a 78 year old female with asthma, atrial fibrillation s/p ablations and cardioversions, HTN, HLD and chronic headaches who presents for follow-up asthma/COPD management.  Synopsis: She was diagnosed with asthma as a teen and COPD in 2019. She is on Trelegy, xopenex  and Singulair . She reports that she has had pneumonia four times from Aug 2021- Aug 2022. Has persistent productive cough that previously interrupted her ability to speak and be in public.   2022 Aug - Established care with [facility]. Exacerbation in 10/2021 2023 Exacerbation in Jan and May and December 2024 - Persistent cough. Trialed PPI without change. Completed pulmonary rehab. COPD exacerbation in June and July. CTA 06/10/23 neg for PE and bilateral lower lobe consolidation. PM placed in July and treated with linezolid for SSI. Cough resolved completely after this antibiotic.  07/29/23 Since our last visit she had PM placed on 06/25/23 and was prescribed linezolid for possible skin infection after the procedure. Since cough completely resolved. She is compliant with Trelegy. No recent albuterol  use but using nebulizer once a day. Denies shortness of breath or wheezing.   01/28/24 Still having an productive cough throughout the day with talking and activity. Less shortness of breath and using nebulizer less. Overall feeling better. Denies wheezing.  08/15/24 Since our last visit she was diagnosed with pneumonia in May and was treated doxycycline  and cefpodoxime . She had CT Chest in August with right sided abnormalities. She was unable to provide sputum cultures. She reports her shortness of breath has improved with her atrial fib medication. Minimal cough and no wheezing. She  reports dry throat.   Asthma Control Test ACT Total Score  07/29/2023 10:28 AM 25  06/18/2023 10:14 AM 12  02/06/2022 10:08 AM 22   Social History: Former smoker. Smoked 1 ppd x 10 years. Quit in 1985. Retired Runner, broadcasting/film/video Significant second smoke exposure Mom had asthma Moving in with her sister  Past Medical History:  Diagnosis Date   Atrial fibrillation (HCC)    Atrial flutter (HCC)    COPD (chronic obstructive pulmonary disease) (HCC)    Eczema    Hyperlipidemia    Hypothyroid    Urinary incontinence      Family History  Problem Relation Age of Onset   Melanoma Sister    Breast cancer Sister 74   Endometrial cancer Sister      Social History   Occupational History   Not on file  Tobacco Use   Smoking status: Former    Current packs/day: 0.00    Average packs/day: 1 pack/day for 22.0 years (22.0 ttl pk-yrs)    Types: Cigarettes    Start date: 1963    Quit date: 1985    Years since quitting: 40.7   Smokeless tobacco: Never  Vaping Use   Vaping status: Never Used  Substance and Sexual Activity   Alcohol use: Not Currently    Comment: drinks 1 drink per day of all the above   Drug use: Never   Sexual activity: Not Currently    Allergies  Allergen Reactions   Penicillins     Unknown reaction    Quinapril Swelling    Swelling of face    Latex Itching   Tessalon  [Benzonatate ]  Swelling    Lip swelling. No throat swelling. Improved after stopping and taking benadryl   Formaldehyde Itching     Outpatient Medications Prior to Visit  Medication Sig Dispense Refill   acyclovir (ZOVIRAX) 400 MG tablet Take 400 mg by mouth 3 (three) times daily as needed (fever blisters).     apixaban  (ELIQUIS ) 5 MG TABS tablet Take 1 tablet (5 mg total) by mouth 2 (two) times daily. Please notify new Cardiologist for refills. Thanks. 60 tablet 0   atorvastatin  (LIPITOR) 10 MG tablet Take 10 mg by mouth at bedtime.     chlorpheniramine (CHLOR-TRIMETON) 4 MG tablet Take 4 mg by  mouth See admin instructions. Take 4 mg twice daily, may increase to 4 times daily as needed for allergies     Cholecalciferol (VITAMIN D) 50 MCG (2000 UT) tablet Take 2,000 Units by mouth 2 (two) times daily.     clobetasol ointment (TEMOVATE) 0.05 % Apply 1 application  topically daily as needed (eczema).     diltiazem  (CARDIZEM  CD) 240 MG 24 hr capsule Take 1 capsule (240 mg total) by mouth daily. (Patient taking differently: Take 240 mg by mouth daily. Pt stated they increased her dose to 240mg  when cards D/Cd her Multaq) 90 capsule 3   Fluticasone -Umeclidin-Vilant (TRELEGY ELLIPTA ) 200-62.5-25 MCG/INH AEPB Inhale 1 puff into the lungs daily.     hydrocortisone 2.5 % cream Apply 1 Application topically daily as needed (facial eczema).     Ketotifen Fumarate (ALAWAY OP) Place 1 drop into both eyes daily as needed (allergies).     levalbuterol  (XOPENEX  HFA) 45 MCG/ACT inhaler Inhale 2 puffs into the lungs 4 (four) times daily as needed for shortness of breath or wheezing. 1 each 5   levalbuterol  (XOPENEX ) 0.63 MG/3ML nebulizer solution Take 3 mLs (0.63 mg total) by nebulization every 6 (six) hours as needed for wheezing or shortness of breath. 360 mL 12   levothyroxine  (SYNTHROID ) 50 MCG tablet Take 50 mcg by mouth daily.     losartan  (COZAAR ) 50 MG tablet Take 1 tablet (50 mg total) by mouth 2 (two) times daily. 180 tablet 2   Lutein-Zeaxanthin 25-5 MG CAPS Take 1 capsule by mouth at bedtime.     montelukast  (SINGULAIR ) 10 MG tablet Take 10 mg by mouth at bedtime.     naproxen sodium (ALEVE) 220 MG tablet Take 220 mg by mouth daily as needed (headaches).     omeprazole (PRILOSEC OTC) 20 MG tablet Take 20 mg by mouth daily before breakfast.     OVER THE COUNTER MEDICATION Take 250 mg by mouth daily. Magnesium 250mg      OVER THE COUNTER MEDICATION Take 500 mcg by mouth daily. Vitamin B12     polyethylene glycol (MIRALAX ) 17 g packet Take 17 g by mouth daily. 14 each 2   Probiotic Product  (PROBIOTIC-10 PO) Take 1 tablet by mouth daily.     sertraline (ZOLOFT) 25 MG tablet Take 25 mg by mouth daily.     sodium chloride  (OCEAN) 0.65 % SOLN nasal spray Place 1 spray into both nostrils as needed for congestion.     azithromycin  (ZITHROMAX  Z-PAK) 250 MG tablet Take 2 tabs PO today, then one tab PO daily until done (Patient not taking: Reported on 08/15/2024) 6 tablet 0   cefpodoxime  (VANTIN ) 200 MG tablet Take 1 tablet (200 mg total) by mouth 2 (two) times daily. (Patient not taking: Reported on 08/15/2024) 8 tablet 0   cephALEXin  (KEFLEX ) 500 MG capsule Take  1 capsule (500 mg total) by mouth 4 (four) times daily. (Patient not taking: Reported on 08/15/2024) 28 capsule 0   doxycycline  (VIBRA -TABS) 100 MG tablet Take 1 tablet (100 mg total) by mouth 2 (two) times daily. (Patient not taking: Reported on 08/15/2024) 8 tablet 0   levofloxacin  (LEVAQUIN ) 500 MG tablet Take 1 tablet (500 mg total) by mouth daily. (Patient not taking: Reported on 08/15/2024) 7 tablet 0   predniSONE  (DELTASONE ) 10 MG tablet 4 tabs for 2 days, then 3 tabs for 2 days, 2 tabs for 2 days, then 1 tab for 2 days, then stop (Patient not taking: Reported on 08/15/2024) 20 tablet 0   No facility-administered medications prior to visit.    Review of Systems  Constitutional:  Negative for chills, diaphoresis, fever, malaise/fatigue and weight loss.  HENT:  Negative for congestion.   Respiratory:  Positive for shortness of breath. Negative for cough, hemoptysis, sputum production and wheezing.   Cardiovascular:  Negative for chest pain, palpitations and leg swelling.     Objective:   Vitals:   08/15/24 1432  BP: 132/67  Pulse: 87  SpO2: 95%  Weight: 170 lb 14.4 oz (77.5 kg)  Height: 5' 9 (1.753 m)   SpO2: 95 %  Physical Exam: General: Well-appearing, no acute distress HENT: [place], AT Eyes: EOMI, no scleral icterus Respiratory: Clear to auscultation bilaterally.  No crackles, wheezing or rales Cardiovascular:  RRR, -M/R/G, no JVD Extremities:-Edema,-tenderness Neuro: AAO x4, CNII-XII grossly intact Psych: Normal mood, normal affect  Data Reviewed:  Imaging: CTA 05/08/21 - No pulmonary embolus, patchy bilateral lower lobe airspace consolidation left> right, 4 mm right upper lobe perifissural nodule CXR 12/24/2021-right perihilar pneumonia CTA 06/10/23 ([facility] report only) - neg for PE and bilateral lower lobe consolidation CT Chest 07/23/23 - Resolved bilateral lobe consolidation with atelectasis and bandlike scarring in LLL CT Chest 01/22/24 - Slightly improved infiltrates especially in the bases compared to 07/23/23 and significantly improved compared to CTA 05/08/21 in LLL infiltrate CT Chest 07/01/24 - Bandlike scarring in right middle and right lower lobe, tree in bud present  PFT: 01/26/23 FVC 2.41 (72%) FEV1 1.53 (61%) Ratio 64  TLC 121% TV 178% RV/TLC 141% DLCO 101% Interpretation: Moderate obstructive defect with significant bronchodilator response in FVC. Hyperinflation and air trapping present.  Labs: Alpha 1-antitrypsin MZ - 91 (LLN 101) Immunoglobulins G/A/M - normal range Eosinophil 05/22/21 - 0    Assessment & Plan:   Discussion: 77 year old female former smoker with COPD-asthma overlap and recurrent exacerbations +/- pneumonia 2-3 times annually that are slow to resolve. Had 6 month period with no exacerbations from 12/2022-05/2023. Recent CT imaging including 07/01/24 demonstrate resolved LLL  infiltrate but now involvement of RML and RLL. Suspect the changes in her CT are related to silent aspiration. Swallow evaluation ordered but not completed. Plan for repeat imaging and will consider swallow evaluation +/- bronchoscopy. Discussed clinical course and management of COPD/asthma including bronchodilator regimen, preventive care including vaccinations and action plan for exacerbation.  COPD-asthma overlap  --CONTINUE Trelegy 200-62.5-25 mcg ONE puff ONCE a day. --CONTINUE flutter valve twice  a day AS NEEDED --CONTINUE Xopenex  nebulizers twice a day followed by flutter valve. --CONTINUE singulair  10 mg daily --OK to take prednisone  20 mg ( 1 pill ) x 5 days for symptoms including worsening productive cough, shortness of breath and wheezing that has been present for 48 hours  Hx recurrent pneumonia High risk for aspiration --Reviewed CT 01/22/24 with overall improved LLL infiltrate  compared to 2022 --ORDER CT Chest without contrast in October 2025 --If infiltrate persistent, will arrange bronchoscopy for BAL  **If needs CT need in the future, she has used alprazolam  1-2 mg PRN for anxiety**  Health Maintenance Immunization History  Administered Date(s) Administered   Fluzone Influenza virus vaccine,trivalent (IIV3), split virus 09/28/2013   INFLUENZA, HIGH DOSE SEASONAL PF 10/17/2016, 09/15/2017, 08/29/2021, 08/21/2022   Influenza Inj Mdck Quad Pf 10/21/2013, 09/01/2019, 09/10/2020   Influenza Split 10/16/2004, 11/08/2009   Influenza, Seasonal, Injecte, Preservative Fre 09/23/2012   Influenza,inj,Quad PF,6+ Mos 09/18/2014, 09/21/2015   Influenza,inj,quad, With Preservative 08/31/2017, 10/07/2017, 09/07/2018, 09/02/2019   Influenza-Unspecified 10/21/2013   Moderna Sars-Covid-2 Vaccination 12/20/2019, 01/17/2020, 10/08/2020, 04/02/2021   PNEUMOCOCCAL CONJUGATE-20 02/19/2023   Pfizer Covid-19 Vaccine Bivalent Booster 63yrs & up 09/29/2021   Pfizer(Comirnaty )Fall Seasonal Vaccine 12 years and older 12/04/2022   Pneumococcal Conjugate-13 05/28/2015, 05/27/2016   Pneumococcal Polysaccharide-23 12/21/2008, 12/01/2013, 05/31/2014   Tdap 04/03/2017, 02/19/2023   Zoster, Live 12/01/2013   CT Lung Screen - not qualified due to frequent CT  Orders Placed This Encounter  Procedures   CT Chest Wo Contrast    Standing Status:   Future    Expiration Date:   08/15/2025    Scheduling Instructions:     Schedule in October 2025.    Preferred imaging location?:   [facility]    Meds ordered this encounter  Medications   Fluticasone -Umeclidin-Vilant (TRELEGY ELLIPTA ) 200-62.5-25 MCG/ACT AEPB    Sig: Inhale 1 puff into the lungs daily.    Dispense:  180 each    Refill:  3    Dispense 3 inhalers   Return in about 1 month (around 09/14/2024).  I have spent a total time of 31-minutes on the day of the appointment including chart review, data review, collecting history, coordinating care and discussing medical diagnosis and plan with the patient/family. Past medical history, allergies, medications were reviewed. Pertinent imaging, labs and tests included in this note have been reviewed and interpreted independently by me.  Vanassa Penniman Slater Staff, MD [facility] 08/15/2024 3:05 PM

## 2024-08-15 NOTE — Patient Instructions (Addendum)
 COPD-asthma overlap  --CONTINUE Trelegy 200-62.5-25 mcg ONE puff ONCE a day. --CONTINUE flutter valve twice a day AS NEEDED --CONTINUE Xopenex  nebulizers twice a day followed by flutter valve. --CONTINUE singulair  10 mg daily   Hx recurrent pneumonia High risk for aspiration --Reviewed CT 01/22/24 with overall improved LLL infiltrate compared to 2022 --ORDER CT Chest without contrast in October 2025. Will call with results --If infiltrate persistent, will arrange bronchoscopy for BAL

## 2024-09-05 ENCOUNTER — Other Ambulatory Visit (HOSPITAL_COMMUNITY): Payer: Self-pay | Admitting: *Deleted

## 2024-09-05 DIAGNOSIS — R1310 Dysphagia, unspecified: Secondary | ICD-10-CM

## 2024-09-13 ENCOUNTER — Ambulatory Visit (HOSPITAL_BASED_OUTPATIENT_CLINIC_OR_DEPARTMENT_OTHER)
Admission: RE | Admit: 2024-09-13 | Discharge: 2024-09-13 | Disposition: A | Discharge status: Home / Self Care | Source: Ambulatory Visit | Attending: Pulmonary Disease | Admitting: Pulmonary Disease

## 2024-09-13 DIAGNOSIS — R918 Other nonspecific abnormal finding of lung field: Secondary | ICD-10-CM | POA: Insufficient documentation

## 2024-09-20 ENCOUNTER — Ambulatory Visit (HOSPITAL_COMMUNITY)
Admission: RE | Admit: 2024-09-20 | Discharge: 2024-09-20 | Disposition: A | Source: Ambulatory Visit | Attending: Family Medicine | Admitting: Family Medicine

## 2024-09-20 ENCOUNTER — Ambulatory Visit (HOSPITAL_COMMUNITY)
Admission: RE | Admit: 2024-09-20 | Discharge: 2024-09-20 | Disposition: A | Discharge status: Home / Self Care | Source: Ambulatory Visit | Attending: Family Medicine | Admitting: Family Medicine

## 2024-09-20 DIAGNOSIS — J189 Pneumonia, unspecified organism: Secondary | ICD-10-CM | POA: Insufficient documentation

## 2024-09-20 DIAGNOSIS — R1310 Dysphagia, unspecified: Secondary | ICD-10-CM | POA: Diagnosis present

## 2024-09-20 DIAGNOSIS — Z87891 Personal history of nicotine dependence: Secondary | ICD-10-CM | POA: Insufficient documentation

## 2024-09-20 NOTE — Progress Notes (Signed)
 Modified Barium Swallow Study  Patient Details  Name: Christine Moore MRN: [id number] Date of Birth: [birth year]  Today's Date: 09/20/2024  HPI/PMH: HPI: Pt is an 78 yo female referred by her pulmonary PA due to concern for pt aspirating due to her having recurrent pna.  PMH + for COPD, prior smoker quit 1985 after 1PPD x22 years, ETOH x1 drink daily, concern for aspiration risk, sinus drainage. CT Chest 07/01/24 - Bandlike scarring in right middle and right lower lobe, tree in bud present,.  She reports she takes Prilosec 20 mg before breakfast daily over the last 1-2 years - as prior to this she was having symptoms of heartburn. She also takes Singulairr. Has seen an ENT for dizziness and remote history of seeing an allergist. Other medical history includes diverticulosis, hemorrhoid, bladder incontinence, hypothyroid, eczema, Afib s/p ablation and cardioversion, and has pacemaker, HAs.    She also uses Trelegy, productive cough t/o day and reports this is cough is chronic. Pt reports occasional issues with swallowing with food causing her to choke maybe 4 times in her life. Occasionally she senses pills sticking in throat.   Clinical Impression: Clinical Impression: Functional oropharyngeal swallow for Christine Moore's age noted.  She has shallow laryngeal penetration of thin liquids with decreased laryngeal closure/airway closure but no aspiration even when challenged to sequential boluses.  Pt's swallow is strong without retention.  She was observed to cough during her MBS after swallow of solid - but did not have airway infiltration of barium. Pt reported ongoing issues with secretions being retained in her throat.  Reviewed MBS with her using teach back.   Factors that may increase risk of adverse event in presence of aspiration Christine Moore & Christine Moore 2021): Factors that may increase risk of adverse event in presence of aspiration Christine Moore & Christine Moore 2021): Respiratory or GI  disease   Recommendations/Plan: Swallowing Evaluation Recommendations Swallowing Evaluation Recommendations Recommendations: PO diet PO Diet Recommendation: Regular; Thin liquids (Level 0) Liquid Administration via: Cup; Straw Medication Administration: Whole meds with liquid Supervision: Patient able to self-feed    Treatment Plan No data recorded   Recommendations Recommendations for follow up therapy are one component of a multi-disciplinary discharge planning process, led by the attending physician.  Recommendations may be updated based on patient status, additional functional criteria and insurance authorization.  Assessment: Orofacial Exam: No data recorded  Anatomy:  Anatomy: WFL   Boluses Administered: Boluses Administered Boluses Administered: Mildly thick liquids (Level 2, nectar thick); Thin liquids (Level 0); Moderately thick liquids (Level 3, honey thick); Puree; Solid     Oral Impairment Domain: Oral Impairment Domain Lip Closure: No labial escape Tongue control during bolus hold: Cohesive bolus between tongue to palatal seal Bolus preparation/mastication: Timely and efficient chewing and mashing Bolus transport/lingual motion: Brisk tongue motion Oral residue: Trace residue lining oral structures Location of oral residue : Tongue Initiation of pharyngeal swallow : Valleculae; Posterior laryngeal surface of the epiglottis; Pyriform sinuses (liquids to pyriform at latest, solids/puree valleculae)     Pharyngeal Impairment Domain: Pharyngeal Impairment Domain Soft palate elevation: No bolus between soft palate (SP)/pharyngeal wall (PW) Laryngeal elevation: Partial superior movement of thyroid cartilage/partial approximation of arytenoids to epiglottic petiole Anterior hyoid excursion: Complete anterior movement Epiglottic movement: Complete inversion Laryngeal vestibule closure: Incomplete, narrow column air/contrast in laryngeal vestibule Pharyngeal  stripping wave : Present - complete Pharyngeal contraction (A/P view only): -- (DNT) Pharyngoesophageal segment opening: Complete distension and complete duration, no obstruction of flow Tongue base retraction: No  contrast between tongue base and posterior pharyngeal wall (PPW) Pharyngeal residue: Complete pharyngeal clearance Location of pharyngeal residue: N/A     Esophageal Impairment Domain: Esophageal Impairment Domain Esophageal clearance upright position: Complete clearance, esophageal coating    Pill: Pill Consistency administered: Thin liquids (Level 0) Thin liquids (Level 0): [facility]    Penetration/Aspiration Scale Score: Penetration/Aspiration Scale Score 1.  Material does not enter airway: Mildly thick liquids (Level 2, nectar thick); Moderately thick liquids (Level 3, honey thick); Puree; Solid; Pill 2.  Material enters airway, remains ABOVE vocal cords then ejected out: Thin liquids (Level 0)    Compensatory Strategies: No data recorded     General Information: Caregiver present: No   Diet Prior to this Study: Regular; Thin liquids (Level 0)    No data recorded   Respiratory Status: WFL    Supplemental O2: None (Room air)    History of Recent Intubation: No   Behavior/Cognition: Alert; Cooperative  Self-Feeding Abilities: Able to self-feed  Baseline vocal quality/speech: Normal  Volitional Cough: Able to elicit  Volitional Swallow: Able to elicit  Exam Limitations: Poor bolus acceptance   Goal Planning: No data recorded No data recorded No data recorded No data recorded No data recorded  Pain: Pain Assessment Pain Assessment: No/denies pain    End of Session: Start Time:SLP Start Time (ACUTE ONLY): 1305  Stop Time: SLP Stop Time (ACUTE ONLY): 1331  Time Calculation:SLP Time Calculation (min) (ACUTE ONLY): 26 min  Charges: SLP Evaluations $ SLP Speech Visit: 1 Visit  SLP Evaluations $Outpatient MBS Swallow: 1  Procedure   SLP visit diagnosis: SLP Visit Diagnosis: Dysphagia, unspecified (R13.10)    Past Medical History:  Past Medical History:  Diagnosis Date   Atrial fibrillation (HCC)    Atrial flutter (HCC)    COPD (chronic obstructive pulmonary disease) (HCC)    Eczema    Hyperlipidemia    Hypothyroid    Urinary incontinence    Past Surgical History:  Past Surgical History:  Procedure Laterality Date   ABDOMINAL HYSTERECTOMY     CARDIOVERSION N/A 05/10/2021   Procedure: CARDIOVERSION;  Surgeon: Jeffrie Oneil BROCKS, MD;  Location: [facility] ENDOSCOPY;  Service: Cardiovascular;  Laterality: N/A;   CARDIOVERSION N/A 10/08/2022   Procedure: CARDIOVERSION;  Surgeon: Sheena Pugh, DO;  Location: [facility] ENDOSCOPY;  Service: Cardiovascular;  Laterality: N/A;   OOPHORECTOMY Bilateral    pace maker  06/25/2023   TEE WITHOUT CARDIOVERSION N/A 05/10/2021   Procedure: TRANSESOPHAGEAL ECHOCARDIOGRAM (TEE);  Surgeon: Jeffrie Oneil BROCKS, MD;  Location: [facility] ENDOSCOPY;  Service: Cardiovascular;  Laterality: N/A;   Madelin POUR, [facility] SLP Acute Rehab Services Office [phone]  Nicolas Emmie Caldron 09/20/2024, 2:28 PM

## 2024-09-22 ENCOUNTER — Ambulatory Visit: Payer: Self-pay | Admitting: Pulmonary Disease

## 2024-10-24 ENCOUNTER — Ambulatory Visit (HOSPITAL_BASED_OUTPATIENT_CLINIC_OR_DEPARTMENT_OTHER): Admitting: Pulmonary Disease

## 2024-10-24 ENCOUNTER — Encounter (HOSPITAL_BASED_OUTPATIENT_CLINIC_OR_DEPARTMENT_OTHER): Payer: Self-pay | Admitting: Pulmonary Disease

## 2024-10-24 VITALS — BP 124/68 | HR 80 | Temp 98.9°F | Ht 69.0 in | Wt 181.4 lb

## 2024-10-24 DIAGNOSIS — J4489 Other specified chronic obstructive pulmonary disease: Secondary | ICD-10-CM | POA: Diagnosis not present

## 2024-10-24 DIAGNOSIS — K219 Gastro-esophageal reflux disease without esophagitis: Secondary | ICD-10-CM

## 2024-10-24 DIAGNOSIS — J189 Pneumonia, unspecified organism: Secondary | ICD-10-CM

## 2024-10-24 MED ORDER — ESOMEPRAZOLE MAGNESIUM 40 MG PO CPDR
40.0000 mg | DELAYED_RELEASE_CAPSULE | Freq: Every day | ORAL | 3 refills | Status: AC
Start: 2024-10-24 — End: ?

## 2024-10-24 MED ORDER — LEVALBUTEROL TARTRATE 45 MCG/ACT IN AERO: 2.0000 | INHALATION_SPRAY | Freq: Four times a day (QID) | RESPIRATORY_TRACT | 5 refills | Status: AC | PRN

## 2024-10-24 MED ORDER — LEVALBUTEROL HCL 0.63 MG/3ML IN NEBU: 0.6300 mg | INHALATION_SOLUTION | Freq: Four times a day (QID) | RESPIRATORY_TRACT | 5 refills | Status: AC | PRN

## 2024-10-24 MED ORDER — MONTELUKAST SODIUM 10 MG PO TABS
10.0000 mg | ORAL_TABLET | Freq: Every day | ORAL | 3 refills | Status: AC
Start: 2024-10-24 — End: ?

## 2024-10-24 NOTE — Progress Notes (Signed)
 Subjective:   PATIENT ID: Christine Moore Elbe GENDER: female DOB: 1946/08/10, MRN: [id number]   HPI  Chief Complaint  Patient presents with   COPD   Reason for Visit: Follow-up COPD/asthma  [name] is a 78 year old female with asthma, atrial fibrillation s/p ablations and cardioversions, HTN, HLD and chronic headaches who presents for follow-up asthma/COPD management.  Synopsis: She was diagnosed with asthma as a teen and COPD in 2019. She is on Trelegy, xopenex  and Singulair . She reports that she has had pneumonia four times from Aug 2021- Aug 2022. Has persistent productive cough that previously interrupted her ability to speak and be in public.   2022 Aug - Established care with [facility]. Exacerbation in 10/2021 2023 Exacerbation in Jan and May and December 2024 - Persistent cough. Trialed PPI without change. Completed pulmonary rehab. COPD exacerbation in June and July. CTA 06/10/23 neg for PE and bilateral lower lobe consolidation. PM placed in July and treated with linezolid for SSI. Cough resolved completely after this antibiotic.  07/29/23 Since our last visit she had PM placed on 06/25/23 and was prescribed linezolid for possible skin infection after the procedure. Since cough completely resolved. She is compliant with Trelegy. No recent albuterol  use but using nebulizer once a day. Denies shortness of breath or wheezing.   01/28/24 Still having an productive cough throughout the day with talking and activity. Less shortness of breath and using nebulizer less. Overall feeling better. Denies wheezing.  08/15/24 Since our last visit she was diagnosed with pneumonia in May and was treated doxycycline  and cefpodoxime . She had CT Chest in August with right sided abnormalities. She was unable to provide sputum cultures. She reports her shortness of breath has improved with her atrial fib medication. Minimal cough and no wheezing. She reports dry throat.  10/24/24 Since  our last visit she reports overall doing ok. Denies shortness of breath or wheezing. Compliant with Trelegy. Rarely uses xopenex  except when sick. She is coughing frequently due to allergies and indigestion. Taking prilosec daily and does not feel like its effective.   Asthma Control Test ACT Total Score  07/29/2023 10:28 AM 25  06/18/2023 10:14 AM 12  02/06/2022 10:08 AM 22   Social History: Former smoker. Smoked 1 ppd x 10 years. Quit in 1985. Retired runner, broadcasting/film/video Significant second smoke exposure Mom had asthma Moving in with her sister  Past Medical History:  Diagnosis Date   Atrial fibrillation (HCC)    Atrial flutter (HCC)    COPD (chronic obstructive pulmonary disease) (HCC)    Eczema    Hyperlipidemia    Hypothyroid    Urinary incontinence      Family History  Problem Relation Age of Onset   Melanoma Sister    Breast cancer Sister 22   Endometrial cancer Sister      Social History   Occupational History   Not on file  Tobacco Use   Smoking status: Former    Current packs/day: 0.00    Average packs/day: 1 pack/day for 22.0 years (22.0 ttl pk-yrs)    Types: Cigarettes    Start date: 1963    Quit date: 15    Years since quitting: 40.9   Smokeless tobacco: Never  Vaping Use   Vaping status: Never Used  Substance and Sexual Activity   Alcohol use: Not Currently    Comment: drinks 1 drink per day of all the above   Drug use: Never   Sexual activity: Not Currently  Allergies  Allergen Reactions   Penicillins     Unknown reaction    Quinapril Swelling    Swelling of face    Latex Itching   Tessalon  [Benzonatate ] Swelling    Lip swelling. No throat swelling. Improved after stopping and taking benadryl   Formaldehyde Itching     Outpatient Medications Prior to Visit  Medication Sig Dispense Refill   acyclovir (ZOVIRAX) 400 MG tablet Take 400 mg by mouth 3 (three) times daily as needed (fever blisters).     apixaban  (ELIQUIS ) 5 MG TABS tablet Take 1  tablet (5 mg total) by mouth 2 (two) times daily. Please notify new Cardiologist for refills. Thanks. 60 tablet 0   atorvastatin  (LIPITOR) 10 MG tablet Take 10 mg by mouth at bedtime.     chlorpheniramine (CHLOR-TRIMETON) 4 MG tablet Take 4 mg by mouth See admin instructions. Take 4 mg twice daily, may increase to 4 times daily as needed for allergies     Cholecalciferol (VITAMIN D) 50 MCG (2000 UT) tablet Take 2,000 Units by mouth 2 (two) times daily.     clobetasol ointment (TEMOVATE) 0.05 % Apply 1 application  topically daily as needed (eczema).     diltiazem  (CARDIZEM  CD) 240 MG 24 hr capsule Take 1 capsule (240 mg total) by mouth daily. 90 capsule 3   Fluticasone -Umeclidin-Vilant (TRELEGY ELLIPTA ) 200-62.5-25 MCG/ACT AEPB Inhale 1 puff into the lungs daily. 180 each 3   hydrocortisone 2.5 % cream Apply 1 Application topically daily as needed (facial eczema).     Ketotifen Fumarate (ALAWAY OP) Place 1 drop into both eyes daily as needed (allergies).     levothyroxine  (SYNTHROID ) 50 MCG tablet Take 50 mcg by mouth daily.     losartan  (COZAAR ) 50 MG tablet Take 1 tablet (50 mg total) by mouth 2 (two) times daily. 180 tablet 2   Lutein-Zeaxanthin 25-5 MG CAPS Take 1 capsule by mouth at bedtime.     naproxen sodium (ALEVE) 220 MG tablet Take 220 mg by mouth daily as needed (headaches).     OVER THE COUNTER MEDICATION Take 250 mg by mouth daily. Magnesium  250mg      OVER THE COUNTER MEDICATION Take 500 mcg by mouth daily. Vitamin B12     Probiotic Product (PROBIOTIC-10 PO) Take 1 tablet by mouth daily.     sodium chloride  (OCEAN) 0.65 % SOLN nasal spray Place 1 spray into both nostrils as needed for congestion.     levalbuterol  (XOPENEX  HFA) 45 MCG/ACT inhaler Inhale 2 puffs into the lungs 4 (four) times daily as needed for shortness of breath or wheezing. 1 each 5   levalbuterol  (XOPENEX ) 0.63 MG/3ML nebulizer solution Take 3 mLs (0.63 mg total) by nebulization every 6 (six) hours as needed for  wheezing or shortness of breath. 360 mL 12   montelukast  (SINGULAIR ) 10 MG tablet Take 10 mg by mouth at bedtime.     omeprazole (PRILOSEC OTC) 20 MG tablet Take 20 mg by mouth daily before breakfast.     cephALEXin  (KEFLEX ) 500 MG capsule Take 1 capsule (500 mg total) by mouth 4 (four) times daily. (Patient not taking: Reported on 10/24/2024) 28 capsule 0   polyethylene glycol (MIRALAX ) 17 g packet Take 17 g by mouth daily. (Patient not taking: Reported on 10/24/2024) 14 each 2   sertraline (ZOLOFT) 25 MG tablet Take 25 mg by mouth daily. (Patient not taking: Reported on 10/24/2024)     azithromycin  (ZITHROMAX  Z-PAK) 250 MG tablet Take 2 tabs PO today,  then one tab PO daily until done (Patient not taking: Reported on 10/24/2024) 6 tablet 0   cefpodoxime  (VANTIN ) 200 MG tablet Take 1 tablet (200 mg total) by mouth 2 (two) times daily. (Patient not taking: Reported on 10/24/2024) 8 tablet 0   doxycycline  (VIBRA -TABS) 100 MG tablet Take 1 tablet (100 mg total) by mouth 2 (two) times daily. (Patient not taking: Reported on 10/24/2024) 8 tablet 0   levofloxacin  (LEVAQUIN ) 500 MG tablet Take 1 tablet (500 mg total) by mouth daily. (Patient not taking: Reported on 10/24/2024) 7 tablet 0   predniSONE  (DELTASONE ) 10 MG tablet 4 tabs for 2 days, then 3 tabs for 2 days, 2 tabs for 2 days, then 1 tab for 2 days, then stop (Patient not taking: Reported on 10/24/2024) 20 tablet 0   No facility-administered medications prior to visit.    Review of Systems  Constitutional:  Negative for chills, diaphoresis, fever, malaise/fatigue and weight loss.  HENT:  Negative for congestion.   Respiratory:  Positive for shortness of breath. Negative for cough, hemoptysis, sputum production and wheezing.   Cardiovascular:  Negative for chest pain, palpitations and leg swelling.     Objective:   Vitals:   10/24/24 1405  BP: 124/68  Pulse: 80  Temp: 98.9 F (37.2 C)  SpO2: 95%  Weight: 181 lb 6.4 oz (82.3 kg)   Height: 5' 9 (1.753 m)   SpO2: 95 %  Physical Exam: General: Well-appearing, no acute distress HENT: [place], AT Eyes: EOMI, no scleral icterus Respiratory: Clear to auscultation bilaterally.  No crackles, wheezing or rales Cardiovascular: RRR, -M/R/G, no JVD Extremities:-Edema,-tenderness Neuro: AAO x4, CNII-XII grossly intact Psych: Normal mood, normal affect  Data Reviewed:  Imaging: CTA 05/08/21 - No pulmonary embolus, patchy bilateral lower lobe airspace consolidation left> right, 4 mm right upper lobe perifissural nodule CXR 12/24/2021-right perihilar pneumonia CTA 06/10/23 ([facility] report only) - neg for PE and bilateral lower lobe consolidation CT Chest 07/23/23 - Resolved bilateral lobe consolidation with atelectasis and bandlike scarring in LLL CT Chest 01/22/24 - Slightly improved infiltrates especially in the bases compared to 07/23/23 and significantly improved compared to CTA 05/08/21 in LLL infiltrate CT Chest 07/01/24 - Bandlike scarring in right middle and right lower lobe, tree in bud present  PFT: 01/26/23 FVC 2.41 (72%) FEV1 1.53 (61%) Ratio 64  TLC 121% TV 178% RV/TLC 141% DLCO 101% Interpretation: Moderate obstructive defect with significant bronchodilator response in FVC. Hyperinflation and air trapping present.  Labs: Alpha 1-antitrypsin MZ - 91 (LLN 101) Immunoglobulins G/A/M - normal range Eosinophil 05/22/21 - 0    Assessment & Plan:   Discussion: 78 year old female former with COPD-asthma overlap and recurrent exacerbations +/- pneumonia 2-3 times annually that are solve to resolve. Had 6 month period with no exacerbations from 12/2022-05/2023. Recent CT imaging including 07/01/24 demonstrate resolved LLL  infiltrate but now involvement of RML and RLL. Suspect the changes in her CT are related to silent aspiration however swallow evaluation negative. Does not rule out microaspirations. Recent CT 08/2024 with improved infiltrate. Will optimize reflux management. Addressed  questions and concerns  COPD-asthma overlap  --CONTINUE Trelegy 200-62.5-25 mcg ONE puff ONCE a day. --CONTINUE flutter valve twice a day AS NEEDED --CONTINUE Xopenex  nebulizers twice a day followed by flutter valve. --CONTINUE singulair  10 mg daily --OK to take prednisone  20 mg ( 1 pill ) x 5 days for symptoms including worsening productive cough, shortness of breath and wheezing that has been present for 48 hours  Hx recurrent pneumonia Reflux --Reviewed CT 01/22/24 with overall improved LLL infiltrate compared to 2022 --Reviewed CT Chest 09/13/24 overall improved infiltrate with bilateral scarring. No bronchoscopy indicated --Swallow evaluation 09/20/24 No aspiration --Change omeprazole 20 mg daily to esomeprazole  40 mg daily. Please notify your PCP of the change to discuss at your visit next spring. May need to recheck your thyroid levels   **If needs CT need in the future, she has used alprazolam  1-2 mg PRN for anxiety**  Health Maintenance Immunization History  Administered Date(s) Administered   Fluzone Influenza virus vaccine,trivalent (IIV3), split virus 09/28/2013   INFLUENZA, HIGH DOSE SEASONAL PF 10/17/2016, 09/15/2017, 08/29/2021, 08/21/2022   Influenza Inj Mdck Quad Pf 10/21/2013, 09/01/2019, 09/10/2020   Influenza Split 10/16/2004, 11/08/2009   Influenza, Seasonal, Injecte, Preservative Fre 09/23/2012   Influenza,inj,Quad PF,6+ Mos 09/18/2014, 09/21/2015   Influenza,inj,quad, With Preservative 08/31/2017, 10/07/2017, 09/07/2018, 09/02/2019   Influenza-Unspecified 10/21/2013   Moderna Sars-Covid-2 Vaccination 12/20/2019, 01/17/2020, 10/08/2020, 04/02/2021   PNEUMOCOCCAL CONJUGATE-20 02/19/2023   Pfizer Covid-19 Vaccine Bivalent Booster 62yrs & up 09/29/2021   Pfizer(Comirnaty )Fall Seasonal Vaccine 12 years and older 12/04/2022, 08/15/2024   Pneumococcal Conjugate-13 05/28/2015, 05/27/2016   Pneumococcal Polysaccharide-23 12/21/2008, 12/01/2013, 05/31/2014   Tdap  04/03/2017, 02/19/2023   Zoster, Live 12/01/2013   CT Lung Screen - not qualified due to frequent CT  No orders of the defined types were placed in this encounter.  Meds ordered this encounter  Medications   esomeprazole  (NEXIUM ) 40 MG capsule    Sig: Take 1 capsule (40 mg total) by mouth daily at 12 noon.    Dispense:  90 capsule    Refill:  3   montelukast  (SINGULAIR ) 10 MG tablet    Sig: Take 1 tablet (10 mg total) by mouth at bedtime.    Dispense:  90 tablet    Refill:  3   levalbuterol  (XOPENEX ) 0.63 MG/3ML nebulizer solution    Sig: Take 3 mLs (0.63 mg total) by nebulization every 6 (six) hours as needed for wheezing or shortness of breath.    Dispense:  360 mL    Refill:  5    Dx J44.89   levalbuterol  (XOPENEX  HFA) 45 MCG/ACT inhaler    Sig: Inhale 2 puffs into the lungs 4 (four) times daily as needed for shortness of breath or wheezing.    Dispense:  1 each    Refill:  5   Return in about 6 months (around 04/23/2025).  I have spent a total time of 31-minutes on the day of the appointment including chart review, data review, collecting history, coordinating care and discussing medical diagnosis and plan with the patient/family. Past medical history, allergies, medications were reviewed. Pertinent imaging, labs and tests included in this note have been reviewed and interpreted independently by me.  Dominik Yordy Slater Staff, MD [facility] 10/24/2024 4:43 PM

## 2024-10-24 NOTE — Patient Instructions (Signed)
 COPD-asthma overlap  --CONTINUE Trelegy 200-62.5-25 mcg ONE puff ONCE a day. --CONTINUE flutter valve twice a day AS NEEDED --CONTINUE Xopenex  nebulizers twice a day followed by flutter valve. --CONTINUE singulair  10 mg daily --OK to take prednisone  20 mg ( 1 pill ) x 5 days for symptoms including worsening productive cough, shortness of breath and wheezing that has been present for 48 hours  Hx recurrent pneumonia High risk for aspiration --Reviewed CT 01/22/24 with overall improved LLL infiltrate compared to 2022 --Reviewed CT Chest 09/13/24 overall improved infiltrate with bilateral scarring. No bronchoscopy indicated --Swallow evaluation 09/20/24 No aspiration --Change omeprazole 20 mg daily to esomeprazole  40 mg daily. Please notify your PCP of the change to discuss at your visit next spring. May need to recheck your thyroid levels

## 2024-11-18 ENCOUNTER — Encounter (HOSPITAL_BASED_OUTPATIENT_CLINIC_OR_DEPARTMENT_OTHER): Payer: Self-pay | Admitting: Pulmonary Disease

## 2024-11-20 MED ORDER — PREDNISONE 10 MG PO TABS: 20.0000 mg | ORAL_TABLET | Freq: Every day | ORAL | 0 refills | Status: AC

## 2024-11-28 ENCOUNTER — Encounter (HOSPITAL_BASED_OUTPATIENT_CLINIC_OR_DEPARTMENT_OTHER): Payer: Self-pay | Admitting: Pulmonary Disease

## 2024-11-28 DIAGNOSIS — J441 Chronic obstructive pulmonary disease with (acute) exacerbation: Secondary | ICD-10-CM

## 2024-11-28 MED ORDER — AZITHROMYCIN 250 MG PO TABS: ORAL_TABLET | ORAL | 0 refills | Status: AC

## 2024-11-28 MED ORDER — PREDNISONE 10 MG PO TABS: 10.0000 mg | ORAL_TABLET | Freq: Every day | ORAL | 0 refills | Status: AC

## 2024-11-28 NOTE — Telephone Encounter (Signed)
 Called patient. She has wheezing, chest tightness, coughing up purulent sputum. Has a hx of asthma copd overlap. She has been given prednisone  with improvement but it rebounded after stopping prednisone . I prescribed prednisone  dose pack and Z pack. I also advised patient to use nebulizer more frequently. She will continue with current respiratory medications as prescribed by Dr. Kassie. I told her to update us  in a week's time.

## 2024-11-28 NOTE — Telephone Encounter (Signed)
 Please advise
